# Patient Record
Sex: Female | Born: 1980 | Race: White | Hispanic: No | Marital: Single | Smoking: Former smoker
Health system: Southern US, Community
[De-identification: ages and names within clinical notes are randomized; demographics above are authoritative.]

## PROBLEM LIST (undated history)

## (undated) ENCOUNTER — Inpatient Hospital Stay (HOSPITAL_COMMUNITY): Payer: Self-pay

## (undated) DIAGNOSIS — F419 Anxiety disorder, unspecified: Secondary | ICD-10-CM

## (undated) DIAGNOSIS — J45909 Unspecified asthma, uncomplicated: Secondary | ICD-10-CM

## (undated) HISTORY — PX: BREAST REDUCTION SURGERY: SHX8

---
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
## 2001-02-03 ENCOUNTER — Encounter: Payer: Self-pay | Admitting: Emergency Medicine

## 2001-02-03 ENCOUNTER — Emergency Department (HOSPITAL_COMMUNITY): Admission: EM | Admit: 2001-02-03 | Discharge: 2001-02-03 | Payer: Self-pay | Admitting: Emergency Medicine

## 2001-02-20 ENCOUNTER — Other Ambulatory Visit: Admission: RE | Admit: 2001-02-20 | Discharge: 2001-02-20 | Payer: Self-pay | Admitting: Obstetrics and Gynecology

## 2001-06-17 ENCOUNTER — Inpatient Hospital Stay (HOSPITAL_COMMUNITY): Admission: AD | Admit: 2001-06-17 | Discharge: 2001-06-20 | Payer: Self-pay | Admitting: Obstetrics & Gynecology

## 2001-06-18 ENCOUNTER — Encounter: Payer: Self-pay | Admitting: Obstetrics & Gynecology

## 2001-09-10 ENCOUNTER — Inpatient Hospital Stay (HOSPITAL_COMMUNITY): Admission: AD | Admit: 2001-09-10 | Discharge: 2001-09-10 | Payer: Self-pay | Admitting: Obstetrics & Gynecology

## 2001-09-11 ENCOUNTER — Inpatient Hospital Stay (HOSPITAL_COMMUNITY): Admission: AD | Admit: 2001-09-11 | Discharge: 2001-09-13 | Payer: Self-pay | Admitting: Obstetrics & Gynecology

## 2001-12-31 ENCOUNTER — Encounter: Payer: Self-pay | Admitting: Obstetrics and Gynecology

## 2001-12-31 ENCOUNTER — Inpatient Hospital Stay (HOSPITAL_COMMUNITY): Admission: AD | Admit: 2001-12-31 | Discharge: 2001-12-31 | Payer: Self-pay | Admitting: Obstetrics and Gynecology

## 2002-01-02 ENCOUNTER — Inpatient Hospital Stay (HOSPITAL_COMMUNITY): Admission: AD | Admit: 2002-01-02 | Discharge: 2002-01-02 | Payer: Self-pay | Admitting: *Deleted

## 2002-05-19 ENCOUNTER — Encounter: Admission: RE | Admit: 2002-05-19 | Discharge: 2002-05-19 | Payer: Self-pay | Admitting: *Deleted

## 2004-02-07 ENCOUNTER — Inpatient Hospital Stay (HOSPITAL_COMMUNITY): Admission: AD | Admit: 2004-02-07 | Discharge: 2004-02-07 | Payer: Self-pay | Admitting: Obstetrics & Gynecology

## 2004-03-07 ENCOUNTER — Inpatient Hospital Stay (HOSPITAL_COMMUNITY): Admission: AD | Admit: 2004-03-07 | Discharge: 2004-03-07 | Payer: Self-pay | Admitting: *Deleted

## 2004-08-18 ENCOUNTER — Inpatient Hospital Stay (HOSPITAL_COMMUNITY): Admission: AD | Admit: 2004-08-18 | Discharge: 2004-08-18 | Payer: Self-pay | Admitting: Obstetrics & Gynecology

## 2005-01-08 ENCOUNTER — Inpatient Hospital Stay (HOSPITAL_COMMUNITY): Admission: AD | Admit: 2005-01-08 | Discharge: 2005-01-08 | Payer: Self-pay | Admitting: *Deleted

## 2005-01-12 ENCOUNTER — Inpatient Hospital Stay (HOSPITAL_COMMUNITY): Admission: AD | Admit: 2005-01-12 | Discharge: 2005-01-12 | Payer: Self-pay | Admitting: *Deleted

## 2005-03-13 ENCOUNTER — Emergency Department (HOSPITAL_COMMUNITY): Admission: EM | Admit: 2005-03-13 | Discharge: 2005-03-13 | Payer: Self-pay | Admitting: Emergency Medicine

## 2005-04-02 ENCOUNTER — Inpatient Hospital Stay (HOSPITAL_COMMUNITY): Admission: AD | Admit: 2005-04-02 | Discharge: 2005-04-03 | Payer: Self-pay | Admitting: Family Medicine

## 2005-09-23 ENCOUNTER — Inpatient Hospital Stay (HOSPITAL_COMMUNITY): Admission: AD | Admit: 2005-09-23 | Discharge: 2005-09-23 | Payer: Self-pay | Admitting: Obstetrics and Gynecology

## 2005-12-09 ENCOUNTER — Inpatient Hospital Stay (HOSPITAL_COMMUNITY): Admission: AD | Admit: 2005-12-09 | Discharge: 2005-12-09 | Payer: Self-pay | Admitting: Obstetrics & Gynecology

## 2006-04-08 ENCOUNTER — Inpatient Hospital Stay (HOSPITAL_COMMUNITY): Admission: AD | Admit: 2006-04-08 | Discharge: 2006-04-08 | Payer: Self-pay | Admitting: Obstetrics & Gynecology

## 2006-04-09 ENCOUNTER — Emergency Department (HOSPITAL_COMMUNITY): Admission: EM | Admit: 2006-04-09 | Discharge: 2006-04-09 | Payer: Self-pay | Admitting: Emergency Medicine

## 2006-05-08 ENCOUNTER — Emergency Department (HOSPITAL_COMMUNITY): Admission: EM | Admit: 2006-05-08 | Discharge: 2006-05-08 | Payer: Self-pay | Admitting: Family Medicine

## 2006-07-15 ENCOUNTER — Inpatient Hospital Stay (HOSPITAL_COMMUNITY): Admission: AD | Admit: 2006-07-15 | Discharge: 2006-07-15 | Payer: Self-pay | Admitting: Obstetrics & Gynecology

## 2006-07-23 ENCOUNTER — Inpatient Hospital Stay (HOSPITAL_COMMUNITY): Admission: AD | Admit: 2006-07-23 | Discharge: 2006-07-23 | Payer: Self-pay | Admitting: Obstetrics and Gynecology

## 2006-10-02 ENCOUNTER — Emergency Department (HOSPITAL_COMMUNITY): Admission: EM | Admit: 2006-10-02 | Discharge: 2006-10-02 | Payer: Self-pay | Admitting: Emergency Medicine

## 2006-10-08 ENCOUNTER — Emergency Department (HOSPITAL_COMMUNITY): Admission: EM | Admit: 2006-10-08 | Discharge: 2006-10-08 | Payer: Self-pay | Admitting: Family Medicine

## 2007-01-14 ENCOUNTER — Emergency Department: Payer: Self-pay | Admitting: Internal Medicine

## 2007-05-14 ENCOUNTER — Emergency Department (HOSPITAL_COMMUNITY): Admission: EM | Admit: 2007-05-14 | Discharge: 2007-05-14 | Payer: Self-pay | Admitting: Family Medicine

## 2008-08-30 ENCOUNTER — Emergency Department (HOSPITAL_COMMUNITY): Admission: EM | Admit: 2008-08-30 | Discharge: 2008-08-30 | Payer: Self-pay | Admitting: Family Medicine

## 2008-08-31 ENCOUNTER — Inpatient Hospital Stay (HOSPITAL_COMMUNITY): Admission: AD | Admit: 2008-08-31 | Discharge: 2008-08-31 | Payer: Self-pay | Admitting: Obstetrics & Gynecology

## 2008-09-03 ENCOUNTER — Inpatient Hospital Stay (HOSPITAL_COMMUNITY): Admission: AD | Admit: 2008-09-03 | Discharge: 2008-09-03 | Payer: Self-pay | Admitting: Obstetrics & Gynecology

## 2008-10-04 ENCOUNTER — Emergency Department (HOSPITAL_COMMUNITY): Admission: EM | Admit: 2008-10-04 | Discharge: 2008-10-04 | Payer: Self-pay | Admitting: Family Medicine

## 2008-10-06 ENCOUNTER — Emergency Department (HOSPITAL_COMMUNITY): Admission: EM | Admit: 2008-10-06 | Discharge: 2008-10-06 | Payer: Self-pay | Admitting: Emergency Medicine

## 2008-11-06 ENCOUNTER — Emergency Department (HOSPITAL_COMMUNITY): Admission: EM | Admit: 2008-11-06 | Discharge: 2008-11-07 | Payer: Self-pay | Admitting: Emergency Medicine

## 2008-12-20 ENCOUNTER — Emergency Department (HOSPITAL_COMMUNITY): Admission: EM | Admit: 2008-12-20 | Discharge: 2008-12-20 | Payer: Self-pay | Admitting: Family Medicine

## 2009-04-29 ENCOUNTER — Emergency Department (HOSPITAL_COMMUNITY): Admission: EM | Admit: 2009-04-29 | Discharge: 2009-04-29 | Payer: Self-pay | Admitting: Family Medicine

## 2009-05-10 ENCOUNTER — Emergency Department (HOSPITAL_COMMUNITY): Admission: EM | Admit: 2009-05-10 | Discharge: 2009-05-10 | Payer: Self-pay | Admitting: Family Medicine

## 2009-11-07 ENCOUNTER — Encounter: Admission: RE | Admit: 2009-11-07 | Discharge: 2009-11-07 | Payer: Self-pay | Admitting: Family Medicine

## 2010-04-25 ENCOUNTER — Encounter: Admission: RE | Admit: 2010-04-25 | Discharge: 2010-04-25 | Payer: Self-pay | Admitting: Family Medicine

## 2010-04-27 ENCOUNTER — Emergency Department (HOSPITAL_COMMUNITY): Admission: EM | Admit: 2010-04-27 | Discharge: 2010-04-27 | Payer: Self-pay | Admitting: Emergency Medicine

## 2010-04-30 ENCOUNTER — Emergency Department (HOSPITAL_COMMUNITY): Admission: EM | Admit: 2010-04-30 | Discharge: 2010-04-30 | Payer: Self-pay | Admitting: Family Medicine

## 2010-05-11 ENCOUNTER — Emergency Department (HOSPITAL_COMMUNITY): Admission: EM | Admit: 2010-05-11 | Discharge: 2010-05-11 | Payer: Self-pay | Admitting: Emergency Medicine

## 2010-08-13 HISTORY — PX: BREAST BIOPSY: SHX20

## 2010-08-28 ENCOUNTER — Encounter
Admission: RE | Admit: 2010-08-28 | Discharge: 2010-08-28 | Payer: Self-pay | Source: Home / Self Care | Attending: Family Medicine | Admitting: Family Medicine

## 2010-11-21 LAB — WET PREP, GENITAL
Trich, Wet Prep: NONE SEEN
Yeast Wet Prep HPF POC: NONE SEEN

## 2010-11-21 LAB — GC/CHLAMYDIA PROBE AMP, GENITAL
Chlamydia, DNA Probe: NEGATIVE
GC Probe Amp, Genital: NEGATIVE

## 2010-11-21 LAB — POCT URINALYSIS DIP (DEVICE)
Bilirubin Urine: NEGATIVE
Glucose, UA: NEGATIVE mg/dL
Ketones, ur: NEGATIVE mg/dL
Nitrite: NEGATIVE

## 2010-11-27 LAB — GC/CHLAMYDIA PROBE AMP, GENITAL: GC Probe Amp, Genital: NEGATIVE

## 2010-11-27 LAB — POCT URINALYSIS DIP (DEVICE)
Bilirubin Urine: NEGATIVE
Glucose, UA: NEGATIVE mg/dL
Protein, ur: 30 mg/dL — AB
Specific Gravity, Urine: 1.015 (ref 1.005–1.030)
Urobilinogen, UA: 1 mg/dL (ref 0.0–1.0)
pH: 7 (ref 5.0–8.0)

## 2010-11-27 LAB — RPR: RPR Ser Ql: NONREACTIVE

## 2010-11-27 LAB — HCG, QUANTITATIVE, PREGNANCY: hCG, Beta Chain, Quant, S: 35 m[IU]/mL — ABNORMAL HIGH (ref <5)

## 2010-11-28 LAB — STREP A DNA PROBE

## 2010-12-29 NOTE — Discharge Summary (Signed)
[facility]  Patient:    Michelle Rhodes, Michelle Rhodes Visit Number: [id number] MRN: [id number]          Service Type: OBS Location: [id number] Attending Physician:  Mickle Mallory Dictated by:   Gerrit Friends. Aldona Bar, M.D. Admit Date:  06/17/2001 Discharge Date: 06/20/2001                             Discharge Summary  DISCHARGE DIAGNOSES: 1. A 26 week intrauterine pregnancy, undelivered. 2. Right pyelonephritis, resolved.  HISTORY OF PRESENT ILLNESS:  This 30 year old, gravida 1, para 0, was admitted at [redacted] weeks gestation with right costoverebral angle tenderness, worse over the preceding three days, with an infected appearing urine, and a presumptive diagnosis of right pyelonephritis.  She was admitted and placed on intravenous Ancef.  Her initial white count was 17,800, and her urine was grossly infected, and a culture was requested.  Unfortunately, the culture results never made it back to the chart, and in spite of multiple inquiries from the laboratory, the culture has not been able to be located.  Nonetheless, the patient did improve clinically.  Her white count dropped to 10,000 on the morning of 11/7, and her differential likewise improved, initially there was a shift to the left, and on 06/19/01, the differential was normal.  An ultrasound was done which revealed the pregnancy to be doing well, sizes were equivalent to dates, and amniotic fluid volume was normal.  The kidneys were looked at during the ultrasound, and the ultrasonography had the impression that there was a moderate hydronephrosis on the right greater than expected for 26 weeks, and no right ureteral jet was seen, suggesting the possibility of a right ureteral stone or obstruction.  There was no hydronephrosis noted on the left.  Dr. Isabel Caprice was consulted by Dr. Edward Jolly. Being that the patient was improving and was stable, labs were checked and the BUN and creatinine were noted to be  normal again, and the patient continued to improve, and therefore no further workup was deemed necessary according to the phone conversation with Dr. Isabel Caprice that Dr. Edward Jolly had with him on 06/18/01.  On the morning of 11/8, the patient related that her pain was almost entirely gone, she was afebrile, her vital signs were stable, and she was definitely ready for discharge.  Accordingly, she was given a prescription for Keflex 500 mg t.i.d. x 7 days.  It was requested of her to drink plenty of fluids, and return to the office in approximately 4 to 5 days for further evaluation, or to the hospital if something should change adversely in the meantime.  Once she is evaluated again in the office, consideration will be given to keeping her on antibiotics, probably Macrobid, probably for the rest of her pregnancy.  CONDITION ON DISCHARGE:  Improved. Dictated by:   Gerrit Friends. Aldona Bar, M.D. Attending Physician:  Mickle Mallory DD:  06/20/01 TD:  06/22/01 Job: [id number]

## 2010-12-29 NOTE — Op Note (Signed)
[facility]  Patient:    GRIER, CZERWINSKI Visit Number: [id number] MRN: [id number]          Service Type: OBS Location: [facility] Attending Physician:  Mickle Mallory Dictated by:   Devoria Albe Edward Jolly, M.D. Proc. Date: 09/11/01 Admit Date:  09/10/2001 Discharge Date: 09/10/2001                             Operative Report  PREOPERATIVE DIAGNOSES:       1. Intrauterine gestation at 38+4 weeks.                               2. Moderate to severe variable decelerations.  POSTOPERATIVE DIAGNOSES:      1. Intrauterine gestation at 38+4 weeks.                               2. Moderate to severe fetal variable                                  decelerations.  SURGEON:                      Brook A. Edward Jolly, M.D.  ANESTHESIA:                   Epidural, local with 1% lidocaine.  ESTIMATED BLOOD LOSS:         Less than 500 cc.  COMPLICATIONS:                None.  INDICATIONS:                  The patient was a 30 year old gravida 4, para 0-0-3-0 Caucasian female at 38+ [redacted] weeks gestation who was admitted in early labor on the morning of September 11, 2001 with a cervix that was 1 cm dilated and 100% effaced.  Shortly after admission an IV was started and the patient went on to then receive Stadol 1 mg intravenously for discomfort related to contractions which were noted to be approximately every 10 minutes.  The patients cervix was later examined and found to be 1-2 cm dilated with again the effacement at 100% and the fetal heart rate tracing was reassuring at this time with reactivity.  There was scalp stimulation appreciated.  There was a suggestion of a course ______ pattern of the fetal heart rate pattern noted after the administration of the Stadol.  The patients contractions were irregular at this time and of increased strength.  Artificial rupture of membranes was performed for augmentation of labor and for placement of an internal fetal scalp  electrode.  The amniotic fluid was noted to be clear.  The patient went on to receive an epidural for anesthesia and she rapidly progressed to the active phase of labor and 7 cm of cervical dilatation.  The patient then went on to achieve complete dilation of the cervix at which time she began to push with good maternal effort.  The patient was able to bring the fetal vertex down to a +3 station at which time moderate to severe fetal variable decelerations were appreciated and a recommendation was made to proceed with a vacuum assisted vaginal delivery.  Risks and benefits were reviewed and  the patient and her family chose to proceed.  FINDINGS:                     Viable female infant was delivered at 1337 in the occiput anterior position with Apgars of 9 at one minute and 9 at five minutes.  The infant was noted to be vigorous at birth.  There was a false knot appreciated in the umbilical cord which had a normal insertion.  The umbilical cord had three vessels.  The placenta was noted to be intact.  The cervix and vagina had no lacerations.  There was a midline episiotomy.  PROCEDURE:                     The patient was examined and the vertex was found to be in the occiput anterior position at the 3+ station.  With the bladder just previously emptied, the MityVac was placed over the fetal vertex. The vertex was delivered over three contractions and the pressure was maintained within the proper range of 15 mmHg only during the maternal efforts with pushing.  The vacuum was released completely between contractions.  The fetal vertex was delivered without difficulty followed by the remainder of the infant.  The cord was doubly clamped and cut after the nares and mouth were suctioned.  The newborn was carried over to the warmer in vigorous condition at this time.  Cord blood was obtained.  The episiotomy repair was performed in standard fashion with a combination of 2-0 and 0 Vicryl after  local injection with 1% Marcaine.  The placenta was spontaneously delivered.  The patient did receive Pitocin 20 mg intravenously.  There were no complications to the procedure.  All needle, instrument, and sponge counts were correct. Dictated by:   Devoria Albe Edward Jolly, M.D. Attending Physician:  Mickle Mallory DD:  09/11/01 TD:  09/12/01 Job: [id number]

## 2011-12-27 IMAGING — US US PELVIS COMPLETE
1 series · 14 of 25 positions shown · non-contrast
Comparison: None

CLINICAL DATA: Left lower quadrant pain.

TRANSABDOMINAL AND TRANSVAGINAL ULTRASOUND OF PELVIS
TECHNIQUE: Both transabdominal and transvaginal ultrasound
examinations of the pelvis were performed including evaluation of
the uterus, ovaries, adnexal regions, and pelvic cul-de-sac.

[Series 1: us pelvis complete · 0.23mm/px · 14 of 55 slices shown]
[im 1/55]
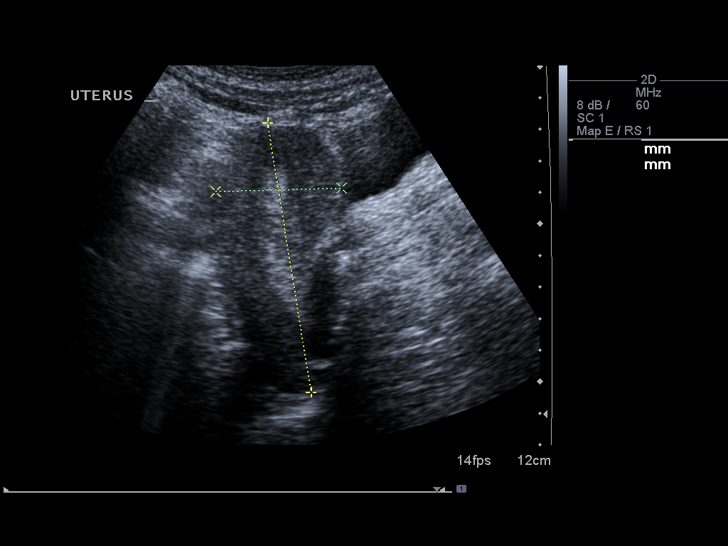
[im 5/55]
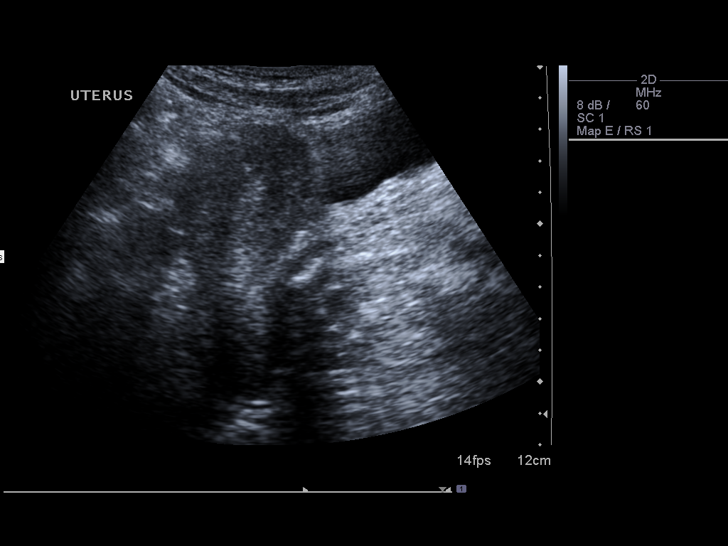
[im 10/55]
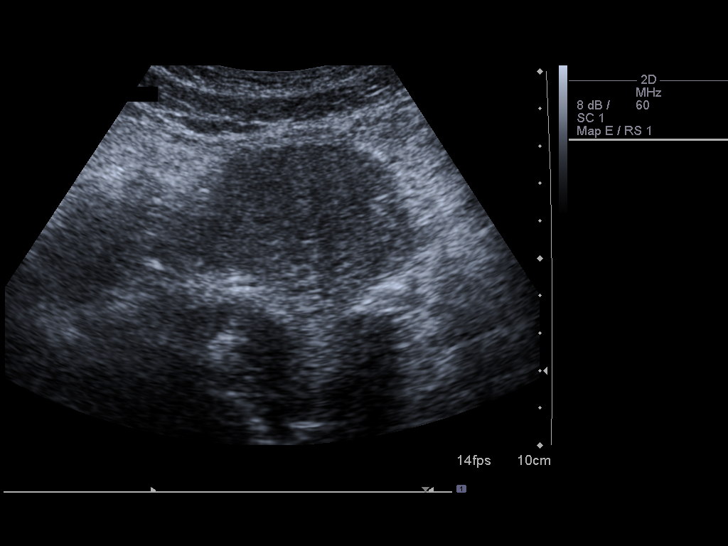
[im 14/55]
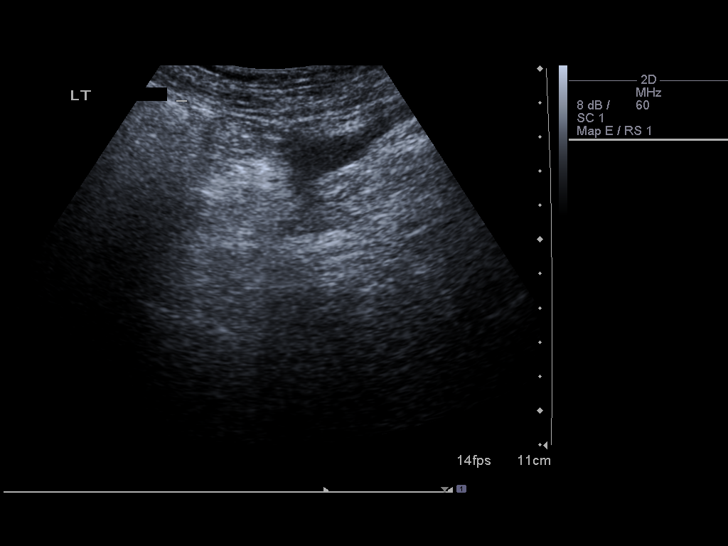
[im 19/55]
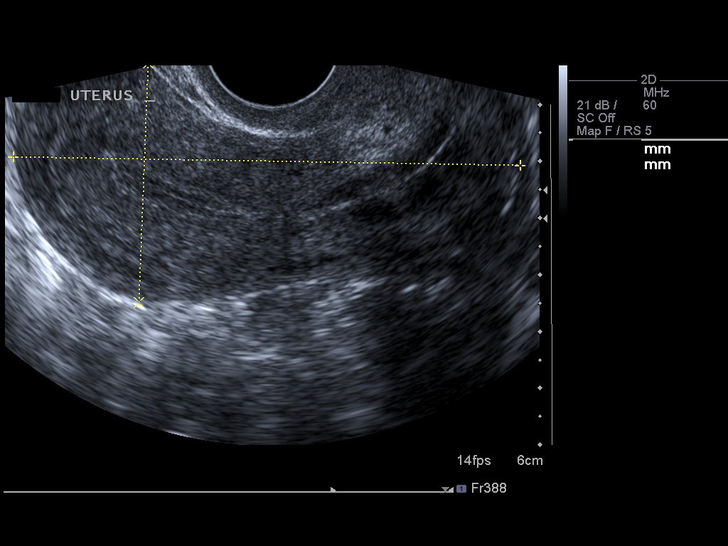
[im 21/55]
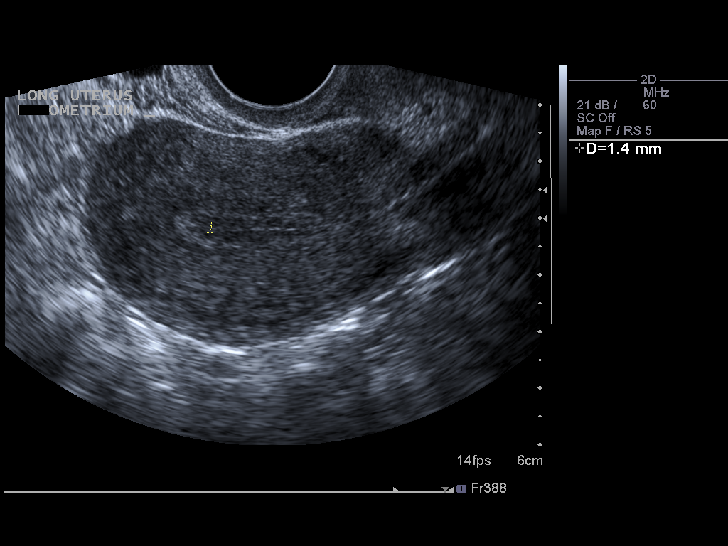
[im 25/55]
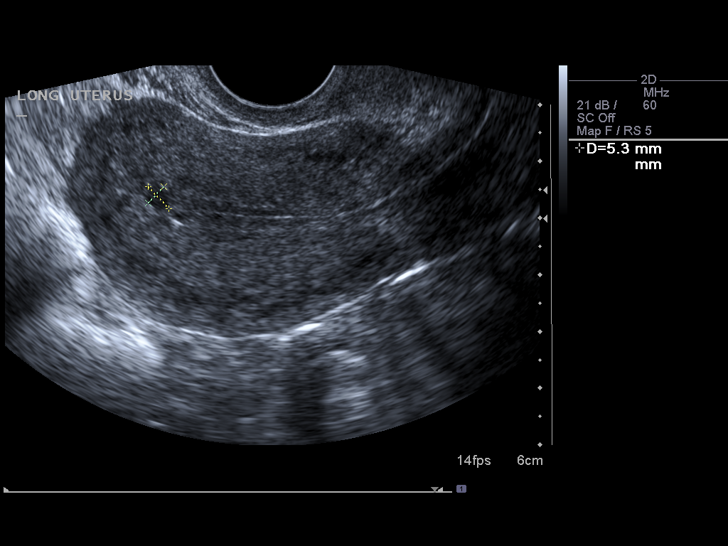
[im 30/55]
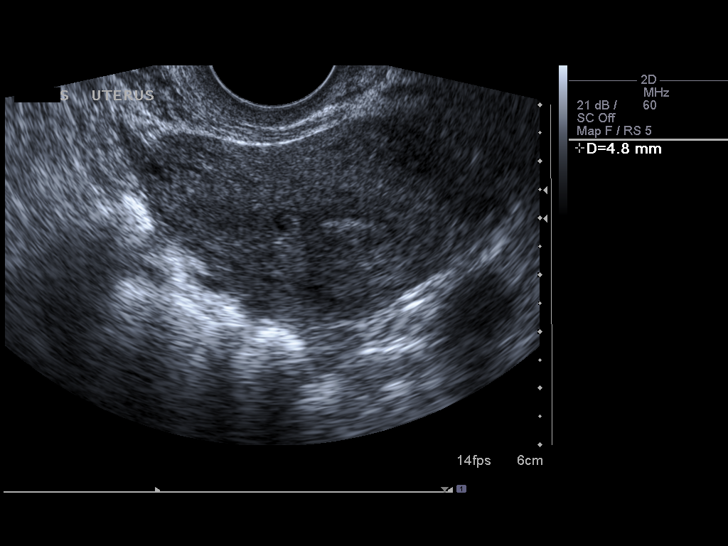
[im 34/55]
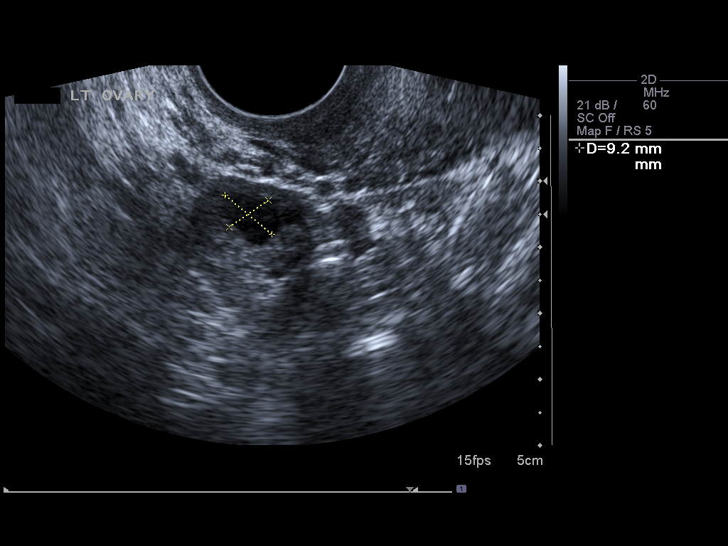
[im 37/55]
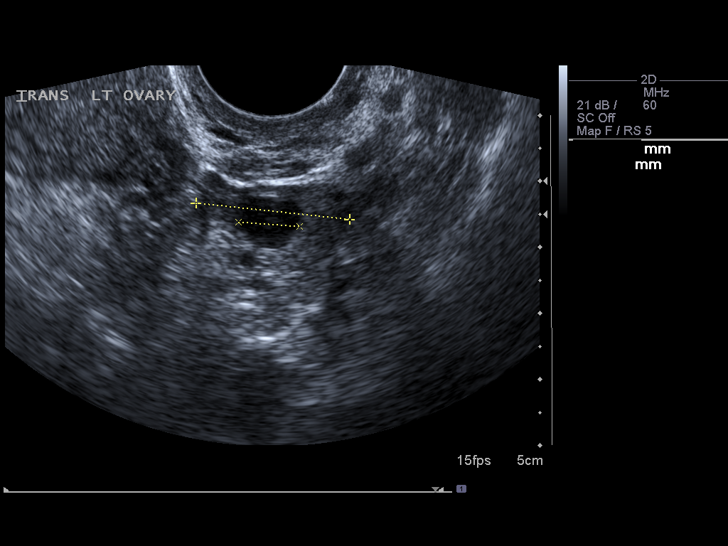
[im 41/55]
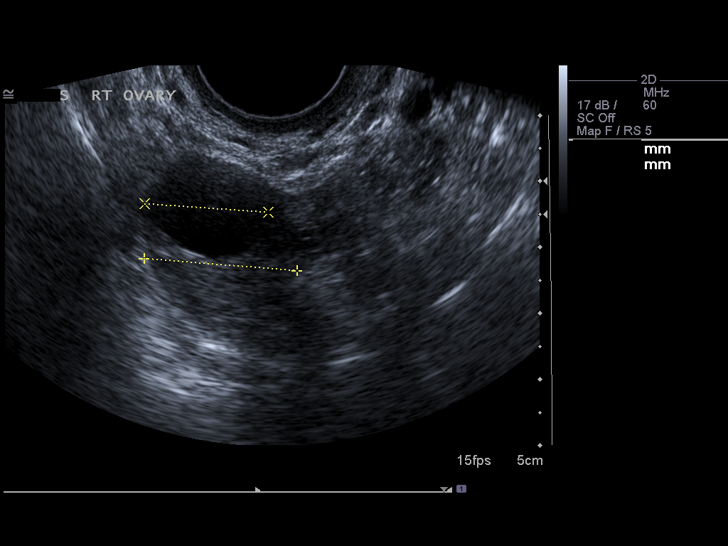
[im 46/55]
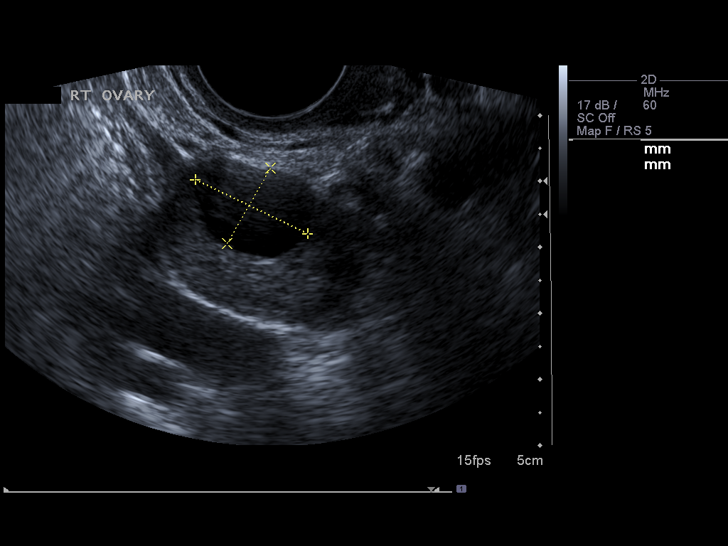
[im 50/55]
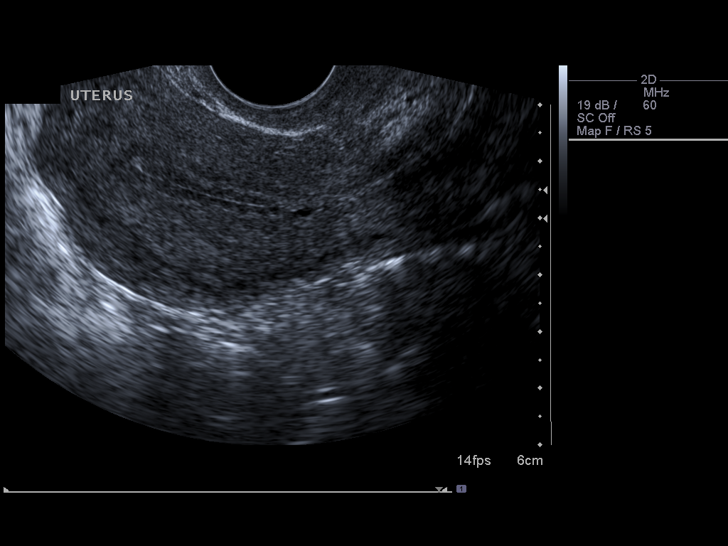
[im 55/55]
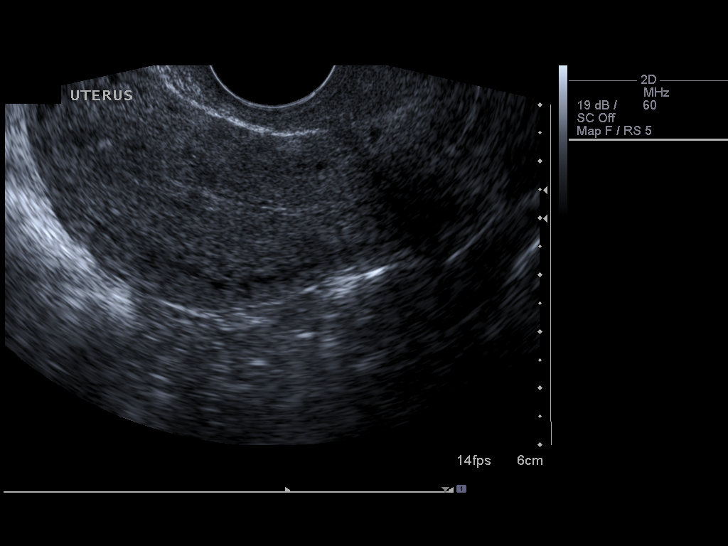

[14 of 25 positions shown; findings below may reference images not displayed]

FINDINGS: Uterus: 8.7 x 4.0 x 4.9.  Tiny hypoechoic fibroid measuring 5 mm.
Uterine echotexture is unremarkable.

Endometrium: The endometrium, 14 mm.

Right Ovary: 3.3 x 2.4 x 2.3 cm.  1.9 cm follicle present.

Left Ovary: 1.8 x 1.8 x 2.3 cm.  Small follicle, less than 1 cm.

Other Findings:  No free fluid.
IMPRESSION: Tiny submucosal fibroid, 5 mm.

Bilateral ovarian follicles.

No acute findings.

## 2012-01-02 ENCOUNTER — Encounter (HOSPITAL_COMMUNITY): Payer: Self-pay | Admitting: *Deleted

## 2012-01-02 ENCOUNTER — Emergency Department (HOSPITAL_COMMUNITY)
Admission: EM | Admit: 2012-01-02 | Discharge: 2012-01-02 | Disposition: A | Discharge status: Home / Self Care | Payer: Medicaid Other | Attending: Emergency Medicine | Admitting: Emergency Medicine

## 2012-01-02 DIAGNOSIS — F17200 Nicotine dependence, unspecified, uncomplicated: Secondary | ICD-10-CM | POA: Insufficient documentation

## 2012-01-02 DIAGNOSIS — G35 Multiple sclerosis: Secondary | ICD-10-CM | POA: Insufficient documentation

## 2012-01-02 DIAGNOSIS — G43909 Migraine, unspecified, not intractable, without status migrainosus: Secondary | ICD-10-CM | POA: Insufficient documentation

## 2012-01-02 MED ORDER — PROMETHAZINE HCL 25 MG/ML IJ SOLN
25.0000 mg | Freq: Once | INTRAMUSCULAR | Status: AC
  Administered 2012-01-02: 25 mg via INTRAMUSCULAR
  Filled 2012-01-02: qty 1

## 2012-01-02 MED ORDER — SUMATRIPTAN SUCCINATE 6 MG/0.5ML ~~LOC~~ SOLN
6.0000 mg | Freq: Once | SUBCUTANEOUS | Status: AC
  Administered 2012-01-02: 6 mg via SUBCUTANEOUS
  Filled 2012-01-02: qty 0.5

## 2012-01-02 MED ORDER — SUMATRIPTAN SUCCINATE 6 MG/0.5ML ~~LOC~~ SOLN: 6.0000 mg | SUBCUTANEOUS | Status: DC | PRN

## 2012-01-02 NOTE — ED Notes (Signed)
Patient reports she has hx of migraines,  She has had a headache today since 0800.  She has taken advil and bc w/o relief.

## 2012-01-02 NOTE — Discharge Instructions (Signed)
Get plenty of rest. Follow up with your doctor. Return if worsening.   Migraine Headache A migraine headache is an intense, throbbing pain on one or both sides of your head. The exact cause of a migraine headache is not always known. A migraine may be caused when nerves in the brain become irritated and release chemicals that cause swelling within blood vessels, causing pain. Many migraine sufferers have a family history of migraines. Before you get a migraine you may or may not get an aura. An aura is a group of symptoms that can predict the beginning of a migraine. An aura may include:  Visual changes such as:   Flashing lights.   Bright spots or zig-zag lines.   Tunnel vision.   Feelings of numbness.   Trouble talking.   Muscle weakness.  SYMPTOMS  Pain on one or both sides of your head.   Pain that is pulsating or throbbing in nature.   Pain that is severe enough to prevent daily activities.   Pain that is aggravated by any daily physical activity.   Nausea (feeling sick to your stomach), vomiting, or both.   Pain with exposure to bright lights, loud noises, or activity.   General sensitivity to bright lights or loud noises.  MIGRAINE TRIGGERS Examples of triggers of migraine headaches include:   Alcohol.   Smoking.   Stress.   It may be related to menses (female menstruation).   Aged cheeses.   Foods or drinks that contain nitrates, glutamate, aspartame, or tyramine.   Lack of sleep.   Chocolate.   Caffeine.   Hunger.   Medications such as nitroglycerine (used to treat chest pain), birth control pills, estrogen, and some blood pressure medications.  DIAGNOSIS  A migraine headache is often diagnosed based on:  Symptoms.   Physical examination.   A computerized X-ray scan (computed tomography, CT) of your head.  TREATMENT  Medications can help prevent migraines if they are recurrent or should they become recurrent. Your caregiver can help you with  a medication or treatment program that will be helpful to you.   Lying down in a dark, quiet room may be helpful.   Keeping a headache diary may help you find a trend as to what may be triggering your headaches.  SEEK IMMEDIATE MEDICAL CARE IF:   You have confusion, personality changes or seizures.   You have headaches that wake you from sleep.   You have an increased frequency in your headaches.   You have a stiff neck.   You have a loss of vision.   You have muscle weakness.   You start losing your balance or have trouble walking.   You feel faint or pass out.  MAKE SURE YOU:   Understand these instructions.   Will watch your condition.   Will get help right away if you are not doing well or get worse.  Document Released: 07/30/2005 Document Revised: 07/19/2011 Document Reviewed: 03/15/2009 [facility] Patient Information 2012 [place], [place].

## 2012-01-02 NOTE — ED Provider Notes (Signed)
Medical screening examination/treatment/procedure(s) were performed by non-physician practitioner and as supervising physician I was immediately available for consultation/collaboration.   Joya Gaskins, MD 01/02/12 [phone]

## 2012-01-02 NOTE — ED Notes (Signed)
Discharged home with prescription and written instructions with significant other, steady gait.

## 2012-01-02 NOTE — ED Provider Notes (Signed)
History     CSN: [id number]  Arrival date & time 01/02/12  1024   First MD Initiated Contact with Patient 01/02/12 1105      Chief Complaint  Patient presents with  . Migraine    (Consider location/radiation/quality/duration/timing/severity/associated sxs/prior treatment) Patient is a 31 y.o. female presenting with migraine. The history is provided by the patient.  Migraine This is a new problem. The current episode started today. The problem occurs constantly. The problem has been unchanged. Associated symptoms include headaches. Pertinent negatives include no chills, congestion, fatigue, fever, joint swelling, rash, sore throat or visual change. Exacerbated by: light.  Pt states she has hx of migraines, usually takes imitrex injections at home but states she ran out. This headache began this morning. Pt woke up with it. States feels like her usual migraine. Took goody headache and ibuprofen with no relief. Pt denies fever, chills, URI symptoms, weakness or numbness of extremities. Does admit to nausea.  Past Medical History  Diagnosis Date  . Migraine   . Multiple sclerosis     History reviewed. No pertinent past surgical history.  No family history on file.  History  Substance Use Topics  . Smoking status: Current Everyday Smoker  . Smokeless tobacco: Not on file  . Alcohol Use: No    OB History    Grav Para Term Preterm Abortions TAB SAB Ect Mult Living                  Review of Systems  Constitutional: Negative for fever, chills and fatigue.  HENT: Negative for ear pain, congestion, sore throat, facial swelling, neck stiffness and sinus pressure.   Eyes: Positive for photophobia. Negative for visual disturbance.  Respiratory: Negative.   Cardiovascular: Negative.   Gastrointestinal: Negative.   Musculoskeletal: Negative.  Negative for joint swelling.  Skin: Negative for rash.  Neurological: Positive for headaches. Negative for dizziness and light-headedness.      Allergies  Review of patient's allergies indicates no known allergies.  Home Medications   Current Outpatient Rx  Name Route Sig Dispense Refill  . GOODY HEADACHE PO Oral Take 1 packet by mouth daily as needed. For pain    . CETIRIZINE HCL 10 MG PO TABS Oral Take 10 mg by mouth daily.    . IBUPROFEN 200 MG PO TABS Oral Take 400 mg by mouth every 6 (six) hours as needed. For pain      BP 100/66  Pulse 66  Temp(Src) 98.3 F (36.8 C) (Oral)  Resp 16  SpO2 100%  Physical Exam  Nursing note and vitals reviewed. Constitutional: She is oriented to person, place, and time. She appears well-developed and well-nourished.       Uncomfortable appearing  HENT:  Head: Normocephalic and atraumatic.  Right Ear: External ear normal.  Left Ear: External ear normal.  Nose: Nose normal.  Mouth/Throat: Oropharynx is clear and moist.  Eyes: Conjunctivae and EOM are normal. Pupils are equal, round, and reactive to light.  Neck: Normal range of motion. Neck supple.  Cardiovascular: Normal rate, regular rhythm and normal heart sounds.   Pulmonary/Chest: Effort normal and breath sounds normal. No respiratory distress. She has no wheezes. She has no rales.  Musculoskeletal: Normal range of motion. She exhibits no tenderness.  Neurological: She is alert and oriented to person, place, and time. She has normal reflexes. No cranial nerve deficit. She exhibits normal muscle tone.  Skin: Skin is warm and dry.  Psychiatric: She has a normal mood and  affect.    ED Course  Procedures (including critical care time)  Pt with typical migraine, out of imitrex at home. Will order some here.   Pt received imitrex and phenergan. Pt feeling better, wants to be discharged home. She is neurovascularly intact. Onset of pain upon awakening. She denies any neuro deficits. No fever or neck stiffness. Will d/c home with follow up as needed    1. Migraine headache       MDM          Lottie Mussel, PA 01/02/12 1549

## 2012-01-10 ENCOUNTER — Emergency Department (HOSPITAL_COMMUNITY)
Admission: EM | Admit: 2012-01-10 | Discharge: 2012-01-10 | Disposition: A | Discharge status: Home / Self Care | Payer: Medicaid Other | Attending: Emergency Medicine | Admitting: Emergency Medicine

## 2012-01-10 ENCOUNTER — Encounter (HOSPITAL_COMMUNITY): Payer: Self-pay | Admitting: *Deleted

## 2012-01-10 DIAGNOSIS — R51 Headache: Secondary | ICD-10-CM | POA: Insufficient documentation

## 2012-01-10 DIAGNOSIS — F17200 Nicotine dependence, unspecified, uncomplicated: Secondary | ICD-10-CM | POA: Insufficient documentation

## 2012-01-10 MED ORDER — OXYCODONE-ACETAMINOPHEN 5-325 MG PO TABS
1.0000 | ORAL_TABLET | Freq: Once | ORAL | Status: AC
  Administered 2012-01-10: 1 via ORAL
  Filled 2012-01-10: qty 1

## 2012-01-10 MED ORDER — SUMATRIPTAN SUCCINATE 6 MG/0.5ML ~~LOC~~ SOLN
6.0000 mg | Freq: Once | SUBCUTANEOUS | Status: AC
  Administered 2012-01-10: 6 mg via SUBCUTANEOUS
  Filled 2012-01-10: qty 0.5

## 2012-01-10 NOTE — ED Provider Notes (Signed)
History     CSN: [id number]  Arrival date & time 01/10/12  0002   First MD Initiated Contact with Patient 01/10/12 [phone]      Chief Complaint  Patient presents with  . Migraine    (Consider location/radiation/quality/duration/timing/severity/associated sxs/prior treatment) Patient is a 31 y.o. female presenting with migraine. The history is provided by the patient (pt states she is having her typical migraine). No language interpreter was used.  Migraine This is a new problem. The current episode started 12 to 24 hours ago. The problem occurs constantly. Pertinent negatives include no chest pain, no abdominal pain and no headaches. The symptoms are aggravated by nothing. The symptoms are relieved by nothing. She has tried nothing for the symptoms. The treatment provided no relief.    Past Medical History  Diagnosis Date  . Migraine   . Multiple sclerosis     History reviewed. No pertinent past surgical history.  No family history on file.  History  Substance Use Topics  . Smoking status: Current Everyday Smoker  . Smokeless tobacco: Not on file  . Alcohol Use: No    OB History    Grav Para Term Preterm Abortions TAB SAB Ect Mult Living                  Review of Systems  Constitutional: Negative for fatigue.  HENT: Negative for congestion, sinus pressure and ear discharge.   Eyes: Negative for discharge.  Respiratory: Negative for cough.   Cardiovascular: Negative for chest pain.  Gastrointestinal: Negative for abdominal pain and diarrhea.  Genitourinary: Negative for frequency and hematuria.  Musculoskeletal: Negative for back pain.  Skin: Negative for rash.  Neurological: Negative for seizures and headaches.  Hematological: Negative.   Psychiatric/Behavioral: Negative for hallucinations.    Allergies  Review of patient's allergies indicates no known allergies.  Home Medications   Current Outpatient Rx  Name Route Sig Dispense Refill  . SUMATRIPTAN  SUCCINATE 6 MG/0.5ML Mabel SOLN Subcutaneous Inject 0.5 mLs (6 mg total) into the skin every 2 (two) hours as needed for migraine or headache. F 2 vial 0    BP 118/87  Pulse 62  Temp(Src) 98.3 F (36.8 C) (Oral)  Resp 18  SpO2 100%  Physical Exam  Constitutional: She is oriented to person, place, and time. She appears well-developed.  HENT:  Head: Normocephalic and atraumatic.  Eyes: Conjunctivae and EOM are normal. No scleral icterus.  Neck: Neck supple. No thyromegaly present.  Cardiovascular: Normal rate and regular rhythm.  Exam reveals no gallop and no friction rub.   No murmur heard. Pulmonary/Chest: No stridor. She has no wheezes. She has no rales. She exhibits no tenderness.  Abdominal: She exhibits no distension. There is no tenderness. There is no rebound.  Musculoskeletal: Normal range of motion. She exhibits no edema.  Lymphadenopathy:    She has no cervical adenopathy.  Neurological: She is oriented to person, place, and time. Coordination normal.  Skin: No rash noted. No erythema.  Psychiatric: She has a normal mood and affect. Her behavior is normal.    ED Course  Procedures (including critical care time)  Labs Reviewed - No data to display No results found.   1. Headache       MDM          Benny Lennert, MD 01/10/12 [phone]

## 2012-01-10 NOTE — ED Notes (Signed)
Pt with hx of migraines, was seen here last week and tx for a migraine.  Pt usually gives herself imitrex shots and they work, but she is out of them.  Pt photophobic and appears in great pain.

## 2012-01-10 NOTE — Discharge Instructions (Signed)
Follow up as needed

## 2012-01-16 ENCOUNTER — Emergency Department: Payer: Self-pay | Admitting: *Deleted

## 2012-01-17 LAB — PREGNANCY, URINE: Pregnancy Test, Urine: NEGATIVE m[IU]/mL

## 2012-01-20 ENCOUNTER — Encounter (HOSPITAL_COMMUNITY): Payer: Self-pay | Admitting: Emergency Medicine

## 2012-01-20 ENCOUNTER — Emergency Department (HOSPITAL_COMMUNITY)
Admission: EM | Admit: 2012-01-20 | Discharge: 2012-01-20 | Disposition: A | Discharge status: Home / Self Care | Payer: Medicaid Other | Attending: Emergency Medicine | Admitting: Emergency Medicine

## 2012-01-20 DIAGNOSIS — R51 Headache: Secondary | ICD-10-CM

## 2012-01-20 DIAGNOSIS — F17200 Nicotine dependence, unspecified, uncomplicated: Secondary | ICD-10-CM | POA: Insufficient documentation

## 2012-01-20 MED ORDER — SUMATRIPTAN SUCCINATE 100 MG PO TABS: 100.0000 mg | ORAL_TABLET | ORAL | Status: DC | PRN

## 2012-01-20 MED ORDER — DIAZEPAM 5 MG PO TABS
5.0000 mg | ORAL_TABLET | Freq: Once | ORAL | Status: AC
  Administered 2012-01-20: 5 mg via ORAL
  Filled 2012-01-20: qty 1

## 2012-01-20 MED ORDER — SUMATRIPTAN SUCCINATE 6 MG/0.5ML ~~LOC~~ SOLN
6.0000 mg | Freq: Once | SUBCUTANEOUS | Status: AC
  Administered 2012-01-20: 6 mg via SUBCUTANEOUS
  Filled 2012-01-20: qty 0.5

## 2012-01-20 NOTE — ED Notes (Signed)
Pt c/o headache intermittent x 2 weeks L sided, radiating to neck, denies n/v. Pt seen at [facility] x 2 and [facility] yesterday for same, had Head CT yesterday. Neg.

## 2012-01-20 NOTE — ED Notes (Signed)
Pt. Present to the ED with significant other with c/o HA . She states that the headaches had onset x 1 month. Today's HA onset today at 3pm. Denies N/V But has sensitivity to light and sound. She is sitting in the room with the light off.

## 2012-01-20 NOTE — ED Provider Notes (Signed)
History     CSN: [id number]  Arrival date & time 01/20/12  1859   First MD Initiated Contact with Patient 01/20/12 2016      Chief Complaint  Patient presents with  . Headache   HPI  History provided by the patient and spouse. Patient is 31 year old female with history of migraine headaches and possible multiple sclerosis who presents with complaints of typical migraine headache. Patient has been or had some games earlier today around 4 PM patient began to complain of the start of left-sided migraine headache. Patient doses of Fioricet without improvement. Headache gradually became worse with improvements. Headache is on the left side of the head with some radiation into the neck. Symptoms are worse with bright lights or loud noise. Those are typical of previous migraine headaches. Patient usually receives Imitrex for these headaches with improvement. Patient was previously on a prescription for Imitrex injections but could not afford this has not used this recently. Patient also reports having a possible diagnosis of MS recently. She has continued followup with her neurologist this coming Tuesday. She does not complain of any weakness in extremities. Symptoms are not associated with any other complaints. Patient denies any fever, chills, sweats, nausea or vomiting.    Past Medical History  Diagnosis Date  . Migraine   . Multiple sclerosis     History reviewed. No pertinent past surgical history.  No family history on file.  History  Substance Use Topics  . Smoking status: Current Everyday Smoker  . Smokeless tobacco: Not on file  . Alcohol Use: No    OB History    Grav Para Term Preterm Abortions TAB SAB Ect Mult Living                  Review of Systems  Constitutional: Negative for fever and chills.  Eyes: Positive for photophobia. Negative for visual disturbance.  Gastrointestinal: Negative for nausea and vomiting.  Neurological: Positive for headaches. Negative for  dizziness, speech difficulty, weakness, light-headedness and numbness.  Psychiatric/Behavioral: Negative for confusion.    Allergies  Review of patient's allergies indicates no known allergies.  Home Medications   Current Outpatient Rx  Name Route Sig Dispense Refill  . ASPIRIN-ACETAMINOPHEN-CAFFEINE 250-250-65 MG PO TABS Oral Take 1 tablet by mouth every 6 (six) hours as needed. Pain.    Marland Kitchen BUTALBITAL-APAP-CAFFEINE 50-325-40 MG PO TABS Oral Take 1-2 tablets by mouth every 4 (four) hours as needed. Migraine. Max 6 tabs/day.    . SUMATRIPTAN SUCCINATE 6 MG/0.5ML [place] SOLN Subcutaneous Inject 0.5 mLs (6 mg total) into the skin every 2 (two) hours as needed for migraine or headache. F 2 vial 0    BP 95/61  Pulse 51  Temp(Src) 98.8 F (37.1 C) (Oral)  Resp 16  Ht 5\' 4"  (1.626 m)  Wt 125 lb (56.7 kg)  BMI 21.46 kg/m2  LMP 01/06/2012  Physical Exam  Nursing note and vitals reviewed. Constitutional: She is oriented to person, place, and time. She appears well-developed and well-nourished. No distress.  HENT:  Head: Normocephalic and atraumatic.  Eyes: Conjunctivae and EOM are normal. Pupils are equal, round, and reactive to light.  Cardiovascular: Normal rate and regular rhythm.   Pulmonary/Chest: Effort normal and breath sounds normal.  Abdominal: Soft.  Neurological: She is alert and oriented to person, place, and time. She has normal strength. No cranial nerve deficit or sensory deficit. Gait normal.  Skin: Skin is warm and dry.  Psychiatric: She has a normal mood and  affect. Her behavior is normal.    ED Course  Procedures       1. Headache       MDM  Pt seen and evaluated. Patient no acute distress.  Patient presents with typical migraine headaches. Patient has been seen in emergency room multiple times recently for similar complaints. Patient reports receiving Imitrex injections and returns with good relief of symptoms. Patient reports being at [facility]  yesterday for similar headaches. She did have a CT scan of her head that she reports was normal.    9:45 PM patient reports headache is doing much better. She stills complains of some stiffness and soreness in the neck area. Valum ordered to relax muscles.  Angus Seller, [place] 01/21/12 [phone]

## 2012-01-20 NOTE — Discharge Instructions (Signed)
You were seen and treated for your headache symptoms. You were given medications and reported feeling better. At this time your providers feel you may return home and continue followup with your primary doctor's and specialists. If you develop any worsening symptoms or any new concerning symptoms such as fever, chills, sweats please return to emergency room.    Headache, General, Unknown Cause The specific cause of your headache may not have been found today. There are many causes and types of headache. A few common ones are:  Tension headache.   Migraine.   Infections (examples: dental and sinus infections).   Bone and/or joint problems in the neck or jaw.   Depression.   Eye problems.  These headaches are not life threatening.  Headaches can sometimes be diagnosed by a patient history and a physical exam. Sometimes, lab and imaging studies (such as x-ray and/or CT scan) are used to rule out more serious problems. In some cases, a spinal tap (lumbar puncture) may be requested. There are many times when your exam and tests may be normal on the first visit even when there is a serious problem causing your headaches. Because of that, it is very important to follow up with your doctor or local clinic for further evaluation. FINDING OUT THE RESULTS OF TESTS  If a radiology test was performed, a radiologist will review your results.   You will be contacted by the emergency department or your physician if any test results require a change in your treatment plan.   Not all test results may be available during your visit. If your test results are not back during the visit, make an appointment with your caregiver to find out the results. Do not assume everything is normal if you have not heard from your caregiver or the medical facility. It is important for you to follow up on all of your test results.  HOME CARE INSTRUCTIONS   Keep follow-up appointments with your caregiver, or any specialist  referral.   Only take over-the-counter or prescription medicines for pain, discomfort, or fever as directed by your caregiver.   Biofeedback, massage, or other relaxation techniques may be helpful.   Ice packs or heat applied to the head and neck can be used. Do this three to four times per day, or as needed.   Call your doctor if you have any questions or concerns.   If you smoke, you should quit.  SEEK MEDICAL CARE IF:   You develop problems with medications prescribed.   You do not respond to or obtain relief from medications.   You have a change from the usual headache.   You develop nausea or vomiting.  SEEK IMMEDIATE MEDICAL CARE IF:   If your headache becomes severe.   You have an unexplained oral temperature above 102 F (38.9 C), or as your caregiver suggests.   You have a stiff neck.   You have loss of vision.   You have muscular weakness.   You have loss of muscular control.   You develop severe symptoms different from your first symptoms.   You start losing your balance or have trouble walking.   You feel faint or pass out.  MAKE SURE YOU:   Understand these instructions.   Will watch your condition.   Will get help right away if you are not doing well or get worse.  Document Released: 07/30/2005 Document Revised: 07/19/2011 Document Reviewed: 03/18/2008 [facility] Patient Information 2012 [place], [place].   RESOURCE GUIDE  Chronic Pain Problems: Contact [facility]  [phone] Patients need to be referred by their primary care doctor.  Insufficient Money for Medicine: Contact United Way:  call "211" or [facility] [phone].  No Primary Care Doctor: - Call [facility] Connect  [phone] - can help you locate a primary care doctor that  accepts your insurance, provides certain services, etc. - Physician Referral Service- [phone]  Agencies that provide inexpensive medical care: - [facility]   [phone] - [facility]  [phone] - [facility]  [phone] - Women's Clinic  [phone] - Planned Parenthood  [phone] [facility]  [phone]  Medicaid-accepting [facility] Providers: - [facility]- [address]  [phone], Mon-Fri 9am-7pm, Sat 9am-1pm - [facility]- [address] [place], [address] [place]  [phone] - [facility]- [address], [address] [place]  [phone] [facility]- [address]  [phone] - Renaye Rakers- [address] [place], [address], [phone]  Only accepts [place] patients after they have their name  applied to their card  Self Pay (no insurance) in [place]: - Sickle Cell Patients: Dr Willey Blade, [facility] Internal [facility]  [address] [place], [phone] - [facility]- [address] [place]  [phone]       [facility] [place]- [address]       -     [facility]- see information above (Speak to [employer] if you do not have insurance)       -  [facility]- [address] [place], [phone]       -  [facility]- [address],  [phone]       -  [facility]- [address] [phone]       -  Dr Julio Sicks-  [address], [place], [phone]       -  [facility]- [address] [phone]       -  [facility]- [address], [phone], also [address] [phone]       -    [facility]- [address] [place], [phone], 1st & 3rd Saturday   every month, 10am-1pm  1) Find a Doctor and Pay Out of Pocket Although you won't have to find out who is covered by your insurance plan, it is a good idea to ask around and get recommendations. You will then need to call the office and see if the doctor you have chosen will accept you as a new patient and what types of options  they offer for patients who are self-pay. Some doctors offer discounts or will set up payment plans for their patients who do not have insurance, but you will need to ask so you aren't surprised when you get to your appointment.  2) Contact Your Local Health Department Not all health departments have doctors that can see patients for sick visits, but many do, so it is worth a call to see if yours does. If you don't know where your local health department is, you can check in your phone book. The CDC also has a tool to help you locate your state's health department, and many state websites also have listings of  all of their local health departments.  3) Find a Walk-in Clinic If your illness is not likely to be very severe or complicated, you may want to try a walk in clinic. These are popping up all over the country in pharmacies, drugstores, and shopping centers. They're usually staffed by nurse practitioners or physician assistants that have been trained to treat common illnesses and complaints. They're usually fairly quick and inexpensive. However, if you have serious medical issues or chronic medical problems, these are probably not your best option  STD Testing - [facility] Department of [facility], STD Clinic, [address] [place], phone [phone] or [phone].  Monday - Friday, call for an appointment. [facility] Department of [employer], STD Clinic, [address], [place], phone [phone] or [phone].  Monday - Friday, call for an appointment.  Abuse/Neglect: [facility] Child Abuse Hotline 909-721-5544 [facility] Child Abuse Hotline 239-530-4265 (After Hours)  Emergency Shelter:  [facility] 617-641-5064  Maternity Homes: - Room at the [place] [facility] (901)[phone] - [facility] [phone]  MRSA Hotline #:   (334) 091-3314  [facility] Resources  [facility]  [place]  United Way [facility] Dept. [address]                 [address]         [address] [place] [address]  Blondell Reveal Phone:  [phone]                                  Phone:  [phone]                   Phone:  [phone]  [facility], [phone] - [facility] - [phone]       -     [facility] in [place], [address],                                  [phone], [facility] Child Abuse Hotline 312 563 7151 or 254-051-4716 (After Hours)   Behavioral Health Services  Substance Abuse Resources: - [facility] Drug Services  215-024-6085 - [facility] 303-537-4629 - [facility] [place] (913)[phone] Floydene Flock 4101426904 - Residential & Outpatient Substance Abuse Program  ([phone]  Psychological Services: [facility]  2125233657 Services  [phone] - [facility], [phone] [place]. [address] [place], ACCESS LINE: 9075720225 or [phone], [employer]  Dental Assistance  If unable to pay or uninsured, contact:  [facility] or [facility]. to become qualified for the adult dental clinic.  Patients with Medicaid: [facility] [phone] [address], [phone] [address] [facility], [phone]  If unable to pay, or  uninsured, contact [facility] (630)026-1104) or [facility] Department [phone] in [place], [phone] in [place] [facility]) to become qualified for the adult dental clinic  Other Low-Cost Community Dental Services: - [facility]- [address] [place], [place], [place], [address], [phone], Ext. 123, 2nd and 4th Thursday of the month at 6:30am.  10 clients each day by appointment, can sometimes see  walk-in patients if someone does not show for an appointment. [facility]- [address] [place], [place], [address], [phone] - [facility]- [address] [place], [place], [address], [phone] - [place]- [phone] [facility]- [phone] [facility] Department- [phone]

## 2012-01-21 NOTE — ED Provider Notes (Signed)
Medical screening examination/treatment/procedure(s) were performed by non-physician practitioner and as supervising physician I was immediately available for consultation/collaboration.  Doug Sou, MD 01/21/12 0030

## 2012-01-22 ENCOUNTER — Emergency Department: Payer: Self-pay | Admitting: Emergency Medicine

## 2012-01-22 LAB — BASIC METABOLIC PANEL
Anion Gap: 6 — ABNORMAL LOW (ref 7–16)
BUN: 9 mg/dL (ref 7–18)
Calcium, Total: 9.3 mg/dL (ref 8.5–10.1)
Chloride: 105 mmol/L (ref 98–107)
Co2: 27 mmol/L (ref 21–32)
Osmolality: 275 (ref 275–301)
Potassium: 3.9 mmol/L (ref 3.5–5.1)
Sodium: 138 mmol/L (ref 136–145)

## 2012-01-22 LAB — CBC
HCT: 39.9 % (ref 35.0–47.0)
MCH: 32.9 pg (ref 26.0–34.0)
MCHC: 33.6 g/dL (ref 32.0–36.0)
MCV: 98 fL (ref 80–100)
Platelet: 211 10*3/uL (ref 150–440)
RDW: 12.6 % (ref 11.5–14.5)

## 2012-08-10 ENCOUNTER — Inpatient Hospital Stay (HOSPITAL_COMMUNITY)
Admission: AD | Admit: 2012-08-10 | Discharge: 2012-08-10 | Disposition: A | Discharge status: Home / Self Care | Payer: Medicaid Other | Source: Ambulatory Visit | Attending: Obstetrics and Gynecology | Admitting: Obstetrics and Gynecology

## 2012-08-10 ENCOUNTER — Encounter (HOSPITAL_COMMUNITY): Payer: Self-pay | Admitting: Advanced Practice Midwife

## 2012-08-10 ENCOUNTER — Inpatient Hospital Stay (HOSPITAL_COMMUNITY): Payer: Medicaid Other

## 2012-08-10 DIAGNOSIS — R1031 Right lower quadrant pain: Secondary | ICD-10-CM | POA: Insufficient documentation

## 2012-08-10 DIAGNOSIS — O34599 Maternal care for other abnormalities of gravid uterus, unspecified trimester: Secondary | ICD-10-CM | POA: Insufficient documentation

## 2012-08-10 DIAGNOSIS — N831 Corpus luteum cyst of ovary, unspecified side: Secondary | ICD-10-CM | POA: Insufficient documentation

## 2012-08-10 LAB — CBC
Hemoglobin: 11.6 g/dL — ABNORMAL LOW (ref 12.0–15.0)
MCH: 32.7 pg (ref 26.0–34.0)
MCV: 94.4 fL (ref 78.0–100.0)
RBC: 3.55 MIL/uL — ABNORMAL LOW (ref 3.87–5.11)

## 2012-08-10 LAB — URINALYSIS, ROUTINE W REFLEX MICROSCOPIC
Bilirubin Urine: NEGATIVE
Glucose, UA: NEGATIVE mg/dL
Hgb urine dipstick: NEGATIVE
Ketones, ur: NEGATIVE mg/dL
pH: 5.5 (ref 5.0–8.0)

## 2012-08-10 MED ORDER — ACETAMINOPHEN 500 MG PO TABS
1000.0000 mg | ORAL_TABLET | Freq: Once | ORAL | Status: AC
  Administered 2012-08-10: 1000 mg via ORAL
  Filled 2012-08-10: qty 2

## 2012-08-10 MED ORDER — CONCEPT OB 130-92.4-1 MG PO CAPS: 1.0000 | ORAL_CAPSULE | Freq: Every day | ORAL | Status: DC

## 2012-08-10 NOTE — MAU Provider Note (Signed)
Chief Complaint: Abdominal Pain  First Provider Initiated Contact with Patient 08/10/12 2211     SUBJECTIVE HPI: Michelle Rhodes is a 31 y.o. [id number] at [redacted]w[redacted]d by LMP who presents with intermittent RLQ pain x 2 hours that she rates 6/10. She has not tried anything to treat the pain. Pain is worse with legs extended. Reports nausea x several weeks, no recent change. Denies fever, chills, vomiting, diarrhea, constipation, urinary complaints, vaginal bleeding. Reports occasional vaginal discharge. Has not had any testing this pregnancy. NOB visit scheduled [facility] at [facility].   Past Medical History  Diagnosis Date  . Migraine   . Multiple sclerosis    OB History    Grav Para Term Preterm Abortions TAB SAB Ect Mult Living   5 1 1  2 1 1   1      # Outc Date GA Lbr Len/2nd Wgt Sex Del Anes PTL Lv   1 TRM 1/02    M SVD EPI  Yes   2 SAB 1/10           3 CUR            4 TAB            5 GRA              History reviewed. No pertinent past surgical history. History   Social History  . Marital Status: Single    Spouse Name: N/A    Number of Children: N/A  . Years of Education: N/A   Occupational History  . Not on file.   Social History Main Topics  . Smoking status: Current Every Day Smoker  . Smokeless tobacco: Not on file  . Alcohol Use: No  . Drug Use: No  . Sexually Active: Yes   Other Topics Concern  . Not on file   Social History Narrative  . No narrative on file   No current facility-administered medications on file prior to encounter.   No current outpatient prescriptions on file prior to encounter.   No Known Allergies  ROS: Pertinent items in HPI  OBJECTIVE Blood pressure 108/63, pulse 70, temperature 98.6 F (37 C), temperature source Oral, resp. rate 18, height 5\' 4"  (1.626 m), weight 62.596 kg (138 lb), last menstrual period 06/06/2012, SpO2 100.00%, unknown if currently breastfeeding. GENERAL: Well-developed, well-nourished female in mild  distress.  HEENT: Normocephalic HEART: normal rate RESP: normal effort ABDOMEN: Soft, non-tender, no masses. Pos BS.  EXTREMITIES: Nontender, no edema NEURO: Alert and oriented SPECULUM EXAM: NEFG, physiologic discharge, no blood noted, cervix clean BIMANUAL: cervix closed; uterus slightly enlarged, no adnexal tenderness or masses on left. Positive adnexal tenderness on right. No mass. No CMT.   LAB RESULTS Results for orders placed during the hospital encounter of 08/10/12 (from the past 24 hour(s))  URINALYSIS, ROUTINE W REFLEX MICROSCOPIC     Status: Normal   Collection Time   08/10/12  9:13 PM      Component Value Range   Color, Urine YELLOW  YELLOW   APPearance CLEAR  CLEAR   Specific Gravity, Urine 1.010  1.005 - 1.030   pH 5.5  5.0 - 8.0   Glucose, UA NEGATIVE  NEGATIVE mg/dL   Hgb urine dipstick NEGATIVE  NEGATIVE   Bilirubin Urine NEGATIVE  NEGATIVE   Ketones, ur NEGATIVE  NEGATIVE mg/dL   Protein, ur NEGATIVE  NEGATIVE mg/dL   Urobilinogen, UA 0.2  0.0 - 1.0 mg/dL   Nitrite NEGATIVE  NEGATIVE  Leukocytes, UA NEGATIVE  NEGATIVE  POCT PREGNANCY, URINE     Status: Abnormal   Collection Time   08/10/12  9:24 PM      Component Value Range   Preg Test, Ur POSITIVE (*) NEGATIVE  CBC     Status: Abnormal   Collection Time   08/10/12 10:06 PM      Component Value Range   WBC 7.1  4.0 - 10.5 K/uL   RBC 3.55 (*) 3.87 - 5.11 MIL/uL   Hemoglobin 11.6 (*) 12.0 - 15.0 g/dL   HCT 16.1 (*) 09.6 - 04.5 %   MCV 94.4  78.0 - 100.0 fL   MCH 32.7  26.0 - 34.0 pg   MCHC 34.6  30.0 - 36.0 g/dL   RDW 40.9  81.1 - 91.4 %   Platelets 232  150 - 400 K/uL    IMAGING SIUP measuring 9.1 weeks. ? Small CLC in left. Normal right ovary. FHR 174.   MAU COURSE ES Tylenol given. Pain improved significantly.   ASSESSMENT 1. Corpus luteum cyst   2. RLQ abdominal pain     PLAN Discharge home. Very low suspicion for appendicitis, but precautions reviewed.      Follow-up Information     Follow up with CALLAHAN, SIDNEY, DO. On 08/18/2012.   Contact information:   [address] [place] [address] [phone]       Follow up with THE [facility] [place] MATERNITY ADMISSIONS. (As needed if symptoms worsen)    Contact information:   [address] [id number] mc [place] [address] [phone]          Medication List     As of 08/10/2012 11:48 PM    TAKE these medications         acetaminophen 500 MG tablet   Commonly known as: TYLENOL   Take 1,000 mg by mouth every 6 (six) hours as needed. pain      CONCEPT OB 130-92.4-1 MG Caps   Take 1 tablet by mouth daily.        [place], CNM 08/10/2012  11:48 PM

## 2012-08-10 NOTE — MAU Note (Signed)
Pt reports pain in lower stomach mostly on rt side x 2 hours, denies bleeding. LMP 06/06/2012, positive preg test

## 2012-08-11 ENCOUNTER — Encounter (HOSPITAL_COMMUNITY): Payer: Self-pay | Admitting: Advanced Practice Midwife

## 2012-08-18 ENCOUNTER — Other Ambulatory Visit: Payer: Self-pay | Admitting: Obstetrics and Gynecology

## 2013-01-15 ENCOUNTER — Emergency Department (HOSPITAL_COMMUNITY)
Admission: EM | Admit: 2013-01-15 | Discharge: 2013-01-15 | Disposition: A | Discharge status: Home / Self Care | Payer: Medicaid Other | Source: Home / Self Care | Attending: Family Medicine | Admitting: Family Medicine

## 2013-01-15 ENCOUNTER — Emergency Department (INDEPENDENT_AMBULATORY_CARE_PROVIDER_SITE_OTHER): Payer: Medicaid Other

## 2013-01-15 ENCOUNTER — Encounter (HOSPITAL_COMMUNITY): Payer: Self-pay | Admitting: Emergency Medicine

## 2013-01-15 DIAGNOSIS — M65849 Other synovitis and tenosynovitis, unspecified hand: Secondary | ICD-10-CM

## 2013-01-15 DIAGNOSIS — M65839 Other synovitis and tenosynovitis, unspecified forearm: Secondary | ICD-10-CM

## 2013-01-15 DIAGNOSIS — M778 Other enthesopathies, not elsewhere classified: Secondary | ICD-10-CM

## 2013-01-15 MED ORDER — IBUPROFEN 600 MG PO TABS: 600.0000 mg | ORAL_TABLET | Freq: Three times a day (TID) | ORAL | Status: DC | PRN

## 2013-01-15 MED ORDER — LIDOCAINE 5 % EX OINT: TOPICAL_OINTMENT | CUTANEOUS | Status: DC | PRN

## 2013-01-15 MED ORDER — TRAMADOL HCL 50 MG PO TABS: 50.0000 mg | ORAL_TABLET | Freq: Four times a day (QID) | ORAL | Status: DC | PRN

## 2013-01-15 NOTE — ED Notes (Signed)
Pt c/o right thumb inj onset last night while play wrestling w/her 31 year old son Reports she fell onto carpet flooring. Pain increases w/certain movements and radiates up towards her forearm Reports she went to work today and it bothered her while typing  She is alert and oriented w/no signs of acute distress.

## 2013-01-17 NOTE — ED Provider Notes (Signed)
History     CSN: [id number]  Arrival date & time 01/15/13  1721   First MD Initiated Contact with Patient 01/15/13 1832      Chief Complaint  Patient presents with  . Finger Injury    (Consider location/radiation/quality/duration/timing/severity/associated sxs/prior treatment) HPI Comments: 32 y/o right handed female here c/o pain at base of right thumb since last night. Patient states she was playing wrestling with her 74 y/o son and fell backwards landing over her overstretched right thumb. Pain worse with thumb movements and radiates towards forearm. No bruising. No numbness or paresthesias.    Past Medical History  Diagnosis Date  . Migraine   . Multiple sclerosis     History reviewed. No pertinent past surgical history.  Family History  Problem Relation Age of Onset  . Hypertension Mother   . Stroke Mother   . Cancer Mother   . Hypertension Father   . Stroke Father   . Cancer Father     History  Substance Use Topics  . Smoking status: Current Every Day Smoker  . Smokeless tobacco: Not on file  . Alcohol Use: No    OB History   Grav Para Term Preterm Abortions TAB SAB Ect Mult Living   7 5 1  1  0 1   1      Review of Systems  HENT:       Denies head or neck  injury  Musculoskeletal:       Right upper extremity injury As per HPI  Skin: Negative for wound.  All other systems reviewed and are negative.    Allergies  Review of patient's allergies indicates no known allergies.  Home Medications   Current Outpatient Rx  Name  Route  Sig  Dispense  Refill  . acetaminophen (TYLENOL) 500 MG tablet   Oral   Take 1,000 mg by mouth every 6 (six) hours as needed. pain         . ibuprofen (ADVIL,MOTRIN) 600 MG tablet   Oral   Take 1 tablet (600 mg total) by mouth every 8 (eight) hours as needed for pain.   21 tablet   0   . lidocaine (XYLOCAINE) 5 % ointment   Topical   Apply topically as needed.   35.44 g   0   . Prenat w/o A Vit-FeFum-FePo-FA  (CONCEPT OB) 130-92.4-1 MG CAPS   Oral   Take 1 tablet by mouth daily.   30 capsule   12   . traMADol (ULTRAM) 50 MG tablet   Oral   Take 1 tablet (50 mg total) by mouth every 6 (six) hours as needed for pain.   15 tablet   0     BP 104/68  Temp(Src) 97.4 F (36.3 C) (Oral)  Resp 16  SpO2 100%  LMP 01/07/2013  Breastfeeding? No  Physical Exam  Nursing note and vitals reviewed. Constitutional: She is oriented to person, place, and time. She appears well-developed and well-nourished. No distress.  HENT:  Head: Normocephalic and atraumatic.  Neck: Neck supple.  Cardiovascular: Normal heart sounds.   Pulmonary/Chest: Breath sounds normal.  Musculoskeletal:  Right hand: nor swelling erythema or obvious deformity. There are normal radio ulnar pulses. There is normal superficial sensation including 2 point discrimination and proprioception of the entire right upper extremity. There is tenderness with palpation over thumb extensor area in dorsum of hand.pain worse with thumb movement. Patient able to perform thumb opposition to all other digits as well as flexion and  extension of IPJ despite reported pain. There is reported focal tenderness with palpation over proximal radial bone area about 5 cm below elbow. No bruising swelling induration or crepitus.   Neurological: She is alert and oriented to person, place, and time.  Skin: She is not diaphoretic. No erythema.  No bruising.    ED Course  Procedures (including critical care time)  Labs Reviewed - No data to display Dg Forearm Right  01/15/2013   *RADIOLOGY REPORT*  Clinical Data: Superior forearm pain.  RIGHT FOREARM - 2 VIEW  Comparison: None.  Findings: Left radius and ulna appear within normal limits.  No fracture.  No radiopaque foreign body.  Soft tissues appear normal.  IMPRESSION: Negative.   Original Report Authenticated By: Andreas Newport, M.D.   Dg Hand Complete Right  01/15/2013   *RADIOLOGY REPORT*  Clinical Data:  Right hand pain following a fall.  RIGHT HAND - COMPLETE 3+ VIEW  Comparison: None.  Findings: Normal appearing bones and soft tissues without fracture or dislocation.  IMPRESSION: Normal examination.   Original Report Authenticated By: Beckie Salts, M.D.     1. Tendinitis of thumb       MDM  Treated with ibuprofen, tramadol, and lidocaine ointment.  Placed on wrist splint with thumb spica. Supportive care including rehabilitation exercises and red flags that should prompt her return to medical attention discussed with patient and provided in writing.         Sharin Grave, MD 01/19/13 [phone]

## 2014-03-27 ENCOUNTER — Emergency Department (HOSPITAL_COMMUNITY)
Admission: EM | Admit: 2014-03-27 | Discharge: 2014-03-27 | Disposition: A | Discharge status: Home / Self Care | Payer: Medicaid Other | Attending: Emergency Medicine | Admitting: Emergency Medicine

## 2014-03-27 ENCOUNTER — Encounter (HOSPITAL_COMMUNITY): Payer: Self-pay | Admitting: Emergency Medicine

## 2014-03-27 DIAGNOSIS — F17200 Nicotine dependence, unspecified, uncomplicated: Secondary | ICD-10-CM | POA: Insufficient documentation

## 2014-03-27 DIAGNOSIS — G43001 Migraine without aura, not intractable, with status migrainosus: Secondary | ICD-10-CM | POA: Insufficient documentation

## 2014-03-27 DIAGNOSIS — G43909 Migraine, unspecified, not intractable, without status migrainosus: Secondary | ICD-10-CM | POA: Insufficient documentation

## 2014-03-27 MED ORDER — METOCLOPRAMIDE HCL 5 MG/ML IJ SOLN
10.0000 mg | Freq: Once | INTRAMUSCULAR | Status: AC
  Administered 2014-03-27: 10 mg via INTRAVENOUS
  Filled 2014-03-27: qty 2

## 2014-03-27 MED ORDER — METHOCARBAMOL 500 MG PO TABS
1000.0000 mg | ORAL_TABLET | Freq: Once | ORAL | Status: AC
  Administered 2014-03-27: 1000 mg via ORAL
  Filled 2014-03-27: qty 2

## 2014-03-27 MED ORDER — KETOROLAC TROMETHAMINE 30 MG/ML IJ SOLN
30.0000 mg | Freq: Once | INTRAMUSCULAR | Status: AC
  Administered 2014-03-27: 30 mg via INTRAVENOUS
  Filled 2014-03-27: qty 1

## 2014-03-27 MED ORDER — DIPHENHYDRAMINE HCL 50 MG/ML IJ SOLN
25.0000 mg | Freq: Once | INTRAMUSCULAR | Status: AC
  Administered 2014-03-27: 25 mg via INTRAVENOUS
  Filled 2014-03-27: qty 1

## 2014-03-27 MED ORDER — METHOCARBAMOL 750 MG PO TABS: 750.0000 mg | ORAL_TABLET | Freq: Four times a day (QID) | ORAL | Status: DC | PRN

## 2014-03-27 NOTE — Discharge Instructions (Signed)
Please follow up with your neurologist for re-evaluation of your ongoing migraines and possible new medications.  Return to the ER for worsening condition or new concerning symptoms.   Migraine Headache A migraine headache is an intense, throbbing pain on one or both sides of your head. A migraine can last for 30 minutes to several hours. CAUSES  The exact cause of a migraine headache is not always known. However, a migraine may be caused when nerves in the brain become irritated and release chemicals that cause inflammation. This causes pain. Certain things may also trigger migraines, such as:  Alcohol.  Smoking.  Stress.  Menstruation.  Aged cheeses.  Foods or drinks that contain nitrates, glutamate, aspartame, or tyramine.  Lack of sleep.  Chocolate.  Caffeine.  Hunger.  Physical exertion.  Fatigue.  Medicines used to treat chest pain (nitroglycerine), birth control pills, estrogen, and some blood pressure medicines. SIGNS AND SYMPTOMS  Pain on one or both sides of your head.  Pulsating or throbbing pain.  Severe pain that prevents daily activities.  Pain that is aggravated by any physical activity.  Nausea, vomiting, or both.  Dizziness.  Pain with exposure to bright lights, loud noises, or activity.  General sensitivity to bright lights, loud noises, or smells. Before you get a migraine, you may get warning signs that a migraine is coming (aura). An aura may include:  Seeing flashing lights.  Seeing bright spots, halos, or zigzag lines.  Having tunnel vision or blurred vision.  Having feelings of numbness or tingling.  Having trouble talking.  Having muscle weakness. DIAGNOSIS  A migraine headache is often diagnosed based on:  Symptoms.  Physical exam.  A CT scan or MRI of your head. These imaging tests cannot diagnose migraines, but they can help rule out other causes of headaches. TREATMENT Medicines may be given for pain and nausea.  Medicines can also be given to help prevent recurrent migraines.  HOME CARE INSTRUCTIONS  Only take over-the-counter or prescription medicines for pain or discomfort as directed by your health care provider. The use of long-term narcotics is not recommended.  Lie down in a dark, quiet room when you have a migraine.  Keep a journal to find out what may trigger your migraine headaches. For example, write down:  What you eat and drink.  How much sleep you get.  Any change to your diet or medicines.  Limit alcohol consumption.  Quit smoking if you smoke.  Get 7-9 hours of sleep, or as recommended by your health care provider.  Limit stress.  Keep lights dim if bright lights bother you and make your migraines worse. SEEK IMMEDIATE MEDICAL CARE IF:   Your migraine becomes severe.  You have a fever.  You have a stiff neck.  You have vision loss.  You have muscular weakness or loss of muscle control.  You start losing your balance or have trouble walking.  You feel faint or pass out.  You have severe symptoms that are different from your first symptoms. MAKE SURE YOU:   Understand these instructions.  Will watch your condition.  Will get help right away if you are not doing well or get worse. Document Released: 07/30/2005 Document Revised: 12/14/2013 Document Reviewed: 04/06/2013 [facility] Patient Information 2015 [place]. This information is not intended to replace advice given to you by your health care provider. Make sure you discuss any questions you have with your health care provider.  Headaches, Frequently Asked Questions MIGRAINE HEADACHES Q: What is  migraine? What causes it? How can I treat it? A: Generally, migraine headaches begin as a dull ache. Then they develop into a constant, throbbing, and pulsating pain. You may experience pain at the temples. You may experience pain at the front or back of one or both sides of the head. The pain is usually  accompanied by a combination of:  Nausea.  Vomiting.  Sensitivity to light and noise. Some people (about 15%) experience an aura (see below) before an attack. The cause of migraine is believed to be chemical reactions in the brain. Treatment for migraine may include over-the-counter or prescription medications. It may also include self-help techniques. These include relaxation training and biofeedback.  Q: What is an aura? A: About 15% of people with migraine get an "aura". This is a sign of neurological symptoms that occur before a migraine headache. You may see wavy or jagged lines, dots, or flashing lights. You might experience tunnel vision or blind spots in one or both eyes. The aura can include visual or auditory hallucinations (something imagined). It may include disruptions in smell (such as strange odors), taste or touch. Other symptoms include:  Numbness.  A "pins and needles" sensation.  Difficulty in recalling or speaking the correct word. These neurological events may last as long as 60 minutes. These symptoms will fade as the headache begins. Q: What is a trigger? A: Certain physical or environmental factors can lead to or "trigger" a migraine. These include:  Foods.  Hormonal changes.  Weather.  Stress. It is important to remember that triggers are different for everyone. To help prevent migraine attacks, you need to figure out which triggers affect you. Keep a headache diary. This is a good way to track triggers. The diary will help you talk to your healthcare professional about your condition. Q: Does weather affect migraines? A: Bright sunshine, hot, humid conditions, and drastic changes in barometric pressure may lead to, or "trigger," a migraine attack in some people. But studies have shown that weather does not act as a trigger for everyone with migraines. Q: What is the link between migraine and hormones? A: Hormones start and regulate many of your body's  functions. Hormones keep your body in balance within a constantly changing environment. The levels of hormones in your body are unbalanced at times. Examples are during menstruation, pregnancy, or menopause. That can lead to a migraine attack. In fact, about three quarters of all women with migraine report that their attacks are related to the menstrual cycle.  Q: Is there an increased risk of stroke for migraine sufferers? A: The likelihood of a migraine attack causing a stroke is very remote. That is not to say that migraine sufferers cannot have a stroke associated with their migraines. In persons under age 35, the most common associated factor for stroke is migraine headache. But over the course of a person's normal life span, the occurrence of migraine headache may actually be associated with a reduced risk of dying from cerebrovascular disease due to stroke.  Q: What are acute medications for migraine? A: Acute medications are used to treat the pain of the headache after it has started. Examples over-the-counter medications, NSAIDs, ergots, and triptans.  Q: What are the triptans? A: Triptans are the newest class of abortive medications. They are specifically targeted to treat migraine. Triptans are vasoconstrictors. They moderate some chemical reactions in the brain. The triptans work on receptors in your brain. Triptans help to restore the balance of a neurotransmitter called  serotonin. Fluctuations in levels of serotonin are thought to be a main cause of migraine.  Q: Are over-the-counter medications for migraine effective? A: Over-the-counter, or "OTC," medications may be effective in relieving mild to moderate pain and associated symptoms of migraine. But you should see your caregiver before beginning any treatment regimen for migraine.  Q: What are preventive medications for migraine? A: Preventive medications for migraine are sometimes referred to as "prophylactic" treatments. They are used to  reduce the frequency, severity, and length of migraine attacks. Examples of preventive medications include antiepileptic medications, antidepressants, beta-blockers, calcium channel blockers, and NSAIDs (nonsteroidal anti-inflammatory drugs). Q: Why are anticonvulsants used to treat migraine? A: During the past few years, there has been an increased interest in antiepileptic drugs for the prevention of migraine. They are sometimes referred to as "anticonvulsants". Both epilepsy and migraine may be caused by similar reactions in the brain.  Q: Why are antidepressants used to treat migraine? A: Antidepressants are typically used to treat people with depression. They may reduce migraine frequency by regulating chemical levels, such as serotonin, in the brain.  Q: What alternative therapies are used to treat migraine? A: The term "alternative therapies" is often used to describe treatments considered outside the scope of conventional Western medicine. Examples of alternative therapy include acupuncture, acupressure, and yoga. Another common alternative treatment is herbal therapy. Some herbs are believed to relieve headache pain. Always discuss alternative therapies with your caregiver before proceeding. Some herbal products contain arsenic and other toxins. TENSION HEADACHES Q: What is a tension-type headache? What causes it? How can I treat it? A: Tension-type headaches occur randomly. They are often the result of temporary stress, anxiety, fatigue, or anger. Symptoms include soreness in your temples, a tightening band-like sensation around your head (a "vice-like" ache). Symptoms can also include a pulling feeling, pressure sensations, and contracting head and neck muscles. The headache begins in your forehead, temples, or the back of your head and neck. Treatment for tension-type headache may include over-the-counter or prescription medications. Treatment may also include self-help techniques such as  relaxation training and biofeedback. CLUSTER HEADACHES Q: What is a cluster headache? What causes it? How can I treat it? A: Cluster headache gets its name because the attacks come in groups. The pain arrives with little, if any, warning. It is usually on one side of the head. A tearing or bloodshot eye and a runny nose on the same side of the headache may also accompany the pain. Cluster headaches are believed to be caused by chemical reactions in the brain. They have been described as the most severe and intense of any headache type. Treatment for cluster headache includes prescription medication and oxygen. SINUS HEADACHES Q: What is a sinus headache? What causes it? How can I treat it? A: When a cavity in the bones of the face and skull (a sinus) becomes inflamed, the inflammation will cause localized pain. This condition is usually the result of an allergic reaction, a tumor, or an infection. If your headache is caused by a sinus blockage, such as an infection, you will probably have a fever. An x-ray will confirm a sinus blockage. Your caregiver's treatment might include antibiotics for the infection, as well as antihistamines or decongestants.  REBOUND HEADACHES Q: What is a rebound headache? What causes it? How can I treat it? A: A pattern of taking acute headache medications too often can lead to a condition known as "rebound headache." A pattern of taking too much headache medication  includes taking it more than 2 days per week or in excessive amounts. That means more than the label or a caregiver advises. With rebound headaches, your medications not only stop relieving pain, they actually begin to cause headaches. Doctors treat rebound headache by tapering the medication that is being overused. Sometimes your caregiver will gradually substitute a different type of treatment or medication. Stopping may be a challenge. Regularly overusing a medication increases the potential for serious side  effects. Consult a caregiver if you regularly use headache medications more than 2 days per week or more than the label advises. ADDITIONAL QUESTIONS AND ANSWERS Q: What is biofeedback? A: Biofeedback is a self-help treatment. Biofeedback uses special equipment to monitor your body's involuntary physical responses. Biofeedback monitors:  Breathing.  Pulse.  Heart rate.  Temperature.  Muscle tension.  Brain activity. Biofeedback helps you refine and perfect your relaxation exercises. You learn to control the physical responses that are related to stress. Once the technique has been mastered, you do not need the equipment any more. Q: Are headaches hereditary? A: Four out of five (80%) of people that suffer report a family history of migraine. Scientists are not sure if this is genetic or a family predisposition. Despite the uncertainty, a child has a 50% chance of having migraine if one parent suffers. The child has a 75% chance if both parents suffer.  Q: Can children get headaches? A: By the time they reach high school, most young people have experienced some type of headache. Many safe and effective approaches or medications can prevent a headache from occurring or stop it after it has begun.  Q: What type of doctor should I see to diagnose and treat my headache? A: Start with your primary caregiver. Discuss his or her experience and approach to headaches. Discuss methods of classification, diagnosis, and treatment. Your caregiver may decide to recommend you to a headache specialist, depending upon your symptoms or other physical conditions. Having diabetes, allergies, etc., may require a more comprehensive and inclusive approach to your headache. The National Headache Foundation will provide, upon request, a list of [facility] physician members in your state. Document Released: 10/20/2003 Document Revised: 10/22/2011 Document Reviewed: 03/29/2008 [facility] Patient Information 2015 [place], [place].  This information is not intended to replace advice given to you by your health care provider. Make sure you discuss any questions you have with your health care provider.

## 2014-03-27 NOTE — ED Provider Notes (Signed)
CSN: [id number]     Arrival date & time 03/27/14  0043 History   First MD Initiated Contact with Patient 03/27/14 0229     Chief Complaint  Patient presents with  . Migraine     (Consider location/radiation/quality/duration/timing/severity/associated sxs/prior Treatment) HPI -year-old female presents to emergency home with complaint of headache.  Patient reports history of migraines.  She reports over the last month she has been having 3-4 headaches a week.  She reports that Imitrex is not helping with her symptoms.  [facility]  Patient reports about once a year she has migraine headaches that are resistant to Imitrex and she has come to the emergency department.  She denies any fever chills nausea vomiting diarrhea.  She has photo and phonophobia.  She reports the headache is mainly in the back of her head and along her left neck.  She reports that the headache is typical to other headaches. Past Medical History  Diagnosis Date  . Migraine   . Multiple sclerosis    History reviewed. No pertinent past surgical history. Family History  Problem Relation Age of Onset  . Hypertension Mother   . Stroke Mother   . Cancer Mother   . Hypertension Father   . Stroke Father   . Cancer Father    History  Substance Use Topics  . Smoking status: Current Every Day Smoker  . Smokeless tobacco: Not on file  . Alcohol Use: No   OB History   Grav Para Term Preterm Abortions TAB SAB Ect Mult Living   7 5 1  1  0 1   1     Review of Systems   See History of Present Illness; otherwise all other systems are reviewed and negative  Allergies  Review of patient's allergies indicates no known allergies.  Home Medications   Prior to Admission medications   Medication Sig Start Date End Date Taking? Authorizing Provider  acetaminophen (TYLENOL) 500 MG tablet Take 1,000 mg by mouth every 6 (six) hours as needed for headache. pain   Yes Historical  Provider, MD  methocarbamol (ROBAXIN-750) 750 MG tablet Take 1 tablet (750 mg total) by mouth every 6 (six) hours as needed for muscle spasms (muscle pain). 03/27/14   Olivia Mackielga M Kendrik Mcshan, MD   BP 113/73  Pulse 58  Temp(Src) 98.2 F (36.8 C) (Oral)  Resp 16  SpO2 100%  LMP 03/07/2014 Physical Exam  Nursing note and vitals reviewed. Constitutional: She is oriented to person, place, and time. She appears well-developed and well-nourished. She appears distressed (uncomfortable appearing).  HENT:  Head: Normocephalic and atraumatic.  Right Ear: External ear normal.  Left Ear: External ear normal.  Nose: Nose normal.  Mouth/Throat: Oropharynx is clear and moist.  Eyes: Conjunctivae and EOM are normal. Pupils are equal, round, and reactive to light.  Neck: Normal range of motion. Neck supple. No JVD present. No tracheal deviation present. No thyromegaly present.  Patient is tenderness to palpation along left paraspinal muscles, occiput, and left spinal accessory/trapezius muscles  Cardiovascular: Normal rate, regular rhythm, normal heart sounds and intact distal pulses.  Exam reveals no gallop and no friction rub.   No murmur heard. Pulmonary/Chest: Effort normal and breath sounds normal. No stridor. No respiratory distress. She has no wheezes. She has no rales. She exhibits no tenderness.  Abdominal: Soft. Bowel sounds are normal. She exhibits no distension and no mass. There is no tenderness. There is no rebound and no  guarding.  Musculoskeletal: Normal range of motion. She exhibits no edema and no tenderness.  Lymphadenopathy:    She has no cervical adenopathy.  Neurological: She is alert and oriented to person, place, and time. She has normal reflexes. No cranial nerve deficit. She exhibits normal muscle tone. Coordination normal.  Skin: Skin is warm and dry. No rash noted. No erythema. No pallor.  Psychiatric: She has a normal mood and affect. Her behavior is normal. Judgment and thought  content normal.    ED Course  Procedures (including critical care time) Labs Review Labs Reviewed - No data to display  Imaging Review No results found.   EKG Interpretation None      MDM   Final diagnoses:  Migraine without aura and with status migrainosus, not intractable    33 year old female with migraine headache.  Patient reports she feels much better after the migraine cocktail.  She may have a element of muscle spasm and strain given the location of her pain along her left cervical paraspinal muscles.  Will refer to neurology for reevaluation.    Olivia Mackie, MD 03/27/14 [id number]

## 2014-03-27 NOTE — ED Notes (Signed)
Pt presents with a migraine headache, nausea, and sensitivity to light. Pt reports she has been getting 3-4 a week for several weeks. Pt is unsure when this said migraine started. Pt not answering triage questions very well

## 2014-03-27 NOTE — ED Notes (Signed)
Pt feels she is ready to go home.  Will inform MD.

## 2014-06-14 ENCOUNTER — Encounter (HOSPITAL_COMMUNITY): Payer: Self-pay | Admitting: Emergency Medicine

## 2014-12-28 ENCOUNTER — Encounter
Admission: RE | Admit: 2014-12-28 | Discharge: 2014-12-28 | Disposition: A | Discharge status: Home / Self Care | Payer: Medicaid Other | Source: Ambulatory Visit | Attending: Obstetrics and Gynecology | Admitting: Obstetrics and Gynecology

## 2014-12-28 DIAGNOSIS — N921 Excessive and frequent menstruation with irregular cycle: Secondary | ICD-10-CM | POA: Insufficient documentation

## 2014-12-28 DIAGNOSIS — N939 Abnormal uterine and vaginal bleeding, unspecified: Secondary | ICD-10-CM | POA: Insufficient documentation

## 2014-12-28 DIAGNOSIS — Z01812 Encounter for preprocedural laboratory examination: Secondary | ICD-10-CM | POA: Insufficient documentation

## 2014-12-28 HISTORY — DX: Anxiety disorder, unspecified: F41.9

## 2014-12-28 HISTORY — DX: Unspecified asthma, uncomplicated: J45.909

## 2014-12-28 LAB — CBC
HEMATOCRIT: 39.6 % (ref 35.0–47.0)
HEMOGLOBIN: 13.2 g/dL (ref 12.0–16.0)
MCH: 32.4 pg (ref 26.0–34.0)
MCHC: 33.4 g/dL (ref 32.0–36.0)
MCV: 96.9 fL (ref 80.0–100.0)
Platelets: 244 10*3/uL (ref 150–440)
RBC: 4.08 MIL/uL (ref 3.80–5.20)
RDW: 12.7 % (ref 11.5–14.5)
WBC: 4.7 10*3/uL (ref 3.6–11.0)

## 2014-12-28 NOTE — Patient Instructions (Signed)
  Your procedure is scheduled on: Jan 06, 2015 (Thursday) Report to Same  Day Surgery. [facility] entrance) To find out your arrival time please call [phone] between 1PM - 3PM on  Jan 05, 2015 (Wednesday)  Remember: Instructions that are not followed completely may result in serious medical risk, up to and including death, or upon the discretion of your surgeon and anesthesiologist your surgery may need to be rescheduled.    __x__ 1. Do not eat food or drink liquids after midnight. No gum chewing or hard candies.     ____ 2. No Alcohol for 24 hours before or after surgery.   ____ 3. Bring all medications with you on the day of surgery if instructed.    __x__ 4. Notify your doctor if there is any change in your medical condition     (cold, fever, infections).     Do not wear jewelry, make-up, hairpins, clips or nail polish.  Do not wear lotions, powders, or perfumes. You may wear deodorant.  Do not shave 48 hours prior to surgery. Men may shave face and neck.  Do not bring valuables to the hospital.    [facility] is not responsible for any belongings or valuables.               Contacts, dentures or bridgework may not be worn into surgery.  Leave your suitcase in the car. After surgery it may be brought to your room.  For patients admitted to the hospital, discharge time is determined by your                treatment team.   Patients discharged the day of surgery will not be allowed to drive home.   Please read over the following fact sheets that you were given:   Surgical Site Infection Prevention   ____ Take these medicines the morning of surgery with A SIP OF WATER:    1. Zoloft  2.   3.   4.  5.  6.  ____ Fleet Enema (as directed)   __x__ Use CHG Soap as directed  ____ Use inhalers on the day of surgery  ____ Stop metformin 2 days prior to surgery    ____ Take 1/2 of usual insulin dose the night before surgery and none on the morning of surgery.   ____  Stop Coumadin/Plavix/aspirin on  ____ Stop Anti-inflammatories on  ____ Stop supplements until after surgery.    ____ Bring C-Pap to the hospital.

## 2014-12-30 MED ORDER — BUPIVACAINE HCL (PF) 0.5 % IJ SOLN
INTRAMUSCULAR | Status: AC
  Filled 2014-12-30: qty 30

## 2015-01-06 ENCOUNTER — Inpatient Hospital Stay: Payer: Medicaid Other | Admitting: Anesthesiology

## 2015-01-06 ENCOUNTER — Ambulatory Visit
Admission: RE | Admit: 2015-01-06 | Discharge: 2015-01-06 | DRG: 743 | Disposition: A | Discharge status: Home / Self Care | Payer: Medicaid Other | Source: Ambulatory Visit | Attending: Obstetrics and Gynecology | Admitting: Obstetrics and Gynecology

## 2015-01-06 ENCOUNTER — Encounter: Payer: Self-pay | Admitting: *Deleted

## 2015-01-06 ENCOUNTER — Encounter
Admission: RE | Disposition: A | Discharge status: Home / Self Care | Payer: Self-pay | Source: Ambulatory Visit | Attending: Obstetrics and Gynecology

## 2015-01-06 DIAGNOSIS — N939 Abnormal uterine and vaginal bleeding, unspecified: Secondary | ICD-10-CM | POA: Diagnosis present

## 2015-01-06 DIAGNOSIS — Z302 Encounter for sterilization: Secondary | ICD-10-CM

## 2015-01-06 HISTORY — PX: LAPAROSCOPIC BILATERAL SALPINGO OOPHERECTOMY: SHX5890

## 2015-01-06 HISTORY — PX: ENDOMETRIAL ABLATION: SHX621

## 2015-01-06 LAB — POCT PREGNANCY, URINE: PREG TEST UR: NEGATIVE

## 2015-01-06 SURGERY — SALPINGO-OOPHORECTOMY, BILATERAL, LAPAROSCOPIC
Anesthesia: General | Wound class: Clean Contaminated

## 2015-01-06 MED ORDER — LIDOCAINE HCL (PF) 1 % IJ SOLN
INTRAMUSCULAR | Status: DC | PRN
  Administered 2015-01-06: 20 mL

## 2015-01-06 MED ORDER — ONDANSETRON HCL 4 MG/2ML IJ SOLN
INTRAMUSCULAR | Status: DC | PRN
  Administered 2015-01-06: 4 mg via INTRAVENOUS

## 2015-01-06 MED ORDER — BUPIVACAINE HCL (PF) 0.5 % IJ SOLN
INTRAMUSCULAR | Status: AC
  Filled 2015-01-06: qty 30

## 2015-01-06 MED ORDER — FENTANYL CITRATE (PF) 100 MCG/2ML IJ SOLN
25.0000 ug | INTRAMUSCULAR | Status: DC | PRN
  Administered 2015-01-06 (×4): 25 ug via INTRAVENOUS

## 2015-01-06 MED ORDER — DOCUSATE SODIUM 100 MG PO CAPS: 100.0000 mg | ORAL_CAPSULE | Freq: Two times a day (BID) | ORAL | Status: DC

## 2015-01-06 MED ORDER — KETOROLAC TROMETHAMINE 30 MG/ML IJ SOLN: 15.0000 mg | Freq: Once | INTRAMUSCULAR | Status: DC

## 2015-01-06 MED ORDER — LACTATED RINGERS IV SOLN
INTRAVENOUS | Status: DC
  Administered 2015-01-06 (×2): via INTRAVENOUS

## 2015-01-06 MED ORDER — OXYCODONE-ACETAMINOPHEN 5-325 MG PO TABS
1.0000 | ORAL_TABLET | ORAL | Status: DC | PRN
  Administered 2015-01-06: 1 via ORAL

## 2015-01-06 MED ORDER — FENTANYL CITRATE (PF) 100 MCG/2ML IJ SOLN
INTRAMUSCULAR | Status: AC
  Administered 2015-01-06: 25 ug via INTRAVENOUS
  Filled 2015-01-06: qty 2

## 2015-01-06 MED ORDER — OXYCODONE-ACETAMINOPHEN 5-325 MG PO TABS: 1.0000 | ORAL_TABLET | Freq: Four times a day (QID) | ORAL | Status: DC | PRN

## 2015-01-06 MED ORDER — KETOROLAC TROMETHAMINE 30 MG/ML IJ SOLN
INTRAMUSCULAR | Status: DC | PRN
  Administered 2015-01-06: 30 mg via INTRAVENOUS

## 2015-01-06 MED ORDER — IBUPROFEN 200 MG PO TABS: 200.0000 mg | ORAL_TABLET | ORAL | Status: DC

## 2015-01-06 MED ORDER — ONDANSETRON HCL 4 MG/2ML IJ SOLN: 4.0000 mg | Freq: Once | INTRAMUSCULAR | Status: DC | PRN

## 2015-01-06 MED ORDER — FAMOTIDINE 20 MG PO TABS
20.0000 mg | ORAL_TABLET | Freq: Once | ORAL | Status: AC
  Administered 2015-01-06: 20 mg via ORAL

## 2015-01-06 MED ORDER — ROCURONIUM BROMIDE 100 MG/10ML IV SOLN
INTRAVENOUS | Status: DC | PRN
  Administered 2015-01-06: 40 mg via INTRAVENOUS

## 2015-01-06 MED ORDER — FENTANYL CITRATE (PF) 100 MCG/2ML IJ SOLN
INTRAMUSCULAR | Status: DC | PRN
  Administered 2015-01-06: 100 ug via INTRAVENOUS
  Administered 2015-01-06: 50 ug via INTRAVENOUS

## 2015-01-06 MED ORDER — OXYCODONE-ACETAMINOPHEN 5-325 MG PO TABS
ORAL_TABLET | ORAL | Status: AC
  Filled 2015-01-06: qty 1

## 2015-01-06 MED ORDER — SODIUM CHLORIDE 0.9 % IV SOLN
8.0000 mg | Freq: Four times a day (QID) | INTRAVENOUS | Status: DC | PRN
  Filled 2015-01-06: qty 4

## 2015-01-06 MED ORDER — ONDANSETRON HCL 4 MG PO TABS
4.0000 mg | ORAL_TABLET | Freq: Four times a day (QID) | ORAL | Status: DC | PRN
  Filled 2015-01-06: qty 1

## 2015-01-06 MED ORDER — DEXAMETHASONE SODIUM PHOSPHATE 4 MG/ML IJ SOLN
INTRAMUSCULAR | Status: DC | PRN
  Administered 2015-01-06: 5 mg via INTRAVENOUS

## 2015-01-06 MED ORDER — LACTATED RINGERS IV SOLN: INTRAVENOUS | Status: DC

## 2015-01-06 MED ORDER — FAMOTIDINE 20 MG PO TABS
ORAL_TABLET | ORAL | Status: AC
  Administered 2015-01-06: 20 mg via ORAL
  Filled 2015-01-06: qty 1

## 2015-01-06 MED ORDER — BUPIVACAINE HCL 0.5 % IJ SOLN
INTRAMUSCULAR | Status: DC | PRN
  Administered 2015-01-06: 21 mL

## 2015-01-06 MED ORDER — SILVER NITRATE-POT NITRATE 75-25 % EX MISC
CUTANEOUS | Status: AC
  Filled 2015-01-06: qty 2

## 2015-01-06 MED ORDER — PROPOFOL 10 MG/ML IV BOLUS
INTRAVENOUS | Status: DC | PRN
  Administered 2015-01-06: 200 mg via INTRAVENOUS

## 2015-01-06 MED ORDER — MIDAZOLAM HCL 2 MG/2ML IJ SOLN
INTRAMUSCULAR | Status: DC | PRN
Start: 2015-01-06 — End: 2015-01-06
  Administered 2015-01-06: 2 mg via INTRAVENOUS

## 2015-01-06 MED ORDER — LIDOCAINE HCL (CARDIAC) 20 MG/ML IV SOLN
INTRAVENOUS | Status: DC | PRN
  Administered 2015-01-06: 50 mg via INTRAVENOUS

## 2015-01-06 MED ORDER — LIDOCAINE HCL (PF) 1 % IJ SOLN
INTRAMUSCULAR | Status: AC
  Filled 2015-01-06: qty 30

## 2015-01-06 SURGICAL SUPPLY — 60 items
ABLATOR ENDOMETRIAL MYOSURE (ABLATOR) ×3 IMPLANT
BAG URO DRAIN 2000ML W/SPOUT (MISCELLANEOUS) ×2 IMPLANT
BLADE SURG SZ11 CARB STEEL (BLADE) ×3 IMPLANT
CANISTER SUC SOCK COL 7IN (MISCELLANEOUS) ×2 IMPLANT
CANISTER SUCT 1200ML W/VALVE (MISCELLANEOUS) ×3 IMPLANT
CATH FOLEY 2WAY  5CC 16FR (CATHETERS)
CATH FOLEY 2WAY 5CC 16FR (CATHETERS)
CATH ROBINSON RED A/P 16FR (CATHETERS) ×3 IMPLANT
CATH URTH 16FR FL 2W BLN LF (CATHETERS) ×2 IMPLANT
CHLORAPREP W/TINT 26ML (MISCELLANEOUS) ×3 IMPLANT
CLIP FILSHIE TUBAL LIGA STRL (Clip) ×1 IMPLANT
DEFOGGER SCOPE WARMER CLEARIFY (MISCELLANEOUS) ×3 IMPLANT
DRESSING TELFA 4X3 1S ST N-ADH (GAUZE/BANDAGES/DRESSINGS) ×3 IMPLANT
DRSG TEGADERM 2-3/8X2-3/4 SM (GAUZE/BANDAGES/DRESSINGS) ×7 IMPLANT
ENDOPOUCH RETRIEVER 10 (MISCELLANEOUS) ×2 IMPLANT
GAUZE SPONGE NON-WVN 2X2 STRL (MISCELLANEOUS) ×4 IMPLANT
GLOVE BIO SURGEON STRL SZ7 (GLOVE) ×7 IMPLANT
GLOVE INDICATOR 7.5 STRL GRN (GLOVE) ×4 IMPLANT
GOWN STRL REUS W/ TWL LRG LVL3 (GOWN DISPOSABLE) ×2 IMPLANT
GOWN STRL REUS W/ TWL XL LVL3 (GOWN DISPOSABLE) ×2 IMPLANT
GOWN STRL REUS W/TWL LRG LVL3 (GOWN DISPOSABLE) ×3
GOWN STRL REUS W/TWL XL LVL3 (GOWN DISPOSABLE) ×3
GRASPER SUT TROCAR 14GX15 (MISCELLANEOUS) ×2 IMPLANT
IRRIGATION STRYKERFLOW (MISCELLANEOUS) ×2 IMPLANT
IRRIGATOR STRYKERFLOW (MISCELLANEOUS)
IV LACTATED RINGERS 1000ML (IV SOLUTION) ×3 IMPLANT
KIT RM TURNOVER CYSTO AR (KITS) ×3 IMPLANT
KIT RM TURNOVER STRD PROC AR (KITS) ×2 IMPLANT
LABEL OR SOLS (LABEL) ×3 IMPLANT
LIGASURE BLUNT 5MM 37CM (INSTRUMENTS) ×2 IMPLANT
LIQUID BAND (GAUZE/BANDAGES/DRESSINGS) ×3 IMPLANT
MYOSURE LITE POLYP REMOVAL (MISCELLANEOUS) ×2 IMPLANT
NDL SPNL 22GX3.5 QUINCKE BK (NEEDLE) ×2 IMPLANT
NEEDLE SPNL 22GX3.5 QUINCKE BK (NEEDLE) ×3 IMPLANT
NS IRRIG 500ML POUR BTL (IV SOLUTION) ×3 IMPLANT
PACK DNC HYST (MISCELLANEOUS) ×2 IMPLANT
PACK GYN LAPAROSCOPIC (MISCELLANEOUS) ×3 IMPLANT
PAD OB MATERNITY 4.3X12.25 (PERSONAL CARE ITEMS) ×3 IMPLANT
PAD PREP 24X41 OB/GYN DISP (PERSONAL CARE ITEMS) ×2 IMPLANT
PAD TELFA 2X3 NADH STRL (GAUZE/BANDAGES/DRESSINGS) ×1 IMPLANT
SCISSORS METZENBAUM CVD 33 (INSTRUMENTS) ×2 IMPLANT
SOL .9 NS 3000ML IRR  AL (IV SOLUTION)
SOL .9 NS 3000ML IRR AL (IV SOLUTION)
SOL .9 NS 3000ML IRR UROMATIC (IV SOLUTION) ×2 IMPLANT
SOL ANTI-FOG 6CC FOG-OUT (MISCELLANEOUS) IMPLANT
SOL FOG-OUT ANTI-FOG 6CC (MISCELLANEOUS) ×1
SPONGE VERSALON 2X2 STRL (MISCELLANEOUS) ×6
SUT MNCRL AB 4-0 PS2 18 (SUTURE) ×1 IMPLANT
SUT VIC AB 2-0 UR6 27 (SUTURE) ×3 IMPLANT
SUT VIC AB 4-0 PS2 18 (SUTURE) ×3 IMPLANT
SUT VICRYL 0 AB UR-6 (SUTURE) ×2 IMPLANT
SYR 50ML LL SCALE MARK (SYRINGE) ×1 IMPLANT
SYRINGE 10CC LL (SYRINGE) ×3 IMPLANT
TOWEL OR 17X26 4PK STRL BLUE (TOWEL DISPOSABLE) ×3 IMPLANT
TROCAR XCEL NON-BLD 11X100MML (ENDOMECHANICALS) ×1 IMPLANT
TROCAR XCEL NON-BLD 5MMX100MML (ENDOMECHANICALS) ×1 IMPLANT
TROCAR XCEL UNIV SLVE 11M 100M (ENDOMECHANICALS) ×3 IMPLANT
TUBING CONNECTING 10 (TUBING) ×3 IMPLANT
TUBING HYSTEROSCOPY DOLPHIN (MISCELLANEOUS) ×2 IMPLANT
TUBING INSUFFLATOR HI FLOW (MISCELLANEOUS) ×3 IMPLANT

## 2015-01-06 NOTE — Op Note (Signed)
Operative Note   01/06/2015  PRE-OP DIAGNOSIS: Desire for permanent sterilization. Abnormal Uterine Bleeding   POST-OP DIAGNOSIS: Same   SURGEON: Surgeon(s) and Role:    * [place] Bing, MD - Primary  ASSISTANT: none  ANESTHESIA: General, paracervical and local  PROCEDURE: LAPAROSCOPIC BILATERAL TUBAL LIGATION with FILSHIE CLIPS  HYSTEROSCOPY AND ENDOMETRIAL ABLATION VIA NOVASURE  ESTIMATED BLOOD LOSS: 25mL  DRAINS: I/O UOP of 10mL at the start of the case   FLUID DEFICIT: 50mL  TOTAL IV FLUIDS: crystalloid  SPECIMENS: none  VTE PROPHYLAXIS: SCDs to the bilateral lower extremities  ANTIBIOTICS: not indicated  COMPLICATIONS: none  DISPOSITION: PACU - hemodynamically stable.  CONDITION: stable  FINDINGS: Exam under anesthesia revealed an anteverted uterus approximately 8 week size, normal shape, and no adnexal masses; she sounded to 8cm with 4cm cervical length. On hysteroscopy, grossly normal uterine cavity with bilateral ostia seen and normal endocervical canal. After the ablation, uniform burning was noted from the ostia, and fundus down to the internal os.  Laparoscopic survey of the abdomen revealed a grossly normal uterus, tubes, ovaries, liver and stomach edge. no intra-abdominal adhesions were noted except for slight filmy adhesions near the right para-colic gutter in the area of the cecum, just above the pelvic brim. No changes from the above findings were noted after the ablation.    PROCEDURE IN DETAIL: The patient was taken to the OR where anesthesia was administed. The patient was positioned in dorsal lithotomy in the Benton stirrups. The patient was then examined under anesthesia with the above noted findings. The patient was prepped and draped in the normal sterile fashion.  The patient's abdomen was injected with local anesthesia and a 10 mm skin incision was made in the umbilical fold.  Using the open technique, the fascia was tagged and the 12mm trocar  and laparoscope was introduced with the above noted findings after insufflation.  Attention was then turned below, after the patient was placed into Trendelenburg, a red rubber was used to drain the bladder. A Graves speculum was placed in the vagina and the anterior lip of the cervix was grasped with a single toothed tenaculum. 20mL of 1% lidocaine was used for a paracervical block. The cervix was then measured (4cm) and the uterus was then totally sounded (8cm) for a cavity length of 4cm. The hysteroscope was then introduced with the above noted findings. This was then removed and the Novasure device deployed and use, per the manufacturer's instructions:  Cavity width 4cm, power 88, time 99 seconds. This was then removed and the hysteroscope reintroduced with the above noted findings.  This was removed and a hulka clamp placed and the speculum removed.  Attention was then turned back to the abdomen, after changing of gloves. Under direct visualization, a 10mm suprapubic port was placed, after injection of local anesthesia. The tubes were then teased out to their respective fimbriae and the filshie clips placed bilaterally and without issue.    All instruments and ports were then removed from the abdomen. The fascia at the umbilical incision was reapproximated with 0 vicryl, as well as at the suprapubic port. The skin was closed with 4-0 monocryl and dermabond at both. The Hulka was removed with no bleeding noted from the cervix and all other instrumentation was removed from the vagina.  The patient tolerated the procedure well. All counts were correct x 2. The patient was transferred to the recovery room awake, alert and breathing independently.   Cornelia Copa MD [facility]  OBGYN  Pager: [phone]

## 2015-01-06 NOTE — Anesthesia Preprocedure Evaluation (Addendum)
Anesthesia Evaluation  Patient identified by MRN, date of birth, ID band Patient awake    Reviewed: Allergy & Precautions, NPO status , Patient's Chart, lab work & pertinent test results  Airway Mallampati: II  TM Distance: >3 FB Neck ROM: Full    Dental   Pulmonary asthma (remote) , Current Smoker (1/2 ppd),          Cardiovascular     Neuro/Psych  Headaches, Anxiety    GI/Hepatic   Endo/Other    Renal/GU      Musculoskeletal   Abdominal   Peds  Hematology   Anesthesia Other Findings   Reproductive/Obstetrics                            Anesthesia Physical Anesthesia Plan  ASA: II  Anesthesia Plan: General   Post-op Pain Management:    Induction:   Airway Management Planned: Oral ETT  Additional Equipment:   Intra-op Plan:   Post-operative Plan:   Informed Consent: I have reviewed the patients History and Physical, chart, labs and discussed the procedure including the risks, benefits and alternatives for the proposed anesthesia with the patient or authorized representative who has indicated his/her understanding and acceptance.     Plan Discussed with:   Anesthesia Plan Comments:         Anesthesia Quick Evaluation

## 2015-01-06 NOTE — Anesthesia Postprocedure Evaluation (Signed)
  Anesthesia Post-op Note  Patient: Michelle Rhodes  Procedure(s) Performed: Procedure(s): LAPAROSCOPIC BILATERAL TUBAL LIGATION with FILSIE CLIPS  (Bilateral) ENDOMETRIAL ABLATION (N/A)  Anesthesia type:General  Patient location: PACU  Post pain: Pain level controlled  Post assessment: Post-op Vital signs reviewed, Patient's Cardiovascular Status Stable, Respiratory Function Stable, Patent Airway and No signs of Nausea or vomiting  Post vital signs: Reviewed and stable  Last Vitals:  Filed Vitals:   01/06/15 1712  BP: 110/71  Pulse: 88  Temp: 37.2 C  Resp: 14    Level of consciousness: awake, alert  and patient cooperative  Complications: No apparent anesthesia complications

## 2015-01-06 NOTE — Discharge Instructions (Addendum)
[facility] Laparoscopic Surgery Discharge Instructions   You have just undergone a major laparoscopic surgery.  The following list should answer your most common questions.  Although we will discuss your surgery and post-operative instructions with you prior to your discharge, this list will serve as a reminder if you fail to recall the details of what we discussed.  We will discuss your surgery once again in detail at your post-op visit in  four weeks. If you havent already done so, please call to make your appointment as soon as possible.  How you will feel: Although you have just undergone a major surgery, your recovery will be significantly shorter since the surgery was performed through much smaller incisions than the traditional approach.  You should feel slightly better each day.  If you suddenly feel much worse than the prior day, please call the clinic.  Its important during the early part of your recovery that you maintain some activity.  Walking is encouraged.  You will quicken your recovery by continued activity.  Incision:  Your incisions will be closed with dissolvable stitches and surgical adhesive (glue).  There may be Band-aids and/or Steri-strips covering your incisions.  If there is no drainage from the incisions you may remove the Band-aids in one to two days.  You may notice some minor bruising at the incision sites.  This is common and will resolve within several days.  Please inform us if the redness at the edges of your incision appears to be spreading.  If the skin around your incision becomes warm to the touch, or if you notice a pus-like drainage, please call the office.  Vaginal Discharge Following a Laparoscopic Surgery: Minor vaginal bleeding or spotting is normal but bleeding similar to the amount of your period is excessive, and you should inform us of this immediately.  Vaginal spotting may continue for several weeks following your surgery.  You may notice a  yellowish discharge which is normal and may last for several weeks.   Sexual Activity: Do not have sexual intercourse or place tampons or douches in the vagina or do any tub bathing or swimming pools prior to your first office visit.  We will discuss when you may resume these activities at that visit.    Stairs/Driving/Activities: You may climb stairs if necessary.  If youve had general anesthesia, do not drive a car the rest of the day today.  You may begin light housework when you feel up to it, but avoid heavy lifting (more than 20lbs) or pushing until cleared for these activities by your physician.  Hygiene:  Do not soak your incisions.  Showers are acceptable but you may not take a bath or swim in a pool.  Cleanse your incisions (don't scrub) daily with soap and water.  Medications:  Please resume taking any medications that you were taking prior to the surgery.  If we have prescribed any new medications for you, please take them as directed.  Constipation:  It is fairly common to experience some difficulty in moving your bowels following major surgery.  Being active will help to reduce this likelihood. A diet rich in fiber and plenty of liquids is desirable.  If you do become constipated, a mild laxative such as Miralax, Milk of Magnesia, or Metamucil, or a stool softener such as Colace, is recommended.  General Instructions:  If you develop a fever of 100.5 degrees or higher, please call [phone] daytime, or [phone] after hours and ask for the [facility]  physician on call.  [place] (Main) [address] [place], [place] [address] Phone: [phone] Fax: [phone]  [place] [address] [place], [place] [address] Phone: [phone] Fax: [phone]  AMBULATORY SURGERY  DISCHARGE INSTRUCTIONS   1) The drugs that you were given will stay in your system until tomorrow so for the next 24 hours you should not:  A) Drive an automobile B) Make any legal decisions C) Drink  any alcoholic beverage   2) You may resume regular meals tomorrow.  Today it is better to start with liquids and gradually work up to solid foods.  You may eat anything you prefer, but it is better to start with liquids, then soup and crackers, and gradually work up to solid foods.   3) Please notify your doctor immediately if you have any unusual bleeding, trouble breathing, redness and pain at the surgery site, drainage, fever, or pain not relieved by [facility] Main Number [phone]

## 2015-01-06 NOTE — H&P (Signed)
H&P Office H&P reviewed and all questions asked and answered with the patient. Will proceed with hysteroscopy, novasure ablation and BTL for AUB and desire for permanent sterilization when all parties are ready.   Cornelia Copa MD [facility]  Pager: [phone]

## 2015-01-06 NOTE — Anesthesia Procedure Notes (Signed)
Procedure Name: Intubation Date/Time: 01/06/2015 3:19 PM Performed by: Sherron Flemings Pre-anesthesia Checklist: Patient identified, Patient being monitored, Timeout performed, Emergency Drugs available and Suction available Patient Re-evaluated:Patient Re-evaluated prior to inductionOxygen Delivery Method: Circle system utilized Preoxygenation: Pre-oxygenation with 100% oxygen Intubation Type: IV induction Ventilation: Mask ventilation without difficulty Laryngoscope Size: Mac and 3 Grade View: Grade I Tube type: Oral Tube size: 7.5 mm Number of attempts: 1 Placement Confirmation: ETT inserted through vocal cords under direct vision,  positive ETCO2 and breath sounds checked- equal and bilateral Secured at: 20 cm Tube secured with: Tape Dental Injury: Teeth and Oropharynx as per pre-operative assessment

## 2015-01-06 NOTE — Transfer of Care (Signed)
Immediate Anesthesia Transfer of Care Note  Patient: Michelle Rhodes  Procedure(s) Performed: Procedure(s): LAPAROSCOPIC BILATERAL TUBAL LIGATION with FILSIE CLIPS  (Bilateral) ENDOMETRIAL ABLATION (N/A)  Patient Location: PACU  Anesthesia Type:General  Level of Consciousness: Alert, Awake, Oriented  Airway & Oxygen Therapy: Patient Spontanous Breathing  Post-op Assessment: Report given to RN  Post vital signs: Reviewed and stable  Last Vitals:  Filed Vitals:   01/06/15 1712  BP: 110/71  Pulse: 88  Temp: 37.2 C  Resp: 14    Complications: No apparent anesthesia complications

## 2015-01-07 ENCOUNTER — Encounter: Payer: Self-pay | Admitting: Obstetrics and Gynecology

## 2015-01-13 ENCOUNTER — Ambulatory Visit (INDEPENDENT_AMBULATORY_CARE_PROVIDER_SITE_OTHER): Payer: Medicaid Other | Admitting: Neurology

## 2015-01-13 ENCOUNTER — Encounter: Payer: Self-pay | Admitting: Neurology

## 2015-01-13 VITALS — BP 93/69 | HR 64 | Temp 98.0°F | Ht 64.0 in | Wt 132.6 lb

## 2015-01-13 DIAGNOSIS — G43001 Migraine without aura, not intractable, with status migrainosus: Secondary | ICD-10-CM

## 2015-01-13 DIAGNOSIS — M542 Cervicalgia: Secondary | ICD-10-CM | POA: Insufficient documentation

## 2015-01-13 DIAGNOSIS — R930 Abnormal findings on diagnostic imaging of skull and head, not elsewhere classified: Secondary | ICD-10-CM

## 2015-01-13 DIAGNOSIS — R9082 White matter disease, unspecified: Secondary | ICD-10-CM

## 2015-01-13 DIAGNOSIS — G5600 Carpal tunnel syndrome, unspecified upper limb: Secondary | ICD-10-CM | POA: Insufficient documentation

## 2015-01-13 DIAGNOSIS — G5602 Carpal tunnel syndrome, left upper limb: Secondary | ICD-10-CM

## 2015-01-13 DIAGNOSIS — G43711 Chronic migraine without aura, intractable, with status migrainosus: Secondary | ICD-10-CM | POA: Insufficient documentation

## 2015-01-13 DIAGNOSIS — G441 Vascular headache, not elsewhere classified: Secondary | ICD-10-CM | POA: Diagnosis not present

## 2015-01-13 MED ORDER — SUMATRIPTAN-NAPROXEN SODIUM 85-500 MG PO TABS: 1.0000 | ORAL_TABLET | ORAL | Status: DC | PRN

## 2015-01-13 NOTE — Patient Instructions (Signed)
Overall you are doing fairly well but I do want to suggest a few things today:   Remember to drink plenty of fluid, eat healthy meals and do not skip any meals. Try to eat protein with a every meal and eat a healthy snack such as fruit or nuts in between meals. Try to keep a regular sleep-wake schedule and try to exercise daily, particularly in the form of walking, 20-30 minutes a day, if you can.   As far as your medications are concerned, I would like to suggest: Can try Treximet at onset of next headache. Can repeat in 2 hours if needed. Do not take with imitrex. May 2 in one day or 4 in one week.  As far as diagnostic testing: emg/ncs, physical therapy  I would like to see you back for emg/ncs, sooner if we need to. Please call us with any interim questions, concerns, problems, updates or refill requests.   Please also call us for any test results so we can go over those with you on the phone.  My clinical assistant and will answer any of your questions and relay your messages to me and also relay most of my messages to you.   Our phone number is [phone]. We also have an after hours call service for urgent matters and there is a physician on-call for urgent questions. For any emergencies you know to call 911 or go to the nearest emergency room

## 2015-01-13 NOTE — Progress Notes (Signed)
[facility]    Provider:  Dr Lucia Gaskins Referring Provider: Harlene Ramus, [place] Primary Care Physician:  [place], [place]  CC:  Neck pain and numbness, migraines.  HPI:  Michelle Rhodes is a 34 y.o. female here as a referral from Dr. Harlene Ramus for neck pain and numbness, migraines.  Migraines started at the age of 49. Migraines are worsening, always on the left side side, pulsating and throbbing, light sensitivity, sound sensitivity, 10/10 pain, they last all day and have lasted 28 days. She has to go into a dark room. She gets neck pain on the left side. She has tingling in the left hand which radiates  up the arm. Migraines are once a year and can last a month. Last migraine 2 weeks ago for about 2 days, last extended migraine last July. Takes imitrex which doesn't work. She has been to the emergency room in the past but otherwise just sits in her room for 30 days. No aura.   Left arm: She has tingling of the left fingers. Happens sporadically. She wakes up with numbness in the hand all the time. No grip weakness. She has tightness in the neck but no radicular symptoms.   Reviewed notes, labs and imaging from outside physicians, which showed:   IMPRESSIO MRI of the brain (personally reviewed images) : 1. Stable nonspecific white matter signal changes without enhancement in the anterior frontal lobe. Top differential considerations remain sequelae of migraines, demyelination, and trauma. See comparison for additional diagnostic considerations. 2. 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 Recommend 1 year follow up to document stability.  Review of Systems: Patient complains of symptoms per HPI as well as the following symptoms: Headache, numbness, weakness, passing out, anxiety, decreased energy. Pertinent negatives per HPI. All others negative.   History   Social History  . Marital Status: Single    Spouse Name: N/A  . Number of Children: 1    . Years of Education: 12   Occupational History  . Not on file.   Social History Main Topics  . Smoking status: Current Every Day Smoker -- 0.25 packs/day for 10 years    Types: Cigarettes  . Smokeless tobacco: Never Used  . Alcohol Use: No  . Drug Use: No  . Sexual Activity: Yes   Other Topics Concern  . Not on file   Social History Narrative   Lives at home with son.   Caffeine use: Drinks diet Mt. Dew soda (4 cans per day)       Family History  Problem Relation Age of Onset  . Hypertension Mother   . Stroke Mother   . Hypertension Father   . Migraines Neg Hx     Past Medical History  Diagnosis Date  . Migraine   . Asthma     as a child  . Anxiety     Past Surgical History  Procedure Laterality Date  . Laparoscopic bilateral salpingo oopherectomy Bilateral 01/06/2015    Procedure: LAPAROSCOPIC BILATERAL TUBAL LIGATION with FILSIE CLIPS ;  Surgeon: [place] Bing, MD;  Location: [facility] ORS;  Service: Gynecology;  Laterality: Bilateral;  . Endometrial ablation N/A 01/06/2015    Procedure: ENDOMETRIAL ABLATION;  Surgeon: [place] Bing, MD;  Location: [facility] ORS;  Service: Gynecology;  Laterality: N/A;    Current Outpatient Prescriptions  Medication Sig Dispense Refill  . acetaminophen (TYLENOL) 500 MG tablet Take 1,000 mg by mouth every 6 (six) hours as needed for headache. pain    .  docusate sodium (COLACE) 100 MG capsule Take 1 capsule (100 mg total) by mouth 2 (two) times daily. 30 capsule 1  . ibuprofen (ADVIL) 200 MG tablet Take 1 tablet (200 mg total) by mouth as directed. (Patient taking differently: Take 600 mg by mouth as directed. ) 30 tablet 0  . oxyCODONE-acetaminophen (ROXICET) 5-325 MG per tablet Take 1 tablet by mouth every 6 (six) hours as needed for severe pain. 10 tablet 0  . SUMAtriptan (IMITREX) 100 MG tablet Take 100 mg by mouth once. May repeat in 2 hours if headache persists or recurs.    . sertraline (ZOLOFT) 50 MG tablet Take 50 mg by mouth  daily.    . SUMAtriptan-naproxen (TREXIMET) 85-500 MG per tablet Take 1 tablet by mouth every 2 (two) hours as needed for migraine. 3 tablet 0   No current facility-administered medications for this visit.    Allergies as of 01/13/2015  . (No Known Allergies)    Vitals: BP 93/69 mmHg  Pulse 64  Temp(Src) 98 F (36.7 C)  Ht  (1.626 m)  Wt 132 lb 9.6 oz (60.147 kg)  BMI 22.75 kg/m2  LMP 12/20/2014 (Exact Date) Last Weight:  Wt Readings from Last 1 Encounters:  01/13/15 132 lb 9.6 oz (60.147 kg)   Last Height:   Ht Readings from Last 1 Encounters:  01/13/15  (1.626 m)    Physical exam: Exam: Gen: NAD, conversant, well nourised, well groomed                     CV: RRR, no MRG. No Carotid Bruits. No peripheral edema, warm, nontender Eyes: Conjunctivae clear without exudates or hemorrhage  Neuro: Detailed Neurologic Exam  Speech:    Speech is normal; fluent and spontaneous with normal comprehension.  Cognition:    The patient is oriented to person, place, and time;     recent and remote memory intact;     language fluent;     normal attention, concentration,     fund of knowledge Cranial Nerves:    The pupils are equal, round, and reactive to light. The fundi are normal and spontaneous venous pulsations are present. Visual fields are full to finger confrontation. Extraocular movements are intact. Trigeminal sensation is intact and the muscles of mastication are normal. The face is symmetric. The palate elevates in the midline. Hearing intact. Voice is normal. Shoulder shrug is normal. The tongue has normal motion without fasciculations.   Coordination:    Normal finger to nose and heel to shin. Normal rapid alternating movements.   Gait:    Heel-toe and tandem gait are normal.   Motor Observation:    No asymmetry, no atrophy, and no involuntary movements noted. Tone:    Normal muscle tone.    Posture:    Posture is normal. normal erect    Strength:     Strength is V/V in the upper and lower limbs.      Sensation: intact to LT     Reflex Exam:  DTR's:    Deep tendon reflexes in the upper and lower extremities are normal bilaterally.   Toes:    The toes are downgoing bilaterally.   Clonus:    Clonus is absent.   Assessment/Plan:  34 year old female with a past medical history of migraines, abnormal white matter changes on last MRI in 2011, left arm and hand pain and paresthesias.  Will repeat MRI of the brain to follow abnormal white matter changes seen  in 2011. Will order EMG nerve conduction study to evaluate paresthesias in the hand. Will order BMP Referral to physical therapy for musculoskeletal neck pain Try Treximet or Imitrex at onset of migraine for acute management   Naomie Dean, MD  [facility] [address] [place], [place] [address]  Phone [phone] Fax [phone]

## 2015-01-14 ENCOUNTER — Encounter: Payer: Self-pay | Admitting: Neurology

## 2015-01-14 ENCOUNTER — Telehealth: Payer: Self-pay | Admitting: Neurology

## 2015-01-14 DIAGNOSIS — R9082 White matter disease, unspecified: Secondary | ICD-10-CM | POA: Insufficient documentation

## 2015-01-14 DIAGNOSIS — G441 Vascular headache, not elsewhere classified: Secondary | ICD-10-CM

## 2015-01-14 DIAGNOSIS — R51 Headache: Secondary | ICD-10-CM

## 2015-01-14 DIAGNOSIS — R519 Headache, unspecified: Secondary | ICD-10-CM | POA: Insufficient documentation

## 2015-01-14 NOTE — Telephone Encounter (Signed)
Michelle Rhodes - Would you please call patient and let her know that I reviewed the MRi of her brain from 2011 and think we should repeat it. I also want one lab, she can have it done in the office before her EMG on the 16th. thanks

## 2015-01-17 NOTE — Telephone Encounter (Signed)
Left VM for pt to call us back. Gave [facility] phone number and office hours.

## 2015-01-17 NOTE — Telephone Encounter (Signed)
Michelle Rhodes is returning your call.  Please call her back [phone].  Thanks!

## 2015-01-18 NOTE — Telephone Encounter (Signed)
Spoke with pt to let her know Dr. Lucia Gaskins would like her to repeat MRI brain after reviewing one from 2011. Pt stated she set that up today for 01/25/15 at 9:00am. Told her she also needed more lab work and she can get this done on 01/27/15 before her EMG study when she comes to our office. Pt verbalized understanding.

## 2015-01-25 ENCOUNTER — Other Ambulatory Visit: Payer: Medicaid Other

## 2015-01-27 ENCOUNTER — Ambulatory Visit (INDEPENDENT_AMBULATORY_CARE_PROVIDER_SITE_OTHER): Payer: Medicaid Other | Admitting: Neurology

## 2015-01-27 ENCOUNTER — Other Ambulatory Visit (INDEPENDENT_AMBULATORY_CARE_PROVIDER_SITE_OTHER): Payer: Self-pay

## 2015-01-27 ENCOUNTER — Other Ambulatory Visit: Payer: Self-pay | Admitting: Neurology

## 2015-01-27 ENCOUNTER — Ambulatory Visit (INDEPENDENT_AMBULATORY_CARE_PROVIDER_SITE_OTHER): Payer: Self-pay | Admitting: Neurology

## 2015-01-27 DIAGNOSIS — G441 Vascular headache, not elsewhere classified: Secondary | ICD-10-CM

## 2015-01-27 DIAGNOSIS — G939 Disorder of brain, unspecified: Secondary | ICD-10-CM

## 2015-01-27 DIAGNOSIS — G9389 Other specified disorders of brain: Secondary | ICD-10-CM

## 2015-01-27 DIAGNOSIS — Z0289 Encounter for other administrative examinations: Secondary | ICD-10-CM

## 2015-01-27 DIAGNOSIS — G5602 Carpal tunnel syndrome, left upper limb: Secondary | ICD-10-CM

## 2015-01-27 DIAGNOSIS — G35 Multiple sclerosis: Secondary | ICD-10-CM

## 2015-01-28 LAB — COMPREHENSIVE METABOLIC PANEL
ALBUMIN: 4.7 g/dL (ref 3.5–5.5)
ALK PHOS: 43 IU/L (ref 39–117)
ALT: 8 IU/L (ref 0–32)
AST: 15 IU/L (ref 0–40)
Albumin/Globulin Ratio: 2.4 (ref 1.1–2.5)
BILIRUBIN TOTAL: 0.2 mg/dL (ref 0.0–1.2)
BUN/Creatinine Ratio: 18 (ref 8–20)
BUN: 13 mg/dL (ref 6–20)
CO2: 19 mmol/L (ref 18–29)
Calcium: 9.8 mg/dL (ref 8.7–10.2)
Chloride: 102 mmol/L (ref 97–108)
Creatinine, Ser: 0.72 mg/dL (ref 0.57–1.00)
GFR calc Af Amer: 128 mL/min/{1.73_m2} (ref >59)
GFR, EST NON AFRICAN AMERICAN: 111 mL/min/{1.73_m2} (ref >59)
GLUCOSE: 83 mg/dL (ref 65–99)
Globulin, Total: 2 g/dL (ref 1.5–4.5)
POTASSIUM: 4.1 mmol/L (ref 3.5–5.2)
SODIUM: 140 mmol/L (ref 134–144)
Total Protein: 6.7 g/dL (ref 6.0–8.5)

## 2015-01-29 NOTE — Progress Notes (Signed)
See procedure note.

## 2015-01-30 NOTE — Progress Notes (Signed)
  [facility]    Provider:  Dr Lucia Gaskins Referring Provider: Harlene Ramus [place] Primary Care Physician:  [place]apps, [place]  History:  Michelle Rhodes is a 34 y.o. female here for evaluation of tingling in the left hand which radiates up the arm. She wakes up with numbness in the hand all the time. Denies grip weakness. She has tightness in the neck but no radicular symptoms.  Summary: Nerve Conduction studies were performed on the bilateral upper extremities.  ADM Ulnar and APB Median motor conductions were within normal limits with normal F wave latencies.  Second digit Median, fifth digit Ulnar and Radial sensory conductions were within normal limits.  The bilateral median/ulnar palmar comparison studies showed normal onset latency difference   EMG needle evaluation was prematurely terminated at patient's request. The left deltoid was within normal limits, patient could not tolerate due to pain.  Conclusion: Limited study due to premature termination of EMG needle exam due to pain. No electrophysiologic evidence for median or ulnar neuropathy or cervical radiculopathy.     Naomie Dean, MD  [facility] [address] [place], [place] [address]  Phone [phone] Fax [phone]

## 2015-01-30 NOTE — Procedures (Signed)
[facility]    Provider:  Dr Lucia Gaskins Referring Provider: Harlene Ramus [place] Primary Care Physician:  [place], [place]  History:  Michelle Rhodes is a 34 y.o. female here for evaluation of tingling in the left hand which radiates up the arm. She wakes up with numbness in the hand all the time. Denies grip weakness. She has tightness in the neck but no radicular symptoms.  Summary: Nerve Conduction studies were performed on the bilateral upper extremities.  ADM Ulnar and APB Median motor conductions were within normal limits with normal F wave latencies.  Second digit Median, fifth digit Ulnar and Radial sensory conductions were within normal limits.  The bilateral median/ulnar palmar comparison studies showed normal onset latency difference   EMG needle evaluation was prematurely terminated at patient's request. The left deltoid was within normal limits, patient could not tolerate due to pain.  Conclusion: Limited study due to premature termination of EMG needle exam due to pain. No electrophysiologic evidence for median or ulnar neuropathy or cervical radiculopathy.     Naomie Dean, MD  [facility] [address] [place], [place] [address]  Phone [phone] Fax [phone]

## 2015-01-31 ENCOUNTER — Telehealth: Payer: Self-pay | Admitting: *Deleted

## 2015-01-31 ENCOUNTER — Other Ambulatory Visit: Payer: Medicaid Other

## 2015-01-31 NOTE — Telephone Encounter (Signed)
Left VM for pt to let her know I was calling about results. Labs were normal. Gave [facility] phone number and told her we are open until 5pm.

## 2015-01-31 NOTE — Telephone Encounter (Signed)
Patient call inquiring about lab results. I relayed to her they were normal.

## 2015-02-01 ENCOUNTER — Telehealth: Payer: Self-pay | Admitting: *Deleted

## 2015-02-01 NOTE — Telephone Encounter (Signed)
Error

## 2015-02-08 ENCOUNTER — Other Ambulatory Visit: Payer: Medicaid Other

## 2015-02-08 ENCOUNTER — Inpatient Hospital Stay: Admission: RE | Admit: 2015-02-08 | Payer: Medicaid Other | Source: Ambulatory Visit

## 2015-02-16 ENCOUNTER — Other Ambulatory Visit: Payer: Medicaid Other

## 2015-02-16 ENCOUNTER — Ambulatory Visit
Admission: RE | Admit: 2015-02-16 | Discharge: 2015-02-16 | Disposition: A | Discharge status: Home / Self Care | Payer: Medicaid Other | Source: Ambulatory Visit | Attending: Neurology | Admitting: Neurology

## 2015-02-16 DIAGNOSIS — G441 Vascular headache, not elsewhere classified: Secondary | ICD-10-CM

## 2015-02-16 DIAGNOSIS — R9082 White matter disease, unspecified: Secondary | ICD-10-CM

## 2015-07-25 ENCOUNTER — Ambulatory Visit: Payer: Medicaid Other | Admitting: Physical Therapy

## 2015-12-23 ENCOUNTER — Emergency Department (HOSPITAL_COMMUNITY)
Admission: EM | Admit: 2015-12-23 | Discharge: 2015-12-23 | Disposition: A | Discharge status: Home / Self Care | Payer: Medicaid Other | Attending: Emergency Medicine | Admitting: Emergency Medicine

## 2015-12-23 ENCOUNTER — Encounter (HOSPITAL_COMMUNITY): Payer: Self-pay

## 2015-12-23 DIAGNOSIS — F17210 Nicotine dependence, cigarettes, uncomplicated: Secondary | ICD-10-CM | POA: Insufficient documentation

## 2015-12-23 DIAGNOSIS — J45909 Unspecified asthma, uncomplicated: Secondary | ICD-10-CM | POA: Insufficient documentation

## 2015-12-23 DIAGNOSIS — Z79899 Other long term (current) drug therapy: Secondary | ICD-10-CM | POA: Diagnosis not present

## 2015-12-23 DIAGNOSIS — R51 Headache: Secondary | ICD-10-CM | POA: Diagnosis present

## 2015-12-23 DIAGNOSIS — G43809 Other migraine, not intractable, without status migrainosus: Secondary | ICD-10-CM

## 2015-12-23 DIAGNOSIS — F419 Anxiety disorder, unspecified: Secondary | ICD-10-CM | POA: Diagnosis not present

## 2015-12-23 MED ORDER — LORAZEPAM 2 MG/ML IJ SOLN
1.0000 mg | Freq: Once | INTRAMUSCULAR | Status: AC
  Administered 2015-12-23: 1 mg via INTRAVENOUS
  Filled 2015-12-23: qty 1

## 2015-12-23 MED ORDER — SODIUM CHLORIDE 0.9 % IV SOLN
INTRAVENOUS | Status: DC
  Administered 2015-12-23: 125 mL/h via INTRAVENOUS

## 2015-12-23 MED ORDER — SODIUM CHLORIDE 0.9 % IV BOLUS (SEPSIS)
1000.0000 mL | Freq: Once | INTRAVENOUS | Status: AC
Start: 2015-12-23 — End: 2015-12-23
  Administered 2015-12-23: 1000 mL via INTRAVENOUS

## 2015-12-23 MED ORDER — DEXAMETHASONE SODIUM PHOSPHATE 10 MG/ML IJ SOLN
10.0000 mg | Freq: Once | INTRAMUSCULAR | Status: AC
  Administered 2015-12-23: 10 mg via INTRAVENOUS
  Filled 2015-12-23: qty 1

## 2015-12-23 MED ORDER — METOCLOPRAMIDE HCL 5 MG/ML IJ SOLN
10.0000 mg | Freq: Once | INTRAMUSCULAR | Status: AC
  Administered 2015-12-23: 10 mg via INTRAVENOUS
  Filled 2015-12-23: qty 2

## 2015-12-23 MED ORDER — DIPHENHYDRAMINE HCL 50 MG/ML IJ SOLN
12.5000 mg | Freq: Once | INTRAMUSCULAR | Status: AC
  Administered 2015-12-23: 12.5 mg via INTRAVENOUS
  Filled 2015-12-23: qty 1

## 2015-12-23 NOTE — ED Notes (Signed)
Pt has had migraine x 2 weeks.  Takes imitrex.  No relief. Went to MD today and given morphine with imitrex refill.  Told if pain continues to come to ED

## 2015-12-23 NOTE — ED Notes (Signed)
Patient d/c'd self care.  Family providing ride.  F/U reviewed with patient.  Patient verbalized understanding.

## 2015-12-23 NOTE — Discharge Instructions (Signed)

## 2015-12-23 NOTE — ED Provider Notes (Signed)
CSN: [id number]     Arrival date & time 12/23/15  1715 History   First MD Initiated Contact with Patient 12/23/15 1929     Chief Complaint  Patient presents with  . Migraine     (Consider location/radiation/quality/duration/timing/severity/associated sxs/prior Treatment) HPI Comments: Patient here complaining of left-sided headache 2 weeks. Has seen her doctor for this and was given a dose of morphine as well as Imitrex. His headache is similar to her prior ones. She has not had any fever but has had some phonophobia as well as photophobia. Denies any neck pain. No focal neurological deficits. Is unsure of what precipitated this. States she has had multiple evaluations in the past including brain imaging without a etiology. Nothing makes her symptoms better  Patient is a 35 y.o. female presenting with migraines. The history is provided by the patient.  Migraine    Past Medical History  Diagnosis Date  . Migraine   . Asthma     as a child  . Anxiety    Past Surgical History  Procedure Laterality Date  . Laparoscopic bilateral salpingo oopherectomy Bilateral 01/06/2015    Procedure: LAPAROSCOPIC BILATERAL TUBAL LIGATION with FILSIE CLIPS ;  Surgeon: [place] Bing, MD;  Location: [facility]S;  Service: Gynecology;  Laterality: Bilateral;  . Endometrial ablation N/A 01/06/2015    Procedure: ENDOMETRIAL ABLATION;  Surgeon: [place] Bing, MD;  Location: [facility] OR[facility];  Service: Gynecology;  Laterality: N/A;   Family History  Problem Relation Age of Onset  . Hypertension Mother   . Stroke Mother   . Hypertension Father   . Migraines Neg Hx    Social History  Substance Use Topics  . Smoking status: Current Every Day Smoker -- 0.25 packs/day for 10 years    Types: Cigarettes  . Smokeless tobacco: Never Used  . Alcohol Use: No   OB History    Gravida Para Term Preterm AB TAB SAB Ectopic Multiple Living   0 1   1     Review of Systems  All other systems reviewed and are  negative.     Allergies  Review of patient's allergies indicates no known allergies.  Home Medications   Prior to Admission medications   Medication Sig Start Date End Date Taking? Authorizing Provider  acetaminophen (TYLENOL) 500 MG tablet Take 1,000 mg by mouth every 6 (six) hours as needed for headache. pain    Historical Provider, MD  docusate sodium (COLACE) 100 MG capsule Take 1 capsule (100 mg total) by mouth 2 (two) times daily. 01/06/15   [place] Bing, MD  ibuprofen (ADVIL) 200 MG tablet Take 1 tablet (200 mg total) by mouth as directed. Patient taking differently: Take 600 mg by mouth as directed.  01/06/15   [place] Bing, MD  oxyCODONE-acetaminophen (ROXICET) 5-325 MG per tablet Take 1 tablet by mouth every 6 (six) hours as needed for severe pain. 01/06/15   [place] Bing, MD  sertraline (ZOLOFT) 50 MG tablet Take 50 mg by mouth daily.    Historical Provider, MD  SUMAtriptan (IMITREX) 100 MG tablet Take 100 mg by mouth once. May repeat in 2 hours if headache persists or recurs.    Historical Provider, MD  SUMAtriptan-naproxen (TREXIMET) 85-500 MG per tablet Take 1 tablet by mouth every 2 (two) hours as needed for migraine. 01/13/15   Anson Fret, MD   BP 117/80 mmHg  Pulse 61  Temp(Src) 98.5 F (36.9 C) (Oral)  Resp 18  SpO2 98% Physical  Exam  Constitutional: She is oriented to person, place, and time. She appears well-developed and well-nourished.  Non-toxic appearance. No distress.  HENT:  Head: Normocephalic and atraumatic.  Eyes: Conjunctivae, EOM and lids are normal. Pupils are equal, round, and reactive to light.  Neck: Normal range of motion. Neck supple. No tracheal deviation present. No thyroid mass present.  Cardiovascular: Normal rate, regular rhythm and normal heart sounds.  Exam reveals no gallop.   No murmur heard. Pulmonary/Chest: Effort normal and breath sounds normal. No stridor. No respiratory distress. She has no decreased breath sounds. She has  no wheezes. She has no rhonchi. She has no rales.  Abdominal: Soft. Normal appearance and bowel sounds are normal. She exhibits no distension. There is no tenderness. There is no rebound and no CVA tenderness.  Musculoskeletal: Normal range of motion. She exhibits no edema or tenderness.  Neurological: She is alert and oriented to person, place, and time. She has normal strength. No cranial nerve deficit or sensory deficit. GCS eye subscore is 4. GCS verbal subscore is 5. GCS motor subscore is 6.  Skin: Skin is warm and dry. No abrasion and no rash noted.  Psychiatric: She has a normal mood and affect. Her speech is normal and behavior is normal.  Nursing note and vitals reviewed.   ED Course  Procedures (including critical care time) Labs Review Labs Reviewed - No data to display  Imaging Review No results found. I have personally reviewed and evaluated these images and lab results as part of my medical decision-making.   EKG Interpretation None      MDM   Final diagnoses:  None    Patient feels better after receiving medications for migraine. We'll follow-up with her Dr.    Lorre Nick, MD 12/23/15 2153

## 2015-12-27 ENCOUNTER — Telehealth (HOSPITAL_BASED_OUTPATIENT_CLINIC_OR_DEPARTMENT_OTHER): Payer: Self-pay | Admitting: Emergency Medicine

## 2015-12-29 DIAGNOSIS — G43809 Other migraine, not intractable, without status migrainosus: Secondary | ICD-10-CM | POA: Insufficient documentation

## 2015-12-29 DIAGNOSIS — J45909 Unspecified asthma, uncomplicated: Secondary | ICD-10-CM | POA: Insufficient documentation

## 2015-12-29 DIAGNOSIS — G43909 Migraine, unspecified, not intractable, without status migrainosus: Secondary | ICD-10-CM | POA: Diagnosis present

## 2015-12-29 DIAGNOSIS — F17210 Nicotine dependence, cigarettes, uncomplicated: Secondary | ICD-10-CM | POA: Diagnosis not present

## 2015-12-29 DIAGNOSIS — Z79899 Other long term (current) drug therapy: Secondary | ICD-10-CM | POA: Insufficient documentation

## 2015-12-30 ENCOUNTER — Encounter (HOSPITAL_COMMUNITY): Payer: Self-pay

## 2015-12-30 ENCOUNTER — Emergency Department (HOSPITAL_COMMUNITY)
Admission: EM | Admit: 2015-12-30 | Discharge: 2015-12-30 | Disposition: A | Discharge status: Home / Self Care | Payer: Medicaid Other | Attending: Emergency Medicine | Admitting: Emergency Medicine

## 2015-12-30 DIAGNOSIS — G43809 Other migraine, not intractable, without status migrainosus: Secondary | ICD-10-CM

## 2015-12-30 MED ORDER — DIPHENHYDRAMINE HCL 50 MG/ML IJ SOLN
25.0000 mg | Freq: Once | INTRAMUSCULAR | Status: AC
  Administered 2015-12-30: 25 mg via INTRAVENOUS
  Filled 2015-12-30: qty 1

## 2015-12-30 MED ORDER — PROCHLORPERAZINE EDISYLATE 5 MG/ML IJ SOLN
10.0000 mg | Freq: Once | INTRAMUSCULAR | Status: AC
  Administered 2015-12-30: 10 mg via INTRAVENOUS
  Filled 2015-12-30: qty 2

## 2015-12-30 MED ORDER — SODIUM CHLORIDE 0.9 % IV BOLUS (SEPSIS)
1000.0000 mL | Freq: Once | INTRAVENOUS | Status: AC
  Administered 2015-12-30: 1000 mL via INTRAVENOUS

## 2015-12-30 MED ORDER — KETOROLAC TROMETHAMINE 30 MG/ML IJ SOLN
30.0000 mg | Freq: Once | INTRAMUSCULAR | Status: AC
  Administered 2015-12-30: 30 mg via INTRAVENOUS
  Filled 2015-12-30: qty 1

## 2015-12-30 NOTE — ED Provider Notes (Signed)
CSN: [id number]     Arrival date & time 12/29/15  2335 History  By signing my name below, I, [facility], attest that this documentation has been prepared under the direction and in the presence of Gilda Crease, MD. Electronically Signed: Randell Patient, ED Scribe. 12/30/2015. 1:36 AM.  Chief Complaint  Patient presents with  . Migraine   The history is provided by the patient. No language interpreter was used.   HPI Comments: Michelle Rhodes is a 35 y.o. female who presents to the Emergency Department complaining of constant, moderate left-sided HA onset earlier today. She reports photophobia, nausea, vomiting. She notes similar HAs in the past. She has seen a neurologist for similar symptoms in the past. Denies any other symptoms currently.  Past Medical History  Diagnosis Date  . Migraine   . Asthma     as a child  . Anxiety    Past Surgical History  Procedure Laterality Date  . Laparoscopic bilateral salpingo oopherectomy Bilateral 01/06/2015    Procedure: LAPAROSCOPIC BILATERAL TUBAL LIGATION with FILSIE CLIPS ;  Surgeon: [place] Bing, MD;  Location: [facility] OR[facility];  Service: Gynecology;  Laterality: Bilateral;  . Endometrial ablation N/A 01/06/2015    Procedure: ENDOMETRIAL ABLATION;  Surgeon: [place] Bing, MD;  Location: [facility] OR[facility];  Service: Gynecology;  Laterality: N/A;   Family History  Problem Relation Age of Onset  . Hypertension Mother   . Stroke Mother   . Hypertension Father   . Migraines Neg Hx    Social History  Substance Use Topics  . Smoking status: Current Every Day Smoker -- 0.25 packs/day for 10 years    Types: Cigarettes  . Smokeless tobacco: Never Used  . Alcohol Use: No   OB History    Gravida Para Term Preterm AB TAB SAB Ectopic Multiple Living   7 5 1  1  0 1   1     Review of Systems  Eyes: Positive for photophobia.  Gastrointestinal: Positive for nausea and vomiting.  Neurological: Positive for headaches.  All other  systems reviewed and are negative.   Allergies  Review of patient's allergies indicates no known allergies.  Home Medications   Prior to Admission medications   Medication Sig Start Date End Date Taking? Authorizing Provider  acetaminophen (TYLENOL) 500 MG tablet Take 1,000 mg by mouth every 6 (six) hours as needed for headache. pain   Yes Historical Provider, MD  docusate sodium (COLACE) 100 MG capsule Take 1 capsule (100 mg total) by mouth 2 (two) times daily. 01/06/15  Yes [place] Bing, MD  HYDROcodone-acetaminophen (NORCO/VICODIN) 5-325 MG tablet Take 1 tablet by mouth every 6 (six) hours as needed for moderate pain.   Yes Historical Provider, MD  SUMAtriptan (IMITREX) 100 MG tablet Take 100 mg by mouth once. May repeat in 2 hours if headache persists or recurs.   Yes Historical Provider, MD   BP 93/65 mmHg  Pulse 62  Temp(Src) 98.6 F (37 C) (Oral)  Resp 18  SpO2 97% Physical Exam  Constitutional: She is oriented to person, place, and time. She appears well-developed and well-nourished. No distress.  HENT:  Head: Normocephalic and atraumatic.  Right Ear: Hearing normal.  Left Ear: Hearing normal.  Nose: Nose normal.  Mouth/Throat: Oropharynx is clear and moist and mucous membranes are normal.  Eyes: Conjunctivae and EOM are normal. Pupils are equal, round, and reactive to light.  Photophobia.  Neck: Normal range of motion. Neck supple.  Cardiovascular: Regular rhythm, S1 normal and S2  normal.  Exam reveals no gallop and no friction rub.   No murmur heard. Pulmonary/Chest: Effort normal and breath sounds normal. No respiratory distress. She exhibits no tenderness.  Abdominal: Soft. Normal appearance and bowel sounds are normal. There is no hepatosplenomegaly. There is no tenderness. There is no rebound, no guarding, no tenderness at McBurney's point and negative Murphy's sign. No hernia.  Musculoskeletal: Normal range of motion.  Neurological: She is alert and oriented to  person, place, and time. She has normal strength. No cranial nerve deficit or sensory deficit. Coordination normal. GCS eye subscore is 4. GCS verbal subscore is 5. GCS motor subscore is 6.  Skin: Skin is warm, dry and intact. No rash noted. No cyanosis.  Psychiatric: She has a normal mood and affect. Her speech is normal and behavior is normal. Thought content normal.  Nursing note and vitals reviewed.   ED Course  Procedures   DIAGNOSTIC STUDIES: Oxygen Saturation is 100% on RA, normal by my interpretation.    COORDINATION OF CARE: 12:47 AM Will order IV fluids, Compazine, Toradol, and Benadryl. Discussed treatment plan with pt at bedside and pt agreed to plan.   Labs Review Labs Reviewed - No data to display  Imaging Review No results found. I have personally reviewed and evaluated these images and lab results as part of my medical decision-making.   EKG Interpretation None      MDM   Final diagnoses:  Other type of migraine    Patient presents to the [facility] with complaints of migraine headache. Patient complaining of throbbing left-sided headache that is consistent with previous migraines. No unusual features compared to her previous migraines. Neurologic evaluation is unremarkable. Patient improved with migraine treatment.  I personally performed the services described in this documentation, which was scribed in my presence. The recorded information has been reviewed and is accurate.     Gilda Crease, MD 12/30/15 [phone]

## 2015-12-30 NOTE — Discharge Instructions (Signed)

## 2015-12-30 NOTE — ED Notes (Signed)
Pt complains of a migraine, has been seen recently for the same and at her doctor also, imitrex isn't working, was given an injection and was ok for a few days

## 2016-01-03 ENCOUNTER — Emergency Department (HOSPITAL_COMMUNITY)
Admission: EM | Admit: 2016-01-03 | Discharge: 2016-01-03 | Disposition: A | Discharge status: Home / Self Care | Payer: Medicaid Other | Attending: Emergency Medicine | Admitting: Emergency Medicine

## 2016-01-03 ENCOUNTER — Encounter (HOSPITAL_COMMUNITY): Payer: Self-pay | Admitting: *Deleted

## 2016-01-03 DIAGNOSIS — G43111 Migraine with aura, intractable, with status migrainosus: Secondary | ICD-10-CM | POA: Diagnosis not present

## 2016-01-03 DIAGNOSIS — R51 Headache: Secondary | ICD-10-CM | POA: Diagnosis present

## 2016-01-03 DIAGNOSIS — J45909 Unspecified asthma, uncomplicated: Secondary | ICD-10-CM | POA: Diagnosis not present

## 2016-01-03 DIAGNOSIS — Z8659 Personal history of other mental and behavioral disorders: Secondary | ICD-10-CM | POA: Insufficient documentation

## 2016-01-03 DIAGNOSIS — G43011 Migraine without aura, intractable, with status migrainosus: Secondary | ICD-10-CM | POA: Insufficient documentation

## 2016-01-03 DIAGNOSIS — F17210 Nicotine dependence, cigarettes, uncomplicated: Secondary | ICD-10-CM | POA: Insufficient documentation

## 2016-01-03 MED ORDER — GABAPENTIN 100 MG PO CAPS: 300.0000 mg | ORAL_CAPSULE | Freq: Three times a day (TID) | ORAL | Status: DC

## 2016-01-03 MED ORDER — METOCLOPRAMIDE HCL 5 MG/ML IJ SOLN
10.0000 mg | Freq: Once | INTRAMUSCULAR | Status: AC
  Administered 2016-01-03: 10 mg via INTRAVENOUS
  Filled 2016-01-03: qty 2

## 2016-01-03 MED ORDER — VALPROATE SODIUM 500 MG/5ML IV SOLN
500.0000 mg | Freq: Once | INTRAVENOUS | Status: AC
  Administered 2016-01-03: 500 mg via INTRAVENOUS
  Filled 2016-01-03: qty 5

## 2016-01-03 MED ORDER — DEXAMETHASONE SODIUM PHOSPHATE 10 MG/ML IJ SOLN
10.0000 mg | Freq: Once | INTRAMUSCULAR | Status: AC
  Administered 2016-01-03: 10 mg via INTRAVENOUS
  Filled 2016-01-03: qty 1

## 2016-01-03 MED ORDER — KETOROLAC TROMETHAMINE 15 MG/ML IJ SOLN
15.0000 mg | Freq: Once | INTRAMUSCULAR | Status: AC
  Administered 2016-01-03: 15 mg via INTRAVENOUS
  Filled 2016-01-03: qty 1

## 2016-01-03 MED ORDER — MAGNESIUM SULFATE 2 GM/50ML IV SOLN
2.0000 g | Freq: Once | INTRAVENOUS | Status: AC
  Administered 2016-01-03: 2 g via INTRAVENOUS
  Filled 2016-01-03: qty 50

## 2016-01-03 MED ORDER — DIPHENHYDRAMINE HCL 50 MG/ML IJ SOLN
25.0000 mg | Freq: Once | INTRAMUSCULAR | Status: AC
  Administered 2016-01-03: 25 mg via INTRAVENOUS
  Filled 2016-01-03: qty 1

## 2016-01-03 MED ORDER — SODIUM CHLORIDE 0.9 % IV BOLUS (SEPSIS)
1000.0000 mL | Freq: Once | INTRAVENOUS | Status: AC
Start: 2016-01-03 — End: 2016-01-03
  Administered 2016-01-03: 1000 mL via INTRAVENOUS

## 2016-01-03 MED ORDER — SODIUM CHLORIDE 0.9 % IV BOLUS (SEPSIS)
1000.0000 mL | Freq: Once | INTRAVENOUS | Status: AC
  Administered 2016-01-03: 1000 mL via INTRAVENOUS

## 2016-01-03 NOTE — Consult Note (Signed)
NEURO HOSPITALIST CONSULT NOTE   Requestig physician: Dr. Rhunette Croft   Reason for Consult: HA   History obtained from:  Patient    HPI:                                                                                                                                          Michelle Rhodes is an 35 y.o. female with known migraine HA and is cared for by Dr Lucia Gaskins [facility] fr her HA. She awake this AM at 0200 hours with left periorbital HA with N/V.  She came to ED and received Decadron, Benadryl, Toradol, Reglan.  Her HA has improved and now she is at a 3/10. She desires to be discharged and does  Not want to be admitted.  Past Medical History  Diagnosis Date  . Migraine   . Asthma     as a child  . Anxiety     Past Surgical History  Procedure Laterality Date  . Laparoscopic bilateral salpingo oopherectomy Bilateral 01/06/2015    Procedure: LAPAROSCOPIC BILATERAL TUBAL LIGATION with FILSIE CLIPS ;  Surgeon: [place] Bing, MD;  Location: [facility] OR[facility];  Service: Gynecology;  Laterality: Bilateral;  . Endometrial ablation N/A 01/06/2015    Procedure: ENDOMETRIAL ABLATION;  Surgeon: [place] Bing, MD;  Location: [facility] OR[facility];  Service: Gynecology;  Laterality: N/A;    Family History  Problem Relation Age of Onset  . Hypertension Mother   . Stroke Mother   . Hypertension Father   . Migraines Neg Hx      Social History:  reports that she has been smoking Cigarettes.  She has a 2.5 pack-year smoking history. She has never used smokeless tobacco. She reports that she does not drink alcohol or use illicit drugs.  No Known Allergies  MEDICATIONS:                                                                                                                     No current facility-administered medications for this encounter.   Current Outpatient Prescriptions  Medication Sig Dispense Refill  . HYDROcodone-acetaminophen (NORCO/VICODIN) 5-325 MG tablet Take 1 tablet by mouth  every 6 (six) hours as needed for moderate pain.    . SUMAtriptan (IMITREX) 100 MG tablet Take 100 mg by mouth  once. May repeat in 2 hours if headache persists or recurs.    . docusate sodium (COLACE) 100 MG capsule Take 1 capsule (100 mg total) by mouth 2 (two) times daily. (Patient not taking: Reported on 01/03/2016) 30 capsule 1     ROS:                                                                                                                                       History obtained from the patient  General ROS: negative for - chills, fatigue, fever, night sweats, weight gain or weight loss Psychological ROS: negative for - behavioral disorder, hallucinations, memory difficulties, mood swings or suicidal ideation Ophthalmic ROS: negative for - blurry vision, double vision, eye pain or loss of vision ENT ROS: negative for - epistaxis, nasal discharge, oral lesions, sore throat, tinnitus or vertigo Allergy and Immunology ROS: negative for - hives or itchy/watery eyes Hematological and Lymphatic ROS: negative for - bleeding problems, bruising or swollen lymph nodes Endocrine ROS: negative for - galactorrhea, hair pattern changes, polydipsia/polyuria or temperature intolerance Respiratory ROS: negative for - cough, hemoptysis, shortness of breath or wheezing Cardiovascular ROS: negative for - chest pain, dyspnea on exertion, edema or irregular heartbeat Gastrointestinal ROS: negative for - abdominal pain, diarrhea, hematemesis, nausea/vomiting or stool incontinence Genito-Urinary ROS: negative for - dysuria, hematuria, incontinence or urinary frequency/urgency Musculoskeletal ROS: negative for - joint swelling or muscular weakness Neurological ROS: as noted in HPI Dermatological ROS: negative for rash and skin lesion changes   Blood pressure 103/61, pulse 68, temperature 98.1 F (36.7 C), temperature source Oral, resp. rate 22, weight 58.968 kg (130 lb), SpO2 97 %.   Neurologic  Examination:                                                                                                      HEENT-  Normocephalic, no lesions, without obvious abnormality.  Normal external eye and conjunctiva.  Normal TM's bilaterally.  Normal auditory canals and external ears. Normal external nose, mucus membranes and septum.  Normal pharynx. Cardiovascular- S1, S2 normal, pulses palpable throughout   Lungs- chest clear, no wheezing, rales, normal symmetric air entry Abdomen- normal findings: bowel sounds normal Extremities- no edema Lymph-no adenopathy palpable Musculoskeletal-no joint tenderness, deformity or swelling Skin-warm and dry, no hyperpigmentation, vitiligo, or suspicious lesions  Neurological Examination Mental Status: Alert, oriented, thought content appropriate.  Speech fluent without evidence of aphasia.  Able to follow 3 step commands without difficulty. Cranial  Nerves: II: \ Visual fields grossly normal, pupils equal, round, reactive to light and accommodation III,IV, VI: ptosis not present, extra-ocular motions intact bilaterally V,VII: smile symmetric, facial light touch sensation normal bilaterally VIII: hearing normal bilaterally IX,X: uvula rises symmetrically XI: bilateral shoulder shrug XII: midline tongue extension Motor: Right : Upper extremity   5/5    Left:     Upper extremity   5/5  Lower extremity   5/5     Lower extremity   5/5 Tone and bulk:normal tone throughout; no atrophy noted Sensory: Pinprick and light touch intact throughout, bilaterally Deep Tendon Reflexes: 2+ and symmetric throughout Plantars: Right: downgoing   Left: downgoing Cerebellar: normal finger-to-nose, normal rapid alternating movements and normal heel-to-shin test Gait: normal gait and station      Lab Results: Basic Metabolic Panel: No results for input(s): NA, K, CL, CO2, GLUCOSE, BUN, CREATININE, CALCIUM, MG, PHOS in the last 168 hours.  Liver Function Tests: No  results for input(s): AST, ALT, ALKPHOS, BILITOT, PROT, ALBUMIN in the last 168 hours. No results for input(s): LIPASE, AMYLASE in the last 168 hours. No results for input(s): AMMONIA in the last 168 hours.  CBC: No results for input(s): WBC, NEUTROABS, HGB, HCT, MCV, PLT in the last 168 hours.  Cardiac Enzymes: No results for input(s): CKTOTAL, CKMB, CKMBINDEX, TROPONINI in the last 168 hours.  Lipid Panel: No results for input(s): CHOL, TRIG, HDL, CHOLHDL, VLDL, LDLCALC in the last 168 hours.  CBG: No results for input(s): GLUCAP in the last 168 hours.  Microbiology: Results for orders placed or performed during the hospital encounter of 12/20/08  Wet prep, genital     Status: Abnormal   Collection Time: 12/20/08 12:47 PM  Result Value Ref Range Status   Yeast Wet Prep HPF POC NONE SEEN NONE SEEN Final   Trich, Wet Prep NONE SEEN NONE SEEN Final   Clue Cells Wet Prep HPF POC FEW (A) NONE SEEN Final   WBC, Wet Prep HPF POC FEW (A) NONE SEEN Final  GC/chlamydia probe amp, genital     Status: None   Collection Time: 12/20/08 12:47 PM  Result Value Ref Range Status   GC Probe Amp, Genital  NEGATIVE Final    NEGATIVE (NOTE)  Testing performed using the BD Probetec ET Chlamydia trachomatis and Neisseria gonorrhea amplified DNA assay.   Chlamydia, DNA Probe  NEGATIVE Final    NEGATIVE (NOTE)  Testing performed using the BD Probetec ET Chlamydia trachomatis and Neisseria gonorrhea amplified DNA assay.    Coagulation Studies: No results for input(s): LABPROT, INR in the last 72 hours.  Imaging: No results found.     Assessment and plan per attending neurologist  Felicie Morn PA-C [facility] [phone]  01/03/2016, 8:58 AM   Assessment/Plan:  35 year old female with intractable migraine headache.   1) we'll give additional migraine cocktail including depakote, magnesium 2) patient was offered admission, but declines 3) gabapentin  tID x 3 - 5 days  I have found to occasionaly be helpful 4) patient can follow up as outpatient with Dr. Lucia Gaskins.   Ritta Slot, MD [facility] [phone]  If 7pm- 7am, please page neurology on call as listed in AMION.

## 2016-01-03 NOTE — ED Notes (Addendum)
The pt is c/o a headache  For one week with n and vomiting.  She has taken imitrex and vicodin.Marland Kitchen lmp none

## 2016-01-03 NOTE — ED Notes (Signed)
Dr. Kirkpatrick MD at bedside. 

## 2016-01-03 NOTE — Discharge Instructions (Signed)

## 2016-01-03 NOTE — ED Provider Notes (Addendum)
Per consultation with Dr. Amada Jupiter, the patient will get 2 g of IV magnesium and an IV dose of Depakote in the emergency department. The patient will be discharged with a prescription for gabapentin 300 mg 3 times a day for 5 days with follow-up with her neurologist.  Arby Barrette, MD 01/03/16 1055  Arby Barrette, MD 01/03/16 1056

## 2016-01-03 NOTE — ED Notes (Signed)
Neuro PA at bedside.  

## 2016-01-03 NOTE — ED Notes (Signed)
Pt states she doesn't want the blood pressure cuff on because it squeezes her arm

## 2016-01-03 NOTE — ED Provider Notes (Addendum)
CSN: [id number]     Arrival date & time 01/03/16  0243 History   First MD Initiated Contact with Patient 01/03/16 0547     Chief Complaint  Patient presents with  . Headache     (Consider location/radiation/quality/duration/timing/severity/associated sxs/prior Treatment) HPI Comments: SUBJECTIVE:  Michelle Rhodes is a 35 y.o. female who complains of migraine headache for 3 week(s). She has a well established history of recurrent migraines.  Description of pain: throbbing pain, sharp pain. Associated symptoms: light sensitivity, nausea and visual disturbance. Patient has already taken imitrex for this headache without relief.  No seizures, altered mental status, loss of consciousness, new gait instability, neck pain or stiffness.   No current facility-administered medications for this encounter. Current Outpatient Prescriptions: HYDROcodone-acetaminophen (NORCO/VICODIN) 5-325 MG tablet, Take 1 tablet by mouth every 6 (six) hours as needed for moderate pain., Disp: , Rfl:  SUMAtriptan (IMITREX) 100 MG tablet, Take 100 mg by mouth once. May repeat in 2 hours if headache persists or recurs., Disp: , Rfl:  docusate sodium (COLACE) 100 MG capsule, Take 1 capsule (100 mg total) by mouth 2 (two) times daily. (Patient not taking: Reported on 01/03/2016), Disp: 30 capsule, Rfl: 1    Past neurological history: negative for stroke, MS, epilepsy, or brain tumor.   OBJECTIVE:  Patient appears in pain, preferring to lie in a darkened room. Her vitals are normal. Alert and oriented x 3.  Ears and throat normal. Neck fully supple without nodes. Sinuses non tender. Cranial nerves are normal.  Fundi are normal with sharp disc margins, no papilledema, hemorrhages or exudates noted. DTR's normal and symmetric. Babinski sign absent.  Mental status normal. Cerebellar function normal.     The history is provided by the patient.    Past Medical History  Diagnosis Date  . Migraine   . Asthma     as a  child  . Anxiety    Past Surgical History  Procedure Laterality Date  . Laparoscopic bilateral salpingo oopherectomy Bilateral 01/06/2015    Procedure: LAPAROSCOPIC BILATERAL TUBAL LIGATION with FILSIE CLIPS ;  Surgeon: Wellford Bing, MD;  Location: [facility];  Service: Gynecology;  Laterality: Bilateral;  . Endometrial ablation N/A 01/06/2015    Procedure: ENDOMETRIAL ABLATION;  Surgeon: [place] Bing, MD;  Location: [facility];  Service: Gynecology;  Laterality: N/A;   Family History  Problem Relation Age of Onset  . Hypertension Mother   . Stroke Mother   . Hypertension Father   . Migraines Neg Hx    Social History  Substance Use Topics  . Smoking status: Current Every Day Smoker -- 0.25 packs/day for 10 years    Types: Cigarettes  . Smokeless tobacco: Never Used  . Alcohol Use: No   OB History    Gravida Para Term Preterm AB TAB SAB Ectopic Multiple Living   7 5 1  1  0 1   1     Review of Systems  All other systems reviewed and are negative.     Allergies  Review of patient's allergies indicates no known allergies.  Home Medications   Prior to Admission medications   Medication Sig Start Date End Date Taking? Authorizing Provider  HYDROcodone-acetaminophen (NORCO/VICODIN) 5-325 MG tablet Take 1 tablet by mouth every 6 (six) hours as needed for moderate pain.   Yes Historical Provider, MD  SUMAtriptan (IMITREX) 100 MG tablet Take 100 mg by mouth once. May repeat in 2 hours if headache persists or recurs.   Yes Historical Provider, MD  docusate sodium (COLACE) 100 MG capsule Take 1 capsule (100 mg total) by mouth 2 (two) times daily. Patient not taking: Reported on 01/03/2016 01/06/15   [place] Bing, MD   BP 103/61 mmHg  Pulse 68  Temp(Src) 98.1 F (36.7 C) (Oral)  Resp 22  Wt 130 lb (58.968 kg)  SpO2 97% Physical Exam  Constitutional: She is oriented to person, place, and time. She appears well-developed.  HENT:  Head: Normocephalic and atraumatic.  EOMI,  PERRL  Eyes: EOM are normal.  Neck: Normal range of motion. Neck supple.  Cardiovascular: Normal rate.   Pulmonary/Chest: Effort normal.  Abdominal: Bowel sounds are normal.  Neurological: She is alert and oriented to person, place, and time. No cranial nerve deficit. Coordination normal.  Skin: Skin is warm and dry.  Nursing note and vitals reviewed.   ED Course  Procedures (including critical care time) Labs Review Labs Reviewed - No data to display  Imaging Review No results found. I have personally reviewed and evaluated these images and lab results as part of my medical decision-making.   EKG Interpretation None      MDM   Final diagnoses:  Intractable migraine without aura and with status migrainosus    ASSESSMENT:  Migraine headache  PLAN:  Control headaches. Consult Neuro if pain is intractable for optimal care.  Clinically - no concerns for infection, trauma, bleed.  7:48 AM Upon reassessment, patient reports that the headache has improved from 10/10 to 4/10. She is a bit concerned that her symptoms might get worse once she is discharged. Pt has Neuro f/u in June. We will consult neuro for optimization, anticipate d/c.    Derwood Kaplan, MD 01/03/16 1610  Derwood Kaplan, MD 01/03/16 [phone]

## 2016-01-03 NOTE — ED Notes (Signed)
Dr. Nanavati MD at bedside. 

## 2016-01-05 ENCOUNTER — Encounter (HOSPITAL_COMMUNITY): Payer: Self-pay | Admitting: Emergency Medicine

## 2016-01-05 ENCOUNTER — Emergency Department (HOSPITAL_COMMUNITY)
Admission: EM | Admit: 2016-01-05 | Discharge: 2016-01-05 | Disposition: A | Discharge status: Home / Self Care | Payer: Medicaid Other | Attending: Emergency Medicine | Admitting: Emergency Medicine

## 2016-01-05 DIAGNOSIS — G43409 Hemiplegic migraine, not intractable, without status migrainosus: Secondary | ICD-10-CM

## 2016-01-05 DIAGNOSIS — F17210 Nicotine dependence, cigarettes, uncomplicated: Secondary | ICD-10-CM | POA: Insufficient documentation

## 2016-01-05 DIAGNOSIS — R202 Paresthesia of skin: Secondary | ICD-10-CM | POA: Insufficient documentation

## 2016-01-05 DIAGNOSIS — Z79899 Other long term (current) drug therapy: Secondary | ICD-10-CM | POA: Insufficient documentation

## 2016-01-05 DIAGNOSIS — J45909 Unspecified asthma, uncomplicated: Secondary | ICD-10-CM | POA: Diagnosis not present

## 2016-01-05 DIAGNOSIS — G43909 Migraine, unspecified, not intractable, without status migrainosus: Secondary | ICD-10-CM | POA: Diagnosis present

## 2016-01-05 DIAGNOSIS — Z79891 Long term (current) use of opiate analgesic: Secondary | ICD-10-CM | POA: Insufficient documentation

## 2016-01-05 MED ORDER — METOCLOPRAMIDE HCL 5 MG/ML IJ SOLN
10.0000 mg | Freq: Once | INTRAMUSCULAR | Status: AC
  Administered 2016-01-05: 10 mg via INTRAVENOUS
  Filled 2016-01-05: qty 2

## 2016-01-05 MED ORDER — DEXAMETHASONE SODIUM PHOSPHATE 10 MG/ML IJ SOLN
10.0000 mg | Freq: Once | INTRAMUSCULAR | Status: AC
  Administered 2016-01-05: 10 mg via INTRAVENOUS
  Filled 2016-01-05: qty 1

## 2016-01-05 MED ORDER — KETOROLAC TROMETHAMINE 30 MG/ML IJ SOLN
30.0000 mg | Freq: Once | INTRAMUSCULAR | Status: AC
  Administered 2016-01-05: 30 mg via INTRAVENOUS
  Filled 2016-01-05: qty 1

## 2016-01-05 MED ORDER — DIPHENHYDRAMINE HCL 50 MG/ML IJ SOLN
12.5000 mg | Freq: Once | INTRAMUSCULAR | Status: AC
  Administered 2016-01-05: 12.5 mg via INTRAVENOUS
  Filled 2016-01-05: qty 1

## 2016-01-05 MED ORDER — SODIUM CHLORIDE 0.9 % IV BOLUS (SEPSIS)
1000.0000 mL | Freq: Once | INTRAVENOUS | Status: AC
  Administered 2016-01-05: 1000 mL via INTRAVENOUS

## 2016-01-05 NOTE — ED Notes (Signed)
EDP at bedside  

## 2016-01-05 NOTE — ED Provider Notes (Signed)
CSN: [id number]     Arrival date & time 01/05/16  1191 History   First MD Initiated Contact with Patient 01/05/16 [phone]     Chief Complaint  Patient presents with  . Migraine     (Consider location/radiation/quality/duration/timing/severity/associated sxs/prior Treatment) HPI Comments: Patient with a history of complicated, chronic migraine presents with recurrent headache that started last night and is typical her headaches. She reports left sided numbness and weakness, including face and extremities, which is not typical of headache but has occurred in the past. She has tried taking her migraine medication at home without relief. No fever. She has had nausea with vomiting.   Patient is a 35 y.o. female presenting with migraines. The history is provided by the patient. No language interpreter was used.  Migraine This is a recurrent problem. The current episode started yesterday. The problem occurs constantly. The problem has been unchanged. Associated symptoms include headaches, nausea, numbness and weakness. Pertinent negatives include no chills, fever or vomiting.    Past Medical History  Diagnosis Date  . Migraine   . Asthma     as a child  . Anxiety    Past Surgical History  Procedure Laterality Date  . Laparoscopic bilateral salpingo oopherectomy Bilateral 01/06/2015    Procedure: LAPAROSCOPIC BILATERAL TUBAL LIGATION with FILSIE CLIPS ;  Surgeon: [place] Bing, MD;  Location: [facility];  Service: Gynecology;  Laterality: Bilateral;  . Endometrial ablation N/A 01/06/2015    Procedure: ENDOMETRIAL ABLATION;  Surgeon: [place] Bing, MD;  Location: [facility];  Service: Gynecology;  Laterality: N/A;   Family History  Problem Relation Age of Onset  . Hypertension Mother   . Stroke Mother   . Hypertension Father   . Migraines Neg Hx    Social History  Substance Use Topics  . Smoking status: Current Every Day Smoker -- 0.25 packs/day for 10 years    Types: Cigarettes  .  Smokeless tobacco: Never Used  . Alcohol Use: No   OB History    Gravida Para Term Preterm AB TAB SAB Ectopic Multiple Living   7 5 1  1  0 1   1     Review of Systems  Constitutional: Negative for fever and chills.  Eyes: Positive for photophobia.  Respiratory: Negative.  Negative for shortness of breath.   Cardiovascular: Negative.   Gastrointestinal: Positive for nausea. Negative for vomiting.  Musculoskeletal: Negative.   Skin: Negative.   Neurological: Positive for weakness, numbness and headaches. Negative for syncope and speech difficulty.      Allergies  Review of patient's allergies indicates no known allergies.  Home Medications   Prior to Admission medications   Medication Sig Start Date End Date Taking? Authorizing Provider  docusate sodium (COLACE) 100 MG capsule Take 1 capsule (100 mg total) by mouth 2 (two) times daily. Patient not taking: Reported on 01/03/2016 01/06/15   [place] Bing, MD  gabapentin (NEURONTIN) 100 MG capsule Take 3 capsules (300 mg total) by mouth 3 (three) times daily. 01/03/16 01/07/16  Arby Barrette, MD  HYDROcodone-acetaminophen (NORCO/VICODIN) 5-325 MG tablet Take 1 tablet by mouth every 6 (six) hours as needed for moderate pain.    Historical Provider, MD  SUMAtriptan (IMITREX) 100 MG tablet Take 100 mg by mouth once. May repeat in 2 hours if headache persists or recurs.    Historical Provider, MD   BP 110/83 mmHg  Pulse 62  Temp(Src) 98.6 F (37 C) (Oral)  Resp 17  SpO2 97% Physical Exam  Constitutional: She  is oriented to person, place, and time. She appears well-developed and well-nourished.  HENT:  Head: Normocephalic.  Eyes: Pupils are equal, round, and reactive to light.  Neck: Normal range of motion. Neck supple.  Cardiovascular: Normal rate and regular rhythm.   Pulmonary/Chest: Effort normal and breath sounds normal.  Abdominal: Soft. Bowel sounds are normal. There is no tenderness. There is no rebound and no guarding.   Musculoskeletal: Normal range of motion.  Neurological: She is alert and oriented to person, place, and time. She has normal strength and normal reflexes. No sensory deficit. She displays a negative Romberg sign. Coordination normal.  Cranial nerves 3-12 grossly intact. Slight weakness of left grip and left plantar/dorsiflexion of left LE. Follows commands. Speech clear and focused. Cognition and memory unaffected.  Skin: Skin is warm and dry. No rash noted.  Psychiatric: She has a normal mood and affect.    ED Course  Procedures (including critical care time) Labs Review Labs Reviewed - No data to display  Imaging Review No results found. I have personally reviewed and evaluated these images and lab results as part of my medical decision-making.   EKG Interpretation None      MDM   Final diagnoses:  None    1. Migraine 2. Paresthesia  The patient presents with migraine headache typical of previous headaches, with left sided paresthesias and weakness which she has had previously but that does not accompany every headache. Chart reviewed. MRI in 2011 in evaluation of similar presentation to today.  Reassessment: The patient is given a headache cocktail of Decadron, Toradol, Reglan and Benadryl. She reports the headache is much better. No further lateralizing weakness on grip strength. Normal sensation to light touch that is equal bilaterally. All symptoms resolving well. VSS. She is felt stable for discharge home with diagnosis of complicated migraine.  Elpidio Anis, PA-C 01/05/16 1610  Tomasita Crumble, MD 01/05/16 1536

## 2016-01-05 NOTE — Discharge Instructions (Signed)
Recurrent Migraine Headache °A migraine headache is very bad, throbbing pain on one or both sides of your head. Recurrent migraines keep coming back. Talk to your doctor about what things may bring on (trigger) your migraine headaches. °HOME CARE °· Only take medicines as told by your doctor. °· Lie down in a dark, quiet room when you have a migraine. °· Keep a journal to find out if certain things bring on migraine headaches. For example, write down: °¨ What you eat and drink. °¨ How much sleep you get. °¨ Any change to your diet or medicines. °· Lessen how much alcohol you drink. °· Quit smoking if you smoke. °· Get enough sleep. °· Lessen any stress in your life. °· Keep lights dim if bright lights bother you or make your migraines worse. °GET HELP IF: °· Medicine does not help your migraines. °· Your pain keeps coming back. °· You have a fever. °GET HELP RIGHT AWAY IF:  °· Your migraine becomes really bad. °· You have a stiff neck. °· You have trouble seeing. °· Your muscles are weak, or you lose muscle control. °· You lose your balance or have trouble walking. °· You feel like you will pass out (faint), or you pass out. °· You have really bad symptoms that are different than your first symptoms. °MAKE SURE YOU:  °· Understand these instructions. °· Will watch your condition. °· Will get help right away if you are not doing well or get worse. °  °This information is not intended to replace advice given to you by your health care provider. Make sure you discuss any questions you have with your health care provider. °  °Document Released: 05/08/2008 Document Revised: 08/04/2013 Document Reviewed: 04/06/2013 °Elsevier Interactive Patient Education ©2016 [employer] ° °

## 2016-01-05 NOTE — ED Notes (Signed)
EDP aware of neuro S/S. Pt in reporting migraine since last night with L sided numbness/weakness. Pt nauseous, light sensitivity.

## 2016-01-12 ENCOUNTER — Encounter: Payer: Self-pay | Admitting: Neurology

## 2016-01-12 ENCOUNTER — Ambulatory Visit (INDEPENDENT_AMBULATORY_CARE_PROVIDER_SITE_OTHER): Payer: Medicaid Other | Admitting: Neurology

## 2016-01-12 VITALS — BP 105/72 | HR 71 | Ht 64.0 in | Wt 132.4 lb

## 2016-01-12 DIAGNOSIS — R930 Abnormal findings on diagnostic imaging of skull and head, not elsewhere classified: Secondary | ICD-10-CM

## 2016-01-12 DIAGNOSIS — H539 Unspecified visual disturbance: Secondary | ICD-10-CM

## 2016-01-12 DIAGNOSIS — R519 Headache, unspecified: Secondary | ICD-10-CM

## 2016-01-12 DIAGNOSIS — R51 Headache: Secondary | ICD-10-CM | POA: Diagnosis not present

## 2016-01-12 DIAGNOSIS — R9082 White matter disease, unspecified: Secondary | ICD-10-CM

## 2016-01-12 MED ORDER — METHYLPREDNISOLONE 4 MG PO TBPK: ORAL_TABLET | ORAL | Status: DC

## 2016-01-12 MED ORDER — TOPIRAMATE 25 MG PO TABS: ORAL_TABLET | ORAL | Status: DC

## 2016-01-12 MED ORDER — ONDANSETRON 4 MG PO TBDP: 4.0000 mg | ORAL_TABLET | Freq: Three times a day (TID) | ORAL | Status: DC | PRN

## 2016-01-12 MED ORDER — KETOROLAC TROMETHAMINE 60 MG/2ML IM SOLN
60.0000 mg | Freq: Once | INTRAMUSCULAR | Status: AC
  Administered 2016-01-12: 60 mg via INTRAMUSCULAR

## 2016-01-12 MED ORDER — TOPIRAMATE 100 MG PO TABS: 100.0000 mg | ORAL_TABLET | Freq: Every day | ORAL | Status: DC

## 2016-01-12 MED ORDER — ZOLMITRIPTAN 5 MG NA SOLN: 1.0000 | NASAL | Status: DC | PRN

## 2016-01-12 NOTE — Progress Notes (Signed)
[facility]   Provider: Dr Lucia Gaskins Referring Provider: Harlene Ramus, [place] Primary Care Physician: [place], [place]  CC: Neck pain and numbness, migraines.  Interval history 01/12/2016: Haven't seen her in a year. She has been to the ED multiple times. She has left-sided weakness and numbness with the headaches. Given cocktail of Decadron, Toradol, Reglan and Benadryl with improvement. Imitrex not working, will change to Zomig nasal spray.  I ordered an MRi of the brain and cervical spine last year to follow abnormal white matter changes on MRI, she did not have it completed and I reminded her today she will have the brain done. She has had the migraine for 4 weeks now. It is every year around time. For 30 days she has had a migraine, she has had 2 good days. She wakes up with headaches. The hedaches are worsening with vision changes. She has multiple a week, can be severe, has had the last headache for 4 weeks.   She has weekly headaches 2-3 a week. She takes tylenol a lot. She has tension around the eye. Migraines ar eon the left. Lights bother her, make her feel dizzy. She also has bad migraines 1-2x a year but they can be severe and last 30 days. She has neck pain, tightness.  HPI: MARVEL SAPP is a 35 y.o. female here as a referral from Dr. Harlene Ramus for neck pain and numbness, migraines.  Migraines started at the age of 13. Migraines are worsening, always on the left side side, pulsating and throbbing, light sensitivity, sound sensitivity, 10/10 pain, they last all day and have lasted 28 days. She has to go into a dark room. She gets neck pain on the left side. She has tingling in the left hand which radiates up the arm. Migraines are once a year and can last a month. Last migraine 2 weeks ago for about 2 days, last extended migraine last July. Takes imitrex which doesn't work. She has been to the emergency room in the past but otherwise just sits in her room  for 30 days. No aura.   Left arm: She has tingling of the left fingers. Happens sporadically. She wakes up with numbness in the hand all the time. No grip weakness. She has tightness in the neck but no radicular symptoms.   Reviewed notes, labs and imaging from outside physicians, which showed:   IMPRESSIO MRI of the brain (personally reviewed images) : 1. Stable nonspecific white matter signal changes without enhancement in the anterior frontal lobe. Top differential considerations remain sequelae of migraines, demyelination, and trauma. See comparison for additional diagnostic considerations. 2. 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 Recommend 1 year follow up to document stability.  Review of Systems: Patient complains of symptoms per HPI as well as the following symptoms: Headache, numbness, weakness, passing out, anxiety, decreased energy. Pertinent negatives per HPI. All others negative.  Social History   Social History  . Marital Status: Single    Spouse Name: N/A  . Number of Children: 1  . Years of Education: 12   Occupational History  . Not on file.   Social History Main Topics  . Smoking status: Current Every Day Smoker -- 0.25 packs/day for 10 years    Types: Cigarettes  . Smokeless tobacco: Never Used  . Alcohol Use: No  . Drug Use: No  . Sexual Activity: Yes   Other Topics Concern  . Not on file   Social History Narrative  Lives at home with son.   Caffeine use: Drinks diet Mt. Dew soda (4 cans per day)       Family History  Problem Relation Age of Onset  . Hypertension Mother   . Stroke Mother   . Hypertension Father   . Migraines Neg Hx     Past Medical History  Diagnosis Date  . Migraine   . Asthma     as a child  . Anxiety     Past Surgical History  Procedure Laterality Date  . Laparoscopic bilateral salpingo oopherectomy Bilateral 01/06/2015    Procedure: LAPAROSCOPIC BILATERAL TUBAL LIGATION with FILSIE CLIPS ;   Surgeon: [place] Bing, MD;  Location: [facility];  Service: Gynecology;  Laterality: Bilateral;  . Endometrial ablation N/A 01/06/2015    Procedure: ENDOMETRIAL ABLATION;  Surgeon: [place] Bing, MD;  Location: [facility];  Service: Gynecology;  Laterality: N/A;    Current Outpatient Prescriptions  Medication Sig Dispense Refill  . clonazePAM (KLONOPIN) 0.5 MG tablet Take 0.5 mg by mouth 2 (two) times daily as needed for anxiety.    . cyclobenzaprine (FLEXERIL) 10 MG tablet Take 10 mg by mouth 3 (three) times daily as needed for muscle spasms.    Marland Kitchen HYDROcodone-acetaminophen (NORCO/VICODIN) 5-325 MG tablet Take 1 tablet by mouth every 6 (six) hours as needed for moderate pain.    Marland Kitchen gabapentin (NEURONTIN) 100 MG capsule Take 3 capsules (300 mg total) by mouth 3 (three) times daily. 45 capsule 0  . methylPREDNISolone (MEDROL DOSEPAK) 4 MG TBPK tablet follow package directions 21 tablet 1  . ondansetron (ZOFRAN-ODT) 4 MG disintegrating tablet Take 1 tablet (4 mg total) by mouth every 8 (eight) hours as needed for nausea. Or migraine 30 tablet 12  . SUMAtriptan (IMITREX) 100 MG tablet Take 100 mg by mouth once. Reported on 01/12/2016    . topiramate (TOPAMAX) 100 MG tablet Take 1 tablet (100 mg total) by mouth at bedtime. 30 tablet 12  . zolmitriptan (ZOMIG) 5 MG nasal solution Place 1 spray into the nose as needed for migraine. 12 Units 11   Current Facility-Administered Medications  Medication Dose Route Frequency Provider Last Rate Last Dose  . ketorolac (TORADOL) injection 60 mg  60 mg Intramuscular Once Anson Fret, MD        Allergies as of 01/12/2016  . (No Known Allergies)    Vitals: BP 105/72 mmHg  Pulse 71  Ht 5\' 4"  (1.626 m)  Wt 132 lb 6.4 oz (60.056 kg)  BMI 22.72 kg/m2 Last Weight:  Wt Readings from Last 1 Encounters:  01/12/16 132 lb 6.4 oz (60.056 kg)   Last Height:   Ht Readings from Last 1 Encounters:  01/12/16 5\' 4"  (1.626 m)    Physical exam: Exam: Gen: NAD,  conversant, well nourised, well groomed  CV: RRR, no MRG. No Carotid Bruits. No peripheral edema, warm, nontender Eyes: Conjunctivae clear without exudates or hemorrhage  Neuro: Detailed Neurologic Exam  Speech:  Speech is normal; fluent and spontaneous with normal comprehension.  Cognition:  The patient is oriented to person, place, and time;   recent and remote memory intact;   language fluent;   normal attention, concentration,   fund of knowledge Cranial Nerves:  The pupils are equal, round, and reactive to light. The fundi are normal and spontaneous venous pulsations are present. Visual fields are full to finger confrontation. Extraocular movements are intact. Trigeminal sensation is intact and the muscles of mastication are normal. The face is symmetric. The  palate elevates in the midline. Hearing intact. Voice is normal. Shoulder shrug is normal. The tongue has normal motion without fasciculations.   Coordination:  Normal finger to nose and heel to shin. Normal rapid alternating movements.   Gait:  Heel-toe and tandem gait are normal.   Motor Observation:  No asymmetry, no atrophy, and no involuntary movements noted. Tone:  Normal muscle tone.   Posture:  Posture is normal. normal erect   Strength:  Strength is V/V in the upper and lower limbs.    Sensation: intact to LT   Reflex Exam:  DTR's:  Deep tendon reflexes in the upper and lower extremities are normal bilaterally.  Toes:  The toes are downgoing bilaterally.  Clonus:  Clonus is absent.   Assessment/Plan: 35 year old female with a past medical history of migraines, abnormal white matter changes on last MRI in 2011, left arm and hand pain and paresthesias.  Will repeat MRI of the brain to follow abnormal white matter changes seen in 2011. As far as your medications are concerned, I would like to suggest: Topiramate 25mg  at bedtime  for one week, then 50mg  for one week, then 100mg  at night before bed Zomig nasal spray at onset of migraine. . If it does not improve the symptoms please take one additional dose. Do not take more then 2 doses in 24hrs. Do not take use more then 2 to 3 times in a week. Medrol dosepak as prescribed  To prevent or relieve headaches, try the following: Cool Compress. Lie down and place a cool compress on your head.  Avoid headache triggers. If certain foods or odors seem to have triggered your migraines in the past, avoid them. A headache diary might help you identify triggers.  Include physical activity in your daily routine. Try a daily walk or other moderate aerobic exercise.  Manage stress. Find healthy ways to cope with the stressors, such as delegating tasks on your to-do list.  Practice relaxation techniques. Try deep breathing, yoga, massage and visualization.  Eat regularly. Eating regularly scheduled meals and maintaining a healthy diet might help prevent headaches. Also, drink plenty of fluids.  Follow a regular sleep schedule. Sleep deprivation might contribute to headaches Consider biofeedback. With this mind-body technique, you learn to control certain bodily functions - such as muscle tension, heart rate and blood pressure - to prevent headaches or reduce headache pain.    Proceed to emergency room if you experience new or worsening symptoms or symptoms do not resolve, if you have new neurologic symptoms or if headache is severe, or for any concerning symptom.   Discussed side effects. Serious side effects can inclue: Abdominal or stomach pain ,fever, chills, or sore throat , lessening of sensations or perception ,loss of appetite , mood or mental changes, including aggression, agitation, apathy, irritability, and mental depression , red, irritated, or bleeding gums , weight loss, TERATOGENICITY use protection and do not get pregnant. Rare Blood in the urine , decrease in sexual  performance or desire , difficult or painful urination , frequent urination , hearing loss , loss of bladder control , lower back or side pain , nosebleeds , pale skin , red or irritated eyes , ringing or buzzing in the ears , skin rash or itching , swelling , trouble breathing, Common side effects of Topamax include:  tiredness, drowsiness, dizziness, nervousness, numbness or tingly feeling, coordination problems, diarrhea, weight loss, speech/language problems, changes in vision, sensory distortion, loss of appetite, bad taste in your mouth, confusion,  slowed thinking, trouble concentrating or paying attention, memory problems,      Naomie Dean, MD  [facility] [address] [place], [place] [address]  Phone [phone] Fax [phone]  A total of 40 minutes was spent face-to-face with this patient. Over half this time was spent on counseling patient on the migraine, headache, abnormal white matter changes of brain diagnosis and different diagnostic and therapeutic options available.

## 2016-01-12 NOTE — Progress Notes (Signed)
Gave Toradol 60mg/2ml IM in right deltoid. Cleaned with alcohol wipe prior to injection. Band-aid applied. Pt tolerated well.   

## 2016-01-12 NOTE — Patient Instructions (Addendum)
Overall you are doing fairly well but I do want to suggest a few things today:   Remember to drink plenty of fluid, eat healthy meals and do not skip any meals. Try to eat protein with a every meal and eat a healthy snack such as fruit or nuts in between meals. Try to keep a regular sleep-wake schedule and try to exercise daily, particularly in the form of walking, 20-30 minutes a day, if you can.   As far as your medications are concerned, I would like to suggest: Topiramate  at bedtime for one week, then  for one week, then  at night before bed Zomig nasal spray at onset of migraine. . If it does not improve the symptoms please take one additional dose. Do not take more then 2 doses in 24hrs. Do not take use more then 2 to 3 times in a week. Medrol dosepak as prescribed  As far as diagnostic testing: MRI brain  My clinical assistant and will answer any of your questions and relay your messages to me and also relay most of my messages to you.   Our phone number is [phone]. We also have an after hours call service for urgent matters and there is a physician on-call for urgent questions. For any emergencies you know to call 911 or go to the nearest emergency room

## 2016-01-18 ENCOUNTER — Telehealth: Payer: Self-pay | Admitting: *Deleted

## 2016-01-18 NOTE — Telephone Encounter (Signed)
LVM for pt to call back. Advised I had a couple questions for her. Gave [facility] phone number for her to call.   Zomig is not covered by her insurance. Insurance requires her to try both sumatriptan (imitrex) and rizatriptan (maxalt) before they will approve zomig.  Common SE of rizatriptan: Dizziness; drowsiness; dry mouth; fatigue; nausea; weakness.

## 2016-01-20 ENCOUNTER — Other Ambulatory Visit: Payer: Medicaid Other

## 2016-01-25 ENCOUNTER — Other Ambulatory Visit: Payer: Self-pay | Admitting: Neurology

## 2016-01-25 MED ORDER — RIZATRIPTAN BENZOATE 10 MG PO TBDP: 10.0000 mg | ORAL_TABLET | Freq: Once | ORAL | Status: DC | PRN

## 2016-01-25 NOTE — Telephone Encounter (Signed)
Dr Lucia Gaskins- Can you send in rx rizatriptan instead? Zomig not covered since she has not tried this.  Called and spoke to pt. Relayed information below. Pt willing to try rizatriptan instead of Zomig first. Went over [employer]. She would like med sent to [id number] - [place], [place] - [address]. Advised I will let Dr Lucia Gaskins know and to stop medication and call if she experiences any SE. She verbalized understanding.

## 2016-01-26 NOTE — Telephone Encounter (Signed)
Med sent to pharmacy by Dr Lucia Gaskins on 01/25/16.

## 2016-02-06 ENCOUNTER — Other Ambulatory Visit: Payer: Medicaid Other

## 2016-02-06 ENCOUNTER — Inpatient Hospital Stay: Admission: RE | Admit: 2016-02-06 | Payer: Medicaid Other | Source: Ambulatory Visit

## 2016-04-04 ENCOUNTER — Telehealth: Payer: Self-pay | Admitting: Neurology

## 2016-04-04 ENCOUNTER — Other Ambulatory Visit: Payer: Self-pay | Admitting: Neurology

## 2016-04-04 DIAGNOSIS — G441 Vascular headache, not elsewhere classified: Secondary | ICD-10-CM

## 2016-04-04 DIAGNOSIS — R9082 White matter disease, unspecified: Secondary | ICD-10-CM

## 2016-04-04 NOTE — Telephone Encounter (Signed)
Patient is calling stating [facility] says another order for an MRI needs to be put in the computer since patient was unable to make appointment before.

## 2016-04-04 NOTE — Telephone Encounter (Signed)
Ok per Dr Lucia Gaskins to place order again. I placed order as requested.

## 2016-04-04 NOTE — Addendum Note (Signed)
Addended by: Eilene Ghazi L on: 04/04/2016 10:26 AM   Modules accepted: Orders

## 2016-05-01 ENCOUNTER — Other Ambulatory Visit: Payer: Self-pay | Admitting: Neurology

## 2016-05-03 ENCOUNTER — Other Ambulatory Visit: Payer: Medicaid Other

## 2016-06-05 ENCOUNTER — Telehealth: Payer: Self-pay | Admitting: Neurology

## 2016-06-05 ENCOUNTER — Telehealth: Payer: Self-pay | Admitting: *Deleted

## 2016-06-05 NOTE — Telephone Encounter (Signed)
Called patient to talk about denial of her MRI. She did not answer so I left a VM asking her to call me back.

## 2016-06-05 NOTE — Telephone Encounter (Signed)
Called pt. R/S f/u on 11/20 to 11/21 at 1:00p check in 1245pm. Dr Lucia Gaskins working in hospital that day. Pt verbalized understanding.

## 2016-07-02 ENCOUNTER — Ambulatory Visit: Payer: Medicaid Other | Admitting: Neurology

## 2016-07-03 ENCOUNTER — Ambulatory Visit (INDEPENDENT_AMBULATORY_CARE_PROVIDER_SITE_OTHER): Payer: Medicaid Other | Admitting: Neurology

## 2016-07-03 ENCOUNTER — Encounter: Payer: Self-pay | Admitting: Neurology

## 2016-07-03 VITALS — BP 96/61 | HR 68 | Ht 64.0 in | Wt 131.6 lb

## 2016-07-03 DIAGNOSIS — G43009 Migraine without aura, not intractable, without status migrainosus: Secondary | ICD-10-CM | POA: Diagnosis not present

## 2016-07-03 MED ORDER — CYCLOBENZAPRINE HCL 10 MG PO TABS: 10.0000 mg | ORAL_TABLET | Freq: Three times a day (TID) | ORAL | 11 refills | Status: DC | PRN

## 2016-07-03 MED ORDER — RIZATRIPTAN BENZOATE 10 MG PO TBDP: 10.0000 mg | ORAL_TABLET | Freq: Once | ORAL | 11 refills | Status: DC | PRN

## 2016-07-03 MED ORDER — TOPIRAMATE 25 MG PO TABS: 100.0000 mg | ORAL_TABLET | Freq: Every day | ORAL | 12 refills | Status: DC

## 2016-07-03 NOTE — Progress Notes (Signed)
[facility]    Provider:  Dr Lucia GaskinsAhern Primary Care Physician:  Kaleen MaskELKINS,WILSON OLIVER, MD   CC: Neck pain and numbness, migraines.  Interval history 07/03/2016:  She has not been compliant on the medication. She did not take the topamax or the Triptan. Had a long discussion about compliance, restarting the medication. She says she is very motivated. Discussed side effects again, other choices, migraine management. Decided to restart Topiramate as well as continue the maxalt and flexeril  Interval history 01/12/2016: Haven't seen her in a year. She has been to the ED multiple times. She has left-sided weakness and numbness with the headaches. Given cocktail of Decadron, Toradol, Reglan and Benadryl with improvement. Imitrex not working, will change to Zomig nasal spray.  I ordered an MRi of the brain and cervical spine last year to follow abnormal white matter changes on MRI, she did not have it completed and I reminded her today she will have the brain done. She has had the migraine for 4 weeks now. It is every year around time. For 30 days she has had a migraine, she has had 2 good days. She wakes up with headaches. The hedaches are worsening with vision changes. She has multiple a week, can be severe, has had the last headache for 4 weeks.   She has weekly headaches 2-3 a week. She takes tylenol a lot. She has tension around the eye. Migraines ar eon the left. Lights bother her, make her feel dizzy. She also has bad migraines 1-2x a year but they can be severe and last 30 days. She has neck pain, tightness.  HPI: Michelle Rhodes is a 35 y.o. female here as a referral from Dr. Harlene Ramuslapps for neck pain and numbness, migraines.  Migraines started at the age of 35. Migraines are worsening, always on the left side side, pulsating and throbbing, light sensitivity, sound sensitivity, 10/10 pain, they last all day and have lasted 28 days. She has to go into a dark room. She gets neck  pain on the left side. She has tingling in the left hand which radiates up the arm. Migraines are once a year and can last a month. Last migraine 2 weeks ago for about 2 days, last extended migraine last July. Takes imitrex which doesn't work. She has been to the emergency room in the past but otherwise just sits in her room for 30 days. No aura.   Left arm: She has tingling of the left fingers. Happens sporadically. She wakes up with numbness in the hand all the time. No grip weakness. She has tightness in the neck but no radicular symptoms.   Reviewed notes, labs and imaging from outside physicians, which showed:   IMPRESSIO MRI of the brain (personally reviewed images) : 1. Stable nonspecific white matter signal changes without enhancement in the anterior frontal lobe. Top differential considerations remain sequelae of migraines, demyelination, and trauma. See comparison for additional diagnostic considerations. 2. 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 Recommend 1 year follow up to document stability.  Review of Systems: Patient complains of symptoms per HPI as well as the following symptoms: Headache, numbness, weakness, passing out, anxiety, decreased energy. Pertinent negatives per HPI. All others negative.   Social History   Social History  . Marital status: Single    Spouse name: N/A  . Number of children: 1  . Years of education: 212   Occupational History  . Not on file.   Social History Main  Topics  . Smoking status: Current Every Day Smoker    Packs/day: 0.25    Years: 10.00    Types: Cigarettes  . Smokeless tobacco: Never Used  . Alcohol use No  . Drug use: No  . Sexual activity: Yes   Other Topics Concern  . Not on file   Social History Narrative   Lives at home with son.   Caffeine use: Drinks diet Mt. Dew soda (4 cans per day)       Family History  Problem Relation Age of Onset  . Hypertension Mother   . Stroke Mother   .  Hypertension Father   . Migraines Neg Hx     Past Medical History:  Diagnosis Date  . Anxiety   . Asthma    as a child  . Migraine     Past Surgical History:  Procedure Laterality Date  . ENDOMETRIAL ABLATION N/A 01/06/2015   Procedure: ENDOMETRIAL ABLATION;  Surgeon: Lawrenceville Bing, MD;  Location: [facility];  Service: Gynecology;  Laterality: N/A;  . LAPAROSCOPIC BILATERAL SALPINGO OOPHERECTOMY Bilateral 01/06/2015   Procedure: LAPAROSCOPIC BILATERAL TUBAL LIGATION with FILSIE CLIPS ;  Surgeon:  Bing, MD;  Location: [facility];  Service: Gynecology;  Laterality: Bilateral;    Current Outpatient Prescriptions  Medication Sig Dispense Refill  . clonazePAM (KLONOPIN) 0.5 MG tablet Take 0.5 mg by mouth 2 (two) times daily as needed for anxiety.    . cyclobenzaprine (FLEXERIL) 10 MG tablet Take 10 mg by mouth 3 (three) times daily as needed for muscle spasms.    Marland Kitchen HYDROcodone-acetaminophen (NORCO/VICODIN) 5-325 MG tablet Take 1 tablet by mouth every 6 (six) hours as needed for moderate pain.    Marland Kitchen ondansetron (ZOFRAN-ODT) 4 MG disintegrating tablet Take 1 tablet (4 mg total) by mouth every 8 (eight) hours as needed for nausea. Or migraine 30 tablet 12  . rizatriptan (MAXALT-MLT) 10 MG disintegrating tablet Take 1 tablet (10 mg total) by mouth once as needed for migraine. May repeat in 2 hours if needed 10 tablet 11  . SUMAtriptan (IMITREX) 100 MG tablet Take 100 mg by mouth once. Reported on 01/12/2016    . topiramate (TOPAMAX) 100 MG tablet Take 1 tablet (100 mg total) by mouth at bedtime. 30 tablet 12  . zolmitriptan (ZOMIG) 5 MG nasal solution Place 1 spray into the nose as needed for migraine. 12 Units 11  . gabapentin (NEURONTIN) 100 MG capsule Take 3 capsules (300 mg total) by mouth 3 (three) times daily. 45 capsule 0   No current facility-administered medications for this visit.     Allergies as of 07/03/2016  . (No Known Allergies)    Vitals: BP 96/61   Pulse 68   Ht  5\' 4"  (1.626 m)   Wt 131 lb 9.6 oz (59.7 kg)   BMI 22.59 kg/m  Last Weight:  Wt Readings from Last 1 Encounters:  07/03/16 131 lb 9.6 oz (59.7 kg)   Last Height:   Ht Readings from Last 1 Encounters:  07/03/16 5\' 4"  (1.626 m)       Physical exam: Exam: Gen: NAD, conversant, well nourised, well groomed  CV: RRR, no MRG. No Carotid Bruits. No peripheral edema, warm, nontender Eyes: Conjunctivae clear without exudates or hemorrhage  Neuro: Detailed Neurologic Exam  Speech:  Speech is normal; fluent and spontaneous with normal comprehension.  Cognition:  The patient is oriented to person, place, and time;   recent and remote memory intact;   language fluent;  normal attention, concentration,   fund of knowledge Cranial Nerves:  The pupils are equal, round, and reactive to light. The fundi are normal and spontaneous venous pulsations are present. Visual fields are full to finger confrontation. Extraocular movements are intact. Trigeminal sensation is intact and the muscles of mastication are normal. The face is symmetric. The palate elevates in the midline. Hearing intact. Voice is normal. Shoulder shrug is normal. The tongue has normal motion without fasciculations.   Coordination:  Normal finger to nose and heel to shin. Normal rapid alternating movements.   Gait:  Heel-toe and tandem gait are normal.   Motor Observation:  No asymmetry, no atrophy, and no involuntary movements noted. Tone:  Normal muscle tone.   Posture:  Posture is normal. normal erect   Strength:  Strength is V/V in the upper and lower limbs.    Sensation: intact to LT   Reflex Exam:  DTR's:  Deep tendon reflexes in the upper and lower extremities are normal bilaterally.  Toes:  The toes are downgoing bilaterally.  Clonus:  Clonus is absent.   Assessment/Plan: 35 year old female with a past medical history  of migraines, abnormal white matter changes on last MRI in 2011, left arm and hand pain and paresthesias. Discussed below again  Will repeat MRI of the brain to follow abnormal white matter changes seen in 2011: was not completed. She can [facility] to schedule. As far as your medications are concerned, I would like to suggest: Topiramate 25mg  at bedtime for one week, then 50mg  for one week, then 100mg  at night before bed Maxalt at onset of migraine. . If it does not improve the symptoms please take one additional dose. Do not take more then 2 doses in 24hrs. Do not take use more then 2 to 3 times in a week  To prevent or relieve headaches, try the following:  Cool Compress. Lie down and place a cool compress on your head.   Avoid headache triggers. If certain foods or odors seem to have triggered your migraines in the past, avoid them. A headache diary might help you identify triggers.   Include physical activity in your daily routine. Try a daily walk or other moderate aerobic exercise.   Manage stress. Find healthy ways to cope with the stressors, such as delegating tasks on your to-do list.   Practice relaxation techniques. Try deep breathing, yoga, massage and visualization.   Eat regularly. Eating regularly scheduled meals and maintaining a healthy diet might help prevent headaches. Also, drink plenty of fluids.   Follow a regular sleep schedule. Sleep deprivation might contribute to headaches  Consider biofeedback. With this mind-body technique, you learn to control certain bodily functions - such as muscle tension, heart rate and blood pressure - to prevent headaches or reduce headache pain.    Proceed to emergency room if you experience new or worsening symptoms or symptoms do not resolve, if you have new neurologic symptoms or if headache is severe, or for any concerning symptom.   Discussed side effects. Serious side effects can inclue: Abdominal or stomach  pain ,fever, chills, or sore throat , lessening of sensations or perception ,loss of appetite , mood or mental changes, including aggression, agitation, apathy, irritability, and mental depression , red, irritated, or bleeding gums , weight loss, TERATOGENICITY use protection and do not get pregnant. Rare Blood in the urine , decrease in sexual performance or desire , difficult or painful urination , frequent urination , hearing loss , loss  of bladder control , lower back or side pain , nosebleeds , pale skin , red or irritated eyes , ringing or buzzing in the ears , skin rash or itching , swelling , trouble breathing, Common side effects of Topamax include:  tiredness, drowsiness, dizziness, nervousness, numbness or tingly feeling, coordination problems, diarrhea, weight loss, speech/language problems, changes in vision, sensory distortion, loss of appetite, bad taste in your mouth, confusion, slowed thinking, trouble concentrating or paying attention, memory problems,   CC: Dr. Reita Chard, MD  [facility] [address] [place], [place] [address]  Phone [phone] Fax [phone]  A total of 30 minutes was spent face-to-face with this patient. Over half this time was spent on counseling patient on the migraine diagnosis and different diagnostic and therapeutic options available.

## 2016-07-03 NOTE — Patient Instructions (Signed)
Remember to drink plenty of fluid, eat healthy meals and do not skip any meals. Try to eat protein with a every meal and eat a healthy snack such as fruit or nuts in between meals. Try to keep a regular sleep-wake schedule and try to exercise daily, particularly in the form of walking, 20-30 minutes a day, if you can.   As far as your medications are concerned, I would like to suggest: Topiramate 25mg  for one week, 50mg  for one week, 75mg  for one week and then 100mg  at bedtime  As far as diagnostic testing: MRI of the brain w/wo contrast  I would like to see you back in 3 months, sooner if we need to. Please call us with any interim questions, concerns, problems, updates or refill requests.   Our phone number is [phone]. We also have an after hours call service for urgent matters and there is a physician on-call for urgent questions. For any emergencies you know to call 911 or go to the nearest emergency room

## 2016-07-16 ENCOUNTER — Ambulatory Visit: Payer: Medicaid Other | Admitting: Neurology

## 2016-08-13 HISTORY — PX: BREAST BIOPSY: SHX20

## 2016-10-09 ENCOUNTER — Ambulatory Visit: Payer: Medicaid Other | Admitting: Adult Health

## 2016-10-15 ENCOUNTER — Telehealth: Payer: Self-pay | Admitting: Neurology

## 2016-10-15 NOTE — Telephone Encounter (Signed)
[employer] did not approve the MRI. The case number is [id number] and the phone number for the peer to peer is [phone]. She is scheduled to have exam done at [facility] on Sunday 10/25/16

## 2016-10-16 NOTE — Telephone Encounter (Signed)
Irving Burton, I called and this is still in physician review they will have an answer tomorrow and it may get approved. Not time for peer-to-peer yet. They will have an answer tomorrow and if it is not approved then I call for peer-to-peer. Can you check on this tomorrow or Thursday and let me know? Thanks!

## 2016-10-17 NOTE — Telephone Encounter (Signed)
Okay thank you for your help.. Yes I will keep checking on it.

## 2016-10-18 NOTE — Telephone Encounter (Signed)
Alvino Chapel with [facility] informed me that Medicaid denied the MR.. The phone number for the peer to peer is [phone] and the case number is [id number].Marland Kitchen She is scheduled to have the image on

## 2016-10-18 NOTE — Telephone Encounter (Signed)
She is scheduled on Thursday 10/25/16

## 2016-10-19 NOTE — Telephone Encounter (Signed)
I am not sure if I routed this.. If I didn't i'm sorry.

## 2016-10-22 NOTE — Telephone Encounter (Signed)
I called and provided more information, she should reschedule her MRI just in case to a later date. Response will take 1 business day. Should have their decision by end of business tomorrow by phone or fax.

## 2016-10-23 NOTE — Telephone Encounter (Signed)
Thank you for your help. I will let Alvino Chapel know.

## 2016-10-24 NOTE — Telephone Encounter (Signed)
[employer]icaid has denied the MRI. Alvino Chapel with [facility] called the patient and informed her of the denial and canceled her appointment.

## 2016-10-25 ENCOUNTER — Other Ambulatory Visit: Payer: Medicaid Other

## 2016-11-26 ENCOUNTER — Ambulatory Visit: Payer: Medicaid Other | Admitting: Adult Health

## 2016-11-26 ENCOUNTER — Ambulatory Visit: Payer: Medicaid Other | Admitting: Neurology

## 2016-12-11 ENCOUNTER — Ambulatory Visit (INDEPENDENT_AMBULATORY_CARE_PROVIDER_SITE_OTHER): Payer: Medicaid Other | Admitting: Neurology

## 2016-12-11 ENCOUNTER — Encounter: Payer: Self-pay | Admitting: Neurology

## 2016-12-11 VITALS — BP 104/66 | HR 74 | Ht 63.5 in | Wt 121.6 lb

## 2016-12-11 DIAGNOSIS — R29898 Other symptoms and signs involving the musculoskeletal system: Secondary | ICD-10-CM | POA: Diagnosis not present

## 2016-12-11 DIAGNOSIS — R55 Syncope and collapse: Secondary | ICD-10-CM

## 2016-12-11 DIAGNOSIS — G35 Multiple sclerosis: Secondary | ICD-10-CM

## 2016-12-11 DIAGNOSIS — R419 Unspecified symptoms and signs involving cognitive functions and awareness: Secondary | ICD-10-CM

## 2016-12-11 DIAGNOSIS — E348 Other specified endocrine disorders: Secondary | ICD-10-CM | POA: Diagnosis not present

## 2016-12-11 DIAGNOSIS — R200 Anesthesia of skin: Secondary | ICD-10-CM | POA: Diagnosis not present

## 2016-12-11 MED ORDER — TOPIRAMATE 100 MG PO TABS: 150.0000 mg | ORAL_TABLET | Freq: Every day | ORAL | 12 refills | Status: DC

## 2016-12-11 NOTE — Progress Notes (Signed)
[facility]    Provider:  Dr Lucia Gaskins Referring Provider: Kaleen Mask, * Primary Care Physician:  Kaleen Mask, MD  CC: Neck pain and numbness, migraines.  Interval history 12/11/2016: Michelle Rhodes is having episodes of near-syncope. And Michelle Rhodes has left arm numbness and weakness which continues. Michelle Rhodes has a FHX of stroke in Michelle Rhodes mother in Michelle Rhodes 30s. Michelle Rhodes has neck pain. Michelle Rhodes migraines have improved but Michelle Rhodes still gets headaches. The left arm numbness and weakness has been persistent and ongoing for several years, recently worsened with noticeable weakness in that arm. Michelle Rhodes has continued eye blurriness. With the pre-syncopal episodes vision in both eyes can be affected and decreased with complete vision loss bilaterally but no alteration of awareness or consciousness; Michelle Rhodes can hear things around Michelle Rhodes but has difficulty comprehending what is being said, no urination or tongue biting.   Migraine medications tried: Gabapentin, Topiramate, Imitrex, Maxalt, zomig, zofran, flexeril, Zoloft  Interval history 07/03/2016:  Michelle Rhodes has not been compliant on the medication. Michelle Rhodes did not take the topamax or the Triptan. Had a long discussion about compliance, restarting the medication. Michelle Rhodes says Michelle Rhodes is very motivated. Discussed side effects again, other choices, migraine management. Decided to restart Topiramate as well as continue the maxalt and flexeril  Interval history 01/12/2016: Haven't seen Michelle Rhodes in a year. Michelle Rhodes has been to the ED multiple times. Michelle Rhodes has left-sided weakness and numbness with the headaches. Given cocktail of Decadron, Toradol, Reglan and Benadryl with improvement. Imitrex not working, will change to Zomig nasal spray. I ordered an MRi of the brain and cervical spine last year to follow abnormal white matter changes on MRI, Michelle Rhodes did not have it completed and I reminded Michelle Rhodes today Michelle Rhodes will have the brain done. Michelle Rhodes has had the migraine for 4 weeks now. It is every year around time. For 30  days Michelle Rhodes has had a migraine, Michelle Rhodes has had 2 good days. Michelle Rhodes wakes up with headaches. The hedaches are worsening with vision changes. Michelle Rhodes has multiple a week, can be severe, has had the last headache for 4 weeks.   Michelle Rhodes has weekly headaches 2-3 a week. Michelle Rhodes takes tylenol a lot. Michelle Rhodes has tension around the eye. Migraines ar eon the left. Lights bother Michelle Rhodes, make Michelle Rhodes feel dizzy. Michelle Rhodes also has bad migraines 1-2x a year but they can be severe and last 30 days. Michelle Rhodes has neck pain, tightness.  HPI: Michelle Rhodes is a 36 y.o. female here as a referral from Dr. Harlene Ramus for neck pain and numbness, migraines.  Migraines started at the age of 71. Migraines are worsening, always on the left side side, pulsating and throbbing, light sensitivity, sound sensitivity, 10/10 pain, they last all day and have lasted 28 days. Michelle Rhodes has to go into a dark room. Michelle Rhodes gets neck pain on the left side. Michelle Rhodes has tingling in the left hand which radiates up the arm. Migraines are once a year and can last a month. Last migraine 2 weeks ago for about 2 days, last extended migraine last July. Takes imitrex which doesn't work. Michelle Rhodes has been to the emergency room in the past but otherwise just sits in Michelle Rhodes room for 30 days. No aura.   Left arm: Michelle Rhodes has tingling of the left fingers. Happens sporadically. Michelle Rhodes wakes up with numbness in the hand all the time. No grip weakness. Michelle Rhodes has tightness in the neck but no radicular symptoms.   Reviewed notes, labs and imaging from outside physicians, which showed:   IMPRESSIO  MRI of the brain (personally reviewed images) : 1. Stable nonspecific white matter signal changes without enhancement in the anterior frontal lobe. Top differential considerations remain sequelae of migraines, demyelination, and trauma. See comparison for additional diagnostic considerations. 2. 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 Recommend 1 year follow up to document stability.  Review of  Systems: Patient complains of symptoms per HPI as well as the following symptoms: Headache, numbness, weakness, passing out, anxiety, decreased energy. Pertinent negatives per HPI. All others negative.   Social History   Social History  . Marital status: Single    Spouse name: N/A  . Number of children: 1  . Years of education: 75   Occupational History  . Not on file.   Social History Main Topics  . Smoking status: Current Every Day Smoker    Packs/day: 0.25    Years: 10.00    Types: Cigarettes  . Smokeless tobacco: Never Used  . Alcohol use No  . Drug use: No  . Sexual activity: Yes   Other Topics Concern  . Not on file   Social History Narrative   Lives at home with son.   Caffeine use: Drinks diet Mt. Dew soda (4 cans per day)       Family History  Problem Relation Age of Onset  . Hypertension Mother   . Stroke Mother   . Hypertension Father   . Migraines Neg Hx     Past Medical History:  Diagnosis Date  . Anxiety   . Asthma    as a child  . Migraine     Past Surgical History:  Procedure Laterality Date  . ENDOMETRIAL ABLATION N/A 01/06/2015   Procedure: ENDOMETRIAL ABLATION;  Surgeon: Quincy Bing, MD;  Location: [facility] ORS;  Service: Gynecology;  Laterality: N/A;  . LAPAROSCOPIC BILATERAL SALPINGO OOPHERECTOMY Bilateral 01/06/2015   Procedure: LAPAROSCOPIC BILATERAL TUBAL LIGATION with FILSIE CLIPS ;  Surgeon: Kenney Bing, MD;  Location: [facility] ORS;  Service: Gynecology;  Laterality: Bilateral;    Current Outpatient Prescriptions  Medication Sig Dispense Refill  . cyclobenzaprine (FLEXERIL) 10 MG tablet Take 1 tablet (10 mg total) by mouth 3 (three) times daily as needed for muscle spasms. 30 tablet 11  . rizatriptan (MAXALT-MLT) 10 MG disintegrating tablet Take 1 tablet (10 mg total) by mouth once as needed for migraine. May repeat in 2 hours if needed 10 tablet 11  . clonazePAM (KLONOPIN) 0.5 MG tablet Take 0.5 mg by mouth 2 (two) times daily as  needed for anxiety.    . gabapentin (NEURONTIN) 100 MG capsule Take 3 capsules (300 mg total) by mouth 3 (three) times daily. 45 capsule 0  . ondansetron (ZOFRAN-ODT) 4 MG disintegrating tablet Take 1 tablet (4 mg total) by mouth every 8 (eight) hours as needed for nausea. Or migraine (Patient not taking: Reported on 12/11/2016) 30 tablet 12  . topiramate (TOPAMAX) 100 MG tablet Take 1.5 tablets (150 mg total) by mouth at bedtime. 45 tablet 12   No current facility-administered medications for this visit.     Allergies as of 12/11/2016  . (No Known Allergies)    Vitals: BP 104/66   Pulse 74   Ht 5' 3.5" (1.613 m)   Wt 121 lb 9.6 oz (55.2 kg)   BMI 21.20 kg/m  Last Weight:  Wt Readings from Last 1 Encounters:  12/11/16 121 lb 9.6 oz (55.2 kg)   Last Height:   Ht Readings from Last 1 Encounters:  12/11/16 5'  3.5" (1.613 m)   Physical exam: Exam: Gen: NAD, conversant, well nourised, thin , well groomed                     CV: RRR, no MRG. No Carotid Bruits. No peripheral edema, warm, nontender Eyes: Conjunctivae clear without exudates or hemorrhage  Neuro: Detailed Neurologic Exam  Speech:    Speech is normal; fluent and spontaneous with normal comprehension.  Cognition:    The patient is oriented to person, place, and time;     recent and remote memory intact;     language fluent;     normal attention, concentration,     fund of knowledge Cranial Nerves:    The pupils are equal, round, and reactive to light. The fundi are normal and spontaneous venous pulsations are present. Visual fields are full to finger confrontation. Extraocular movements are intact. Trigeminal sensation is intact and the muscles of mastication are normal. The face is symmetric. The palate elevates in the midline. Hearing intact. Voice is normal. Shoulder shrug is normal. The tongue has normal motion without fasciculations.   Coordination:    Normal finger to nose and heel to shin. Normal rapid  alternating movements.   Gait:    Heel-toe and tandem gait are normal.   Motor Observation:    No asymmetry, no atrophy, and no involuntary movements noted. Tone:    Normal muscle tone.    Posture:    Posture is normal. normal erect    Strength: Left arm weakness 4/5. Otherwise strength is V/V in the upper and lower limbs.      Sensation: dec to pin prick left arm.      Reflex Exam:  DTR's:    Deep tendon reflexes in the upper and lower extremities are normal bilaterally.   Toes:    The toes are downgoing bilaterally.   Clonus:    Clonus is absent.      Assessment/Plan: 36 year old female with a past medical history of migraines, abnormal white matter changes on last MRI in 2011, left arm and hand pain and paresthesias. Discussed below again  - Will repeat MRI of the brain to follow abnormal white matter changes seen in 2011:which could be due to demyelination, need to evaluate for MS given white matter changes seen in 2011 as well as weakness left arm amd numbness left arm, this exacerbation may be MS exacerbation.  MRI brain and cervical spine w/wo contrast. Also seen on MRi in 2011was a pineal cyst, 12 x 13 x 15 mm and a one year follow up was recommended  - As far as your medications are concerned, I would like to suggest:Increase Topiramate to 150mg  daily - Maxalt at onset of migraine. . If it does not improve the symptoms please take one additional dose. Do not take more then 2 doses in 24hrs. Do not take use more then 2 to 3 times in a week  Orders Placed This Encounter  Procedures  . MR BRAIN W WO CONTRAST  . MR CERVICAL SPINE W WO CONTRAST    Naomie Dean, MD  [facility] [address] [place], [place] [address]  Phone [phone] Fax [phone]  A total of 30 minutes was spent face-to-face with this patient. Over half this time was spent on counseling patient on the left arm numbness and weakness  diagnosis and  different diagnostic and therapeutic options available.

## 2016-12-11 NOTE — Patient Instructions (Addendum)
Remember to drink plenty of fluid, eat healthy meals and do not skip any meals. Try to eat protein with a every meal and eat a healthy snack such as fruit or nuts in between meals. Try to keep a regular sleep-wake schedule and try to exercise daily, particularly in the form of walking, 20-30 minutes a day, if you can.   As far as your medications are concerned, I would like to suggest: Increase Topiramate to 150mg  in the evenings  As far as diagnostic testing: Reorder MRI cervical spine and brain, lab  I would like to see you back in 6-9 months, sooner if we need to. Please call us with any interim questions, concerns, problems, updates or refill requests.   Our phone number is [phone]. We also have an after hours call service for urgent matters and there is a physician on-call for urgent questions. For any emergencies you know to call 911 or go to the nearest emergency room

## 2017-01-14 ENCOUNTER — Ambulatory Visit
Admission: RE | Admit: 2017-01-14 | Discharge: 2017-01-14 | Disposition: A | Discharge status: Home / Self Care | Payer: Medicaid Other | Source: Ambulatory Visit | Attending: Neurology | Admitting: Neurology

## 2017-01-14 DIAGNOSIS — E348 Other specified endocrine disorders: Secondary | ICD-10-CM | POA: Diagnosis not present

## 2017-01-14 DIAGNOSIS — G35 Multiple sclerosis: Secondary | ICD-10-CM

## 2017-01-14 DIAGNOSIS — R200 Anesthesia of skin: Secondary | ICD-10-CM

## 2017-01-14 DIAGNOSIS — R55 Syncope and collapse: Secondary | ICD-10-CM

## 2017-01-14 DIAGNOSIS — R29898 Other symptoms and signs involving the musculoskeletal system: Secondary | ICD-10-CM

## 2017-01-14 DIAGNOSIS — R419 Unspecified symptoms and signs involving cognitive functions and awareness: Secondary | ICD-10-CM | POA: Diagnosis not present

## 2017-01-14 MED ORDER — GADOBENATE DIMEGLUMINE 529 MG/ML IV SOLN
10.0000 mL | Freq: Once | INTRAVENOUS | Status: AC | PRN
  Administered 2017-01-14: 10 mL via INTRAVENOUS

## 2017-04-26 ENCOUNTER — Ambulatory Visit: Payer: Self-pay | Admitting: Obstetrics & Gynecology

## 2017-04-29 ENCOUNTER — Ambulatory Visit: Payer: Medicaid Other | Admitting: Obstetrics & Gynecology

## 2017-04-30 ENCOUNTER — Telehealth: Payer: Self-pay | Admitting: Obstetrics & Gynecology

## 2017-04-30 ENCOUNTER — Encounter: Payer: Self-pay | Admitting: Obstetrics & Gynecology

## 2017-04-30 ENCOUNTER — Ambulatory Visit (INDEPENDENT_AMBULATORY_CARE_PROVIDER_SITE_OTHER): Payer: Medicaid Other | Admitting: Obstetrics & Gynecology

## 2017-04-30 VITALS — BP 100/60 | HR 72 | Temp 97.9°F | Ht 64.0 in | Wt 123.0 lb

## 2017-04-30 DIAGNOSIS — N6322 Unspecified lump in the left breast, upper inner quadrant: Secondary | ICD-10-CM

## 2017-04-30 DIAGNOSIS — N6320 Unspecified lump in the left breast, unspecified quadrant: Secondary | ICD-10-CM | POA: Insufficient documentation

## 2017-04-30 NOTE — Progress Notes (Signed)
HPI:      [name] is a 36 y.o. [phone] who is premenopausal, presents today for a problem visit.  She complains of breast lump on the leftside which she first noticed 2 mos.  It has increased.  Associated symptoms include no pain or skin changes.  Denies nipple discharge or skin changes.  No fever.  Prior Mammogram: 6 years ago [facility]) Prior breast problems: Yes.  Had biopsy of cyst and notation of FCC (biopsy was in Left Breast near where current mass is).  She says they left a marker there. Family History: none.  PMHx: She  has a past medical history of Anxiety; Asthma; and Migraine. Also,  has a past surgical history that includes Laparoscopic bilateral salpingo oophorectomy (Bilateral, 01/06/2015) and Endometrial ablation (N/A, 01/06/2015)., family history includes Hypertension in her father and mother; Stroke in her mother.,  reports that she has been smoking Cigarettes.  She has a 2.50 pack-year smoking history. She has never used smokeless tobacco. She reports that she does not drink alcohol or use drugs.  She has a current medication list which includes the following prescription(s): cyclobenzaprine, rizatriptan, topiramate, clonazepam, gabapentin, and ondansetron. Also, has No Known Allergies.  Review of Systems  Constitutional: Negative for chills, fever and malaise/fatigue.  HENT: Negative for congestion, sinus pain and sore throat.   Eyes: Negative for blurred vision and pain.  Respiratory: Negative for cough and wheezing.   Cardiovascular: Negative for chest pain and leg swelling.  Gastrointestinal: Negative for abdominal pain, constipation, diarrhea, heartburn, nausea and vomiting.  Genitourinary: Negative for dysuria, frequency, hematuria and urgency.  Musculoskeletal: Negative for back pain, joint pain, myalgias and neck pain.  Skin: Negative for itching and rash.  Neurological: Negative for dizziness, tremors and weakness.  Endo/Heme/Allergies: Does not  bruise/bleed easily.  Psychiatric/Behavioral: Negative for depression. The patient is not nervous/anxious and does not have insomnia.     Objective: BP 100/60   Pulse 72   Temp 97.9 F (36.6 C)   Ht 5\' 4"  (1.626 m)   Wt 123 lb (55.8 kg)   BMI 21.11 kg/m  Physical Exam  Constitutional: She is oriented to person, place, and time. She appears well-developed and well-nourished. No distress.  Cardiovascular: Normal rate, regular rhythm, normal heart sounds and normal pulses.  Exam reveals no gallop and no friction rub.   No murmur heard. Pulmonary/Chest: Effort normal and breath sounds normal. She exhibits no mass, no tenderness and no edema. Right breast exhibits no inverted nipple, no mass, no nipple discharge, no skin change and no tenderness. Left breast exhibits mass. Left breast exhibits no inverted nipple, no nipple discharge, no skin change and no tenderness. There is no breast swelling.  FCC Bilaterally UIQ 1 cm mobile NT firm mass w irreg borders (4 cm from areola, at 1100 position)  Abdominal: Normal appearance.  Musculoskeletal: Normal range of motion.  Lymphadenopathy:    She has no axillary adenopathy.       Right axillary: No pectoral and no lateral adenopathy present.       Left axillary: No pectoral and no lateral adenopathy present. Neurological: She is alert and oriented to person, place, and time.  Skin: Skin is warm and dry. No abrasion, no bruising, no lesion and no rash noted. No erythema.  Psychiatric: She has a normal mood and affect. Her speech is normal and behavior is normal. Judgment normal.  Vitals reviewed.   ASSESSMENT/PLAN: breast mass, suspicious 1. Breast mass, left - MM DIAG  BREAST TOMO UNI LEFT; Future - US BREAST LTD UNI LEFT INC AXILLA; Future - Referral based on results  Annamarie Major, MD, [facility], [facility] 04/30/2017  4:06 PM

## 2017-04-30 NOTE — Telephone Encounter (Signed)
Patient is aware of first available appointment on Monday, 05/06/17, check-in at 12:40pm and appointment at 1pm at [facility]. Patient was given directions.

## 2017-04-30 NOTE — Telephone Encounter (Signed)
-----   Message from Nadara Mustard, MD sent at 04/30/2017  4:02 PM EDT ----- Regarding: Breast Mass [facility] Breast Mass Dx MMG and [place] ordered UIQ, 1cm (LEFT)

## 2017-05-06 ENCOUNTER — Ambulatory Visit
Admission: RE | Admit: 2017-05-06 | Discharge: 2017-05-06 | Disposition: A | Discharge status: Home / Self Care | Payer: Medicaid Other | Source: Ambulatory Visit | Attending: Obstetrics & Gynecology | Admitting: Obstetrics & Gynecology

## 2017-05-06 ENCOUNTER — Other Ambulatory Visit: Payer: Self-pay | Admitting: Obstetrics & Gynecology

## 2017-05-06 DIAGNOSIS — N6320 Unspecified lump in the left breast, unspecified quadrant: Secondary | ICD-10-CM

## 2017-05-07 NOTE — Initial Assessments (Signed)
D/w pt recent MMG and [place] of left breast mass Plan is to have US guided biopsy tomorrow at [facility] radiology center All questions answered, reassurance given PH

## 2017-05-08 ENCOUNTER — Ambulatory Visit
Admission: RE | Admit: 2017-05-08 | Discharge: 2017-05-08 | Disposition: A | Discharge status: Home / Self Care | Payer: Medicaid Other | Source: Ambulatory Visit | Attending: Obstetrics & Gynecology | Admitting: Obstetrics & Gynecology

## 2017-05-08 ENCOUNTER — Other Ambulatory Visit: Payer: Self-pay | Admitting: Obstetrics & Gynecology

## 2017-05-08 DIAGNOSIS — N6320 Unspecified lump in the left breast, unspecified quadrant: Secondary | ICD-10-CM

## 2017-05-29 ENCOUNTER — Encounter: Payer: Self-pay | Admitting: Podiatry

## 2017-05-29 ENCOUNTER — Ambulatory Visit (INDEPENDENT_AMBULATORY_CARE_PROVIDER_SITE_OTHER): Payer: Medicaid Other | Admitting: Podiatry

## 2017-05-29 VITALS — BP 109/73 | HR 78 | Resp 16

## 2017-05-29 DIAGNOSIS — L600 Ingrowing nail: Secondary | ICD-10-CM | POA: Diagnosis not present

## 2017-05-29 MED ORDER — NEOMYCIN-POLYMYXIN-HC 1 % OT SOLN: OTIC | 1 refills | Status: DC

## 2017-05-29 NOTE — Patient Instructions (Signed)

## 2017-05-29 NOTE — Progress Notes (Signed)
  Subjective:  Patient ID: Michelle Rhodes, female    DOB: 01/31/1981,  MRN: [id number] HPI Chief Complaint  Patient presents with  . Toe Pain    Hallux right- lateral border, red, swollen and painful x 1 week, pedicurist tried to trim out as well as PCP - also Rx'd antibiotic she started Monday, soaking and applying neosporin    36 y.o. female presents with the above complaint.     Past Medical History:  Diagnosis Date  . Anxiety   . Asthma    as a child  . Migraine    Past Surgical History:  Procedure Laterality Date  . BREAST BIOPSY Left 2012   x 2   . ENDOMETRIAL ABLATION N/A 01/06/2015   Procedure: ENDOMETRIAL ABLATION;  Surgeon: [place] Bingharlie Pickens, MD;  Location: [facility] ORS;  Service: Gynecology;  Laterality: N/A;  . LAPAROSCOPIC BILATERAL SALPINGO OOPHERECTOMY Bilateral 01/06/2015   Procedure: LAPAROSCOPIC BILATERAL TUBAL LIGATION with FILSIE CLIPS ;  Surgeon: [place] Bingharlie Pickens, MD;  Location: [facility] ORS;  Service: Gynecology;  Laterality: Bilateral;    Current Outpatient Prescriptions:  .  cyclobenzaprine (FLEXERIL) 10 MG tablet, Take 1 tablet (10 mg total) by mouth 3 (three) times daily as needed for muscle spasms., Disp: 30 tablet, Rfl: 11 .  NEOMYCIN-POLYMYXIN-HYDROCORTISONE (CORTISPORIN) 1 % SOLN OTIC solution, Apply 1-2 drops to toe BID after soaking, Disp: 10 mL, Rfl: 1 .  rizatriptan (MAXALT-MLT) 10 MG disintegrating tablet, Take 1 tablet (10 mg total) by mouth once as needed for migraine. May repeat in 2 hours if needed, Disp: 10 tablet, Rfl: 11 .  topiramate (TOPAMAX) 100 MG tablet, Take 1.5 tablets (150 mg total) by mouth at bedtime., Disp: 45 tablet, Rfl: 12  No Known Allergies Review of Systems  Constitutional: Positive for fever.  HENT: Positive for trouble swallowing.   All other systems reviewed and are negative.  Objective:   Vitals:   05/29/17 1342  BP: 109/73  Pulse: 78  Resp: 16    General: Well developed, nourished, in no acute distress, alert and  oriented x3   Dermatological: Skin is warm, dry and supple bilateral. Nails x 10 are well maintained; remaining integument appears unremarkable at this time. There are no open sores, no preulcerative lesions, no rash or signs of infection present. Sharp incurvated nail margin fibular border of the hallux right. Painful on palpation. Mild erythema no cellulitis drainage or odor abscess is present.  Vascular: Dorsalis Pedis artery and Posterior Tibial artery pedal pulses are 2/4 bilateral with immedate capillary fill time. Pedal hair growth present. No varicosities and no lower extremity edema present bilateral.   Neruologic: Grossly intact via light touch bilateral. Vibratory intact via tuning fork bilateral. Protective threshold with Semmes Wienstein monofilament intact to all pedal sites bilateral. Patellar and Achilles deep tendon reflexes 2+ bilateral. No Babinski or clonus noted bilateral.   Musculoskeletal: No gross boney pedal deformities bilateral. No pain, crepitus, or limitation noted with foot and ankle range of motion bilateral. Muscular strength 5/5 in all groups tested bilateral.  Gait: Unassisted, Nonantalgic.    Radiographs:  None taken  Assessment & Plan:   Assessment: Ingrown toenail fibular border hallux right  Plan: Chemical matrixectomy was performed today after local anesthesia was administered. She tolerated procedure well. A prescription for Cortisporin Otic was written she will apply twice daily after soaking. She is provided with both oral and written home instructions for care and soaking of her toe.     Deshanta Lady T. GarrettHyatt, [place] Dakota[place]PM

## 2017-06-06 ENCOUNTER — Ambulatory Visit: Payer: Medicaid Other | Admitting: Podiatry

## 2017-06-10 ENCOUNTER — Ambulatory Visit: Payer: Medicaid Other | Admitting: Podiatry

## 2017-07-15 ENCOUNTER — Telehealth: Payer: Self-pay | Admitting: *Deleted

## 2017-07-15 ENCOUNTER — Ambulatory Visit: Payer: Medicaid Other | Admitting: Neurology

## 2017-07-15 NOTE — Telephone Encounter (Signed)
Patient no show f/u appt on 07/15/2017 @ 10:00.

## 2017-07-16 ENCOUNTER — Encounter: Payer: Self-pay | Admitting: Neurology

## 2018-01-11 ENCOUNTER — Other Ambulatory Visit: Payer: Self-pay | Admitting: Neurology

## 2018-04-21 ENCOUNTER — Encounter

## 2018-04-21 ENCOUNTER — Ambulatory Visit (INDEPENDENT_AMBULATORY_CARE_PROVIDER_SITE_OTHER): Payer: Medicaid Other | Admitting: Neurology

## 2018-04-21 ENCOUNTER — Encounter: Payer: Self-pay | Admitting: Neurology

## 2018-04-21 VITALS — BP 97/65 | HR 58 | Ht 64.0 in | Wt 135.0 lb

## 2018-04-21 DIAGNOSIS — G43711 Chronic migraine without aura, intractable, with status migrainosus: Secondary | ICD-10-CM

## 2018-04-21 MED ORDER — GALCANEZUMAB-GNLM 120 MG/ML ~~LOC~~ SOAJ: 240.0000 mg | Freq: Once | SUBCUTANEOUS | 0 refills | Status: AC

## 2018-04-21 MED ORDER — GALCANEZUMAB-GNLM 120 MG/ML ~~LOC~~ SOAJ: 120.0000 mg | SUBCUTANEOUS | 11 refills | Status: DC

## 2018-04-21 NOTE — Patient Instructions (Signed)
Galcanezumab: Patient drug information Access [employer] Online here. Copyright 1978-2019 [employer] All rights reserved. (For additional information see "Galcanezumab: Drug information") Brand Names: [place]  Emgality;  Emgality (300 MG Dose)  What is this drug used for?   It is used to prevent migraine headaches.   It is used to treat cluster headaches.  What do I need to tell my doctor BEFORE I take this drug?   If you have an allergy to this drug or any part of this drug.   If you are allergic to any drugs like this one, any other drugs, foods, or other substances. Tell your doctor about the allergy and what signs you had, like rash; hives; itching; shortness of breath; wheezing; cough; swelling of face, lips, tongue, or throat; or any other signs.   This drug may interact with other drugs or health problems.   Tell your doctor and pharmacist about all of your drugs (prescription or OTC, natural products, vitamins) and health problems. You must check to make sure that it is safe for you to take this drug with all of your drugs and health problems. Do not start, stop, or change the dose of any drug without checking with your doctor.  What are some things I need to know or do while I take this drug?   Tell all of your health care providers that you take this drug. This includes your doctors, nurses, pharmacists, and dentists.   Do not share pen or cartridge devices with another person even if the needle has been changed. Sharing these devices may pass infections from one person to another. This includes infections you may not know you have.   Tell your doctor if you are pregnant, plan on getting pregnant, or are breast-feeding. You will need to talk about the benefits and risks to you and the baby.  What are some side effects that I need to call my doctor about right away?   WARNING/CAUTION: Even though it may be rare, some people may have very bad and sometimes deadly side effects when  taking a drug. Tell your doctor or get medical help right away if you have any of the following signs or symptoms that may be related to a very bad side effect:   Signs of an allergic reaction, like rash; hives; itching; red, swollen, blistered, or peeling skin with or without fever; wheezing; tightness in the chest or throat; trouble breathing, swallowing, or talking; unusual hoarseness; or swelling of the mouth, face, lips, tongue, or throat.  What are some other side effects of this drug?   All drugs may cause side effects. However, many people have no side effects or only have minor side effects. Call your doctor or get medical help if any of these side effects or any other side effects bother you or do not go away:   Irritation where the shot is given.   These are not all of the side effects that may occur. If you have questions about side effects, call your doctor. Call your doctor for medical advice about side effects.   You may report side effects to your national health agency.  How is this drug best taken?   Use this drug as ordered by your doctor. Read all information given to you. Follow all instructions closely.   It is given as a shot into the fatty part of the skin in the upper arm, thigh, buttocks, or stomach area.   If you will be giving yourself   the shot, your doctor or nurse will teach you how to give the shot.   Wash your hands before use.   If stored in a refrigerator, let this drug come to room temperature before using it. Leave it at room temperature for at least 30 minutes. Do not heat this drug.   Do not shake.   Do not give into skin that is irritated, bruised, red, infected, or scarred.   Do not give into skin within 2 inches of the belly button.   If your dose is more than 1 injection, you may give it in the same body part. Make sure you do not give injections in the same spot you used for the other injections.   Do not rub the site where you give the shot.   Do not  use if the solution is cloudy, leaking, or has particles.   This drug is colorless to slightly yellow or slightly brown. Do not use if the solution changes color.   Move the site where you give the shot with each shot.   If you drop this drug on a hard surface, do not use it.   Each prefilled pen or syringe is for one use only.   Throw away needles in a needle/sharp disposal box. Do not reuse needles or other items. When the box is full, follow all local rules for getting rid of it. Talk with a doctor or pharmacist if you have any questions.  What do I do if I miss a dose?   Take a missed dose as soon as you think about it.   After taking a missed dose, start a new schedule based on when the dose is taken.  How do I store and/or throw out this drug?   Store in a refrigerator. Do not freeze.   Store in the carton to protect from light.   If needed, you may store at room temperature for up to 7 days. Write down the date you take this drug out of the refrigerator. If stored at room temperature and not used within 7 days, throw this drug away.   Do not put this drug back in the refrigerator after it has been stored at room temperature.   Keep all drugs in a safe place. Keep all drugs out of the reach of children and pets.   Throw away unused or expired drugs. Do not flush down a toilet or pour down a drain unless you are told to do so. Check with your pharmacist if you have questions about the best way to throw out drugs. There may be drug take-back programs in your area.  General drug facts   If your symptoms or health problems do not get better or if they become worse, call your doctor.   Do not share your drugs with others and do not take anyone else's drugs.   Keep a list of all your drugs (prescription, natural products, vitamins, OTC) with you. Give this list to your doctor.   Talk with the doctor before starting any new drug, including prescription or OTC, natural products, or vitamins.    Some drugs may have another patient information leaflet. If you have any questions about this drug, please talk with your doctor, nurse, pharmacist, or other health care provider.   If you think there has been an overdose, call your poison control center or get medical care right away. Be ready to tell or show what was taken, how much, and when   it happened.   

## 2018-04-21 NOTE — Progress Notes (Signed)
[facility]    Provider:  Dr Lucia Gaskins Referring Provider: Kaleen Mask, * Primary Care Physician:  Kaleen Mask, MD  CC: Neck pain and numbness, migraines.  Interval history 04/21/2018: Migraines continue to be severe. Daily headaches. 15 migraine days a month. 4 can be severe and the rest are moderately severe. Last 24 hours. No aura. No medication overuse. Ongoing at this frequency for > year. Failed multiple medications. migraines last 24 - 72 hours. Migraines are unilateral, pounding/pulsating/throbbing, +photo/phonophobia, nausea, vomiting, severe, passes out due to the pain, movement makes it worse.   Tried and failed in the past: Topiramate, Gabapentin, Topiramate, Imitrex, Maxalt, zomig, zofran, flexeril, Zoloft, amitriptyline, sertraline, propranolol   Interval history 12/11/2016: She is having episodes of near-syncope. And she has left arm numbness and weakness which continues. She has a FHX of stroke in her mother in her 30s. She has neck pain. Her migraines have improved but she still gets headaches. The left arm numbness and weakness has been persistent and ongoing for several years, recently worsened with noticeable weakness in that arm. She has continued eye blurriness. With the pre-syncopal episodes vision in both eyes can be affected and decreased with complete vision loss bilaterally but no alteration of awareness or consciousness; she can hear things around her but has difficulty comprehending what is being said, no urination or tongue biting.   Migraine medications tried: Gabapentin, Topiramate, Imitrex, Maxalt, zomig, zofran, flexeril, Zoloft  Interval history 07/03/2016:  She has not been compliant on the medication. She did not take the topamax or the Triptan. Had a long discussion about compliance, restarting the medication. She says she is very motivated. Discussed side effects again, other choices, migraine management. Decided to restart  Topiramate as well as continue the maxalt and flexeril  Interval history 01/12/2016: Haven't seen her in a year. She has been to the ED multiple times. She has left-sided weakness and numbness with the headaches. Given cocktail of Decadron, Toradol, Reglan and Benadryl with improvement. Imitrex not working, will change to Zomig nasal spray. I ordered an MRi of the brain and cervical spine last year to follow abnormal white matter changes on MRI, she did not have it completed and I reminded her today she will have the brain done. She has had the migraine for 4 weeks now. It is every year around time. For 30 days she has had a migraine, she has had 2 good days. She wakes up with headaches. The hedaches are worsening with vision changes. She has multiple a week, can be severe, has had the last headache for 4 weeks.   She has weekly headaches 2-3 a week. She takes tylenol a lot. She has tension around the eye. Migraines ar eon the left. Lights bother her, make her feel dizzy. She also has bad migraines 1-2x a year but they can be severe and last 30 days. She has neck pain, tightness.  HPI: Michelle Rhodes is a 37 y.o. female here as a referral from Dr. Harlene Ramus for neck pain and numbness, migraines.  Migraines started at the age of 50. Migraines are worsening, always on the left side side, pulsating and throbbing, light sensitivity, sound sensitivity, 10/10 pain, they last all day and have lasted 28 days. She has to go into a dark room. She gets neck pain on the left side. She has tingling in the left hand which radiates up the arm. Migraines are once a year and can last a month. Last migraine 2  weeks ago for about 2 days, last extended migraine last July. Takes imitrex which doesn't work. She has been to the emergency room in the past but otherwise just sits in her room for 30 days. No aura.   Left arm: She has tingling of the left fingers. Happens sporadically. She wakes up with numbness in the hand all  the time. No grip weakness. She has tightness in the neck but no radicular symptoms.   Reviewed notes, labs and imaging from outside physicians, which showed:   IMPRESSIO MRI of the brain (personally reviewed images) : 1. Stable nonspecific white matter signal changes without enhancement in the anterior frontal lobe. Top differential considerations remain sequelae of migraines, demyelination, and trauma. See comparison for additional diagnostic considerations. 2. 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 Recommend 1 year follow up to document stability.  Review of Systems: Patient complains of symptoms per HPI as well as the following symptoms: Headache, numbness, weakness, passing out, anxiety, decreased energy. Pertinent negatives per HPI. All others negative.   Social History   Socioeconomic History  . Marital status: Single    Spouse name: Not on file  . Number of children: 1  . Years of education: 45  . Highest education level: Not on file  Occupational History  . Not on file  Social Needs  . Financial resource strain: Not on file  . Food insecurity:    Worry: Not on file    Inability: Not on file  . Transportation needs:    Medical: Not on file    Non-medical: Not on file  Tobacco Use  . Smoking status: Former Smoker    Packs/day: 0.25    Years: 10.00    Pack years: 2.50    Types: Cigarettes    Last attempt to quit: 11/11/2016    Years since quitting: 1.4  . Smokeless tobacco: Never Used  Substance and Sexual Activity  . Alcohol use: No  . Drug use: No  . Sexual activity: Yes    Birth control/protection: Surgical  Lifestyle  . Physical activity:    Days per week: Not on file    Minutes per session: Not on file  . Stress: Not on file  Relationships  . Social connections:    Talks on phone: Not on file    Gets together: Not on file    Attends religious service: Not on file    Active member of club or organization: Not on file    Attends  meetings of clubs or organizations: Not on file    Relationship status: Not on file  . Intimate partner violence:    Fear of current or ex partner: Not on file    Emotionally abused: Not on file    Physically abused: Not on file    Forced sexual activity: Not on file  Other Topics Concern  . Not on file  Social History Narrative   Lives at home with son.   Caffeine use: tea occasionally with dinner   Right handed    Family History  Problem Relation Age of Onset  . Hypertension Mother   . Stroke Mother   . Hypertension Father   . Migraines Neg Hx     Past Medical History:  Diagnosis Date  . Anxiety   . Asthma    as a child  . Migraine     Past Surgical History:  Procedure Laterality Date  . BREAST BIOPSY Left 2012   x 2   .  ENDOMETRIAL ABLATION N/A 01/06/2015   Procedure: ENDOMETRIAL ABLATION;  Surgeon: [place] Bing, MD;  Location: [facility];  Service: Gynecology;  Laterality: N/A;  . LAPAROSCOPIC BILATERAL SALPINGO OOPHERECTOMY Bilateral 01/06/2015   Procedure: LAPAROSCOPIC BILATERAL TUBAL LIGATION with FILSIE CLIPS ;  Surgeon: [place] Bing, MD;  Location: [facility];  Service: Gynecology;  Laterality: Bilateral;    Current Outpatient Medications  Medication Sig Dispense Refill  . cyclobenzaprine (FLEXERIL) 10 MG tablet Take 1 tablet (10 mg total) by mouth 3 (three) times daily as needed for muscle spasms. 30 tablet 11  . rizatriptan (MAXALT-MLT) 10 MG disintegrating tablet Take 1 tablet (10 mg total) by mouth once as needed for migraine. May repeat in 2 hours if needed 10 tablet 11  . Galcanezumab-gnlm (EMGALITY) 120 MG/ML SOAJ Inject 120 mg into the skin every 30 (thirty) days. 1 pen 11  . Galcanezumab-gnlm (EMGALITY) 120 MG/ML SOAJ Inject 240 mg into the skin once for 1 dose. [id number] 3/21 2 pen 0   No current facility-administered medications for this visit.     Allergies as of 04/21/2018 - Review Complete 04/21/2018  Allergen Reaction Noted  . Topiramate   04/21/2018    Vitals: BP 97/65 (BP Location: Right Arm, Patient Position: Sitting)   Pulse (!) 58   Ht 5\' 4"  (1.626 m)   Wt 135 lb (61.2 kg)   BMI 23.17 kg/m  Last Weight:  Wt Readings from Last 1 Encounters:  04/21/18 135 lb (61.2 kg)   Last Height:   Ht Readings from Last 1 Encounters:  04/21/18 5\' 4"  (1.626 m)   Physical exam: Exam: Gen: NAD, conversant, well nourised, thin , well groomed                     CV: RRR, no MRG. No Carotid Bruits. No peripheral edema, warm, nontender Eyes: Conjunctivae clear without exudates or hemorrhage  Neuro: Detailed Neurologic Exam  Speech:    Speech is normal; fluent and spontaneous with normal comprehension.  Cognition:    The patient is oriented to person, place, and time;     recent and remote memory intact;     language fluent;     normal attention, concentration,     fund of knowledge Cranial Nerves:    The pupils are equal, round, and reactive to light. The fundi are normal and spontaneous venous pulsations are present. Visual fields are full to finger confrontation. Extraocular movements are intact. Trigeminal sensation is intact and the muscles of mastication are normal. The face is symmetric. The palate elevates in the midline. Hearing intact. Voice is normal. Shoulder shrug is normal. The tongue has normal motion without fasciculations.   Coordination:    Normal finger to nose and heel to shin. Normal rapid alternating movements.   Gait:    Heel-toe and tandem gait are normal.   Motor Observation:    No asymmetry, no atrophy, and no involuntary movements noted. Tone:    Normal muscle tone.    Posture:    Posture is normal. normal erect    Strength: Left arm weakness 4/5. Otherwise strength is V/V in the upper and lower limbs.      Sensation: dec to pin prick left arm.      Reflex Exam:  DTR's:    Deep tendon reflexes in the upper and lower extremities are normal bilaterally.   Toes:    The toes are  downgoing bilaterally.   Clonus:    Clonus is absent.  Assessment/Plan: 37 year old female with a past medical history of migraines, abnormal white matter changes on last MRI in 2011, left arm and hand pain and paresthesias. Discussed below again  - Repeat MRIs of the brain to follow abnormal white matter changes and pineal cyst seen in 2011 have been stable: - As far as your medications are concerned, start Emgality - Maxalt at onset of migraine. . If it does not improve the symptoms please take one additional dose. Do not take more then 2 doses in 24hrs. Do not take use more then 2 to 3 times in a week  Tried and failed in the past: Topiramate, Gabapentin, Topiramate, Imitrex, Maxalt, zomig, zofran, flexeril, Zoloft, amitriptyline, sertraline, propranolol   Meds ordered this encounter  Medications  . Galcanezumab-gnlm (EMGALITY) 120 MG/ML SOAJ    Sig: Inject 120 mg into the skin every 30 (thirty) days.    Dispense:  1 pen    Refill:  11  . Galcanezumab-gnlm (EMGALITY) 120 MG/ML SOAJ    Sig: Inject 240 mg into the skin once for 1 dose. [id number] 3/21    Dispense:  2 pen    Refill:  0     Naomie Dean, MD  [facility] [address] [place], [place] [address]  Phone [phone] Fax [phone]  A total of 35 minutes was spent face-to-face with this patient. Over half this time was spent on counseling patient on the  1. Chronic migraine without aura, with intractable migraine, so stated, with status migrainosus    diagnosis and different diagnostic and therapeutic options available.

## 2018-08-13 HISTORY — PX: REDUCTION MAMMAPLASTY: SUR839

## 2018-08-27 ENCOUNTER — Other Ambulatory Visit: Payer: Self-pay | Admitting: Neurology

## 2018-08-27 MED ORDER — TOPIRAMATE 50 MG PO TABS: 50.0000 mg | ORAL_TABLET | Freq: Every day | ORAL | 4 refills | Status: DC

## 2018-08-28 ENCOUNTER — Telehealth: Payer: Self-pay | Admitting: Neurology

## 2018-08-28 NOTE — Telephone Encounter (Signed)
Patient would like to initiate Botox for migraines. Please call patient and start the process please thank you.

## 2018-08-29 NOTE — Telephone Encounter (Signed)
Patient will need consent at her first botox appointment.

## 2018-09-01 ENCOUNTER — Other Ambulatory Visit: Payer: Self-pay | Admitting: *Deleted

## 2018-09-01 MED ORDER — ONABOTULINUMTOXINA 100 UNITS IJ SOLR: INTRAMUSCULAR | 3 refills | Status: DC

## 2018-09-01 NOTE — Telephone Encounter (Signed)
I called and spoke with the patient regarding the Botox. We schedule an apt.   Toma Copier, would you send a script over to [facility] in [place]?

## 2018-09-01 NOTE — Telephone Encounter (Signed)
Done

## 2018-09-02 NOTE — Telephone Encounter (Signed)
Noted, thank you

## 2018-09-18 NOTE — Telephone Encounter (Signed)
[id number] (12/31/18)

## 2018-09-18 NOTE — Telephone Encounter (Signed)
I called [facility] at [phone] to schedule delivery. They are verifying the script within the hour and it will be pending patient consent. I called the patient to make her aware she will needs to call the pharmacy. If she calls back please ask her to call [facility] at [phone].

## 2018-09-18 NOTE — Telephone Encounter (Signed)
Pt returned Danielle's call. Msg relayed, phone # given, she will call [facility].

## 2018-09-18 NOTE — Telephone Encounter (Signed)
Noted, thank you

## 2018-09-19 ENCOUNTER — Ambulatory Visit (INDEPENDENT_AMBULATORY_CARE_PROVIDER_SITE_OTHER): Payer: Medicaid Other | Admitting: Neurology

## 2018-09-19 DIAGNOSIS — G43711 Chronic migraine without aura, intractable, with status migrainosus: Secondary | ICD-10-CM

## 2018-09-19 NOTE — Progress Notes (Signed)
Botox- 100 units x 2 vials Lot: [id number] Expiration: 01/2021 NDC: 2094-7096-28  Bacteriostatic 0.9% Sodium Chloride- 33mL total Lot: [id number] Expiration: 05/14/2019 NDC: 7654-6503-54  Dx:G43.711  S/P

## 2018-09-19 NOTE — Progress Notes (Signed)
Consent Form Botulism Toxin Injection For Chronic Migraine   09/19/2018: Today is our first botox. At baseline, she has Daily headaches. 15 migraine days a month. +all.   Reviewed orally with patient, additionally signature is on file:  Botulism toxin has been approved by the Federal drug administration for treatment of chronic migraine. Botulism toxin does not cure chronic migraine and it may not be effective in some patients.  The administration of botulism toxin is accomplished by injecting a small amount of toxin into the muscles of the neck and head. Dosage must be titrated for each individual. Any benefits resulting from botulism toxin tend to wear off after 3 months with a repeat injection required if benefit is to be maintained. Injections are usually done every 3-4 months with maximum effect peak achieved by about 2 or 3 weeks. Botulism toxin is expensive and you should be sure of what costs you will incur resulting from the injection.  The side effects of botulism toxin use for chronic migraine may include:   -Transient, and usually mild, facial weakness with facial injections  -Transient, and usually mild, head or neck weakness with head/neck injections  -Reduction or loss of forehead facial animation due to forehead muscle weakness  -Eyelid drooping  -Dry eye  -Pain at the site of injection or bruising at the site of injection  -Double vision  -Potential unknown long term risks  Contraindications: You should not have Botox if you are pregnant, nursing, allergic to albumin, have an infection, skin condition, or muscle weakness at the site of the injection, or have myasthenia gravis, Lambert-Eaton syndrome, or ALS.  It is also possible that as with any injection, there may be an allergic reaction or no effect from the medication. Reduced effectiveness after repeated injections is sometimes seen and rarely infection at the injection site may occur. All care will be taken to  prevent these side effects. If therapy is given over a long time, atrophy and wasting in the muscle injected may occur. Occasionally the patient's become refractory to treatment because they develop antibodies to the toxin. In this event, therapy needs to be modified.  I have read the above information and consent to the administration of botulism toxin.    BOTOX PROCEDURE NOTE FOR MIGRAINE HEADACHE    Contraindications and precautions discussed with patient(above). Aseptic procedure was observed and patient tolerated procedure. Procedure performed by Dr. Artemio Aly  The condition has existed for more than 6 months, and pt does not have a diagnosis of ALS, Myasthenia Gravis or Lambert-Eaton Syndrome.  Risks and benefits of injections discussed and pt agrees to proceed with the procedure.  Written consent obtained  These injections are medically necessary. Pt  receives good benefits from these injections. These injections do not cause sedations or hallucinations which the oral therapies may cause.  Description of procedure:  The patient was placed in a sitting position. The standard protocol was used for Botox as follows, with 5 units of Botox injected at each site:   -Procerus muscle, midline injection  -Corrugator muscle, bilateral injection  -Frontalis muscle, bilateral injection, with 2 sites each side, medial injection was performed in the upper one third of the frontalis muscle, in the region vertical from the medial inferior edge of the superior orbital rim. The lateral injection was again in the upper one third of the forehead vertically above the lateral limbus of the cornea, 1.5 cm lateral to the medial injection site.  - Levator Scapulae: 5 units bilaterally  -  Temporalis muscle injection, 5 sites, bilaterally. The first injection was 3 cm above the tragus of the ear, second injection site was 1.5 cm to 3 cm up from the first injection site in line with the tragus of the ear.  The third injection site was 1.5-3 cm forward between the first 2 injection sites. The fourth injection site was 1.5 cm posterior to the second injection site. 5th site laterally in the temporalis  muscleat the level of the outer canthus.  - Patient feels her clenching is a trigger for headaches. +5 units masseter bilaterally   - Patient feels the migraines are centered around the eyes +5 units bilaterally at the outer canthus in the orbicularis occuli  -Occipitalis muscle injection, 3 sites, bilaterally. The first injection was done one half way between the occipital protuberance and the tip of the mastoid process behind the ear. The second injection site was done lateral and superior to the first, 1 fingerbreadth from the first injection. The third injection site was 1 fingerbreadth superiorly and medially from the first injection site.  -Cervical paraspinal muscle injection, 2 sites, bilateral knee first injection site was 1 cm from the midline of the cervical spine, 3 cm inferior to the lower border of the occipital protuberance. The second injection site was 1.5 cm superiorly and laterally to the first injection site.  -Trapezius muscle injection was performed at 3 sites, bilaterally. The first injection site was in the upper trapezius muscle halfway between the inflection point of the neck, and the acromion. The second injection site was one half way between the acromion and the first injection site. The third injection was done between the first injection site and the inflection point of the neck.   Will return for repeat injection in 3 months.   A 200 unit sof Botox was used, any Botox not injected was wasted. The patient tolerated the procedure well, there were no complications of the above procedure.

## 2018-12-19 ENCOUNTER — Ambulatory Visit: Payer: Medicaid Other | Admitting: Neurology

## 2018-12-23 ENCOUNTER — Telehealth: Payer: Self-pay | Admitting: Neurology

## 2018-12-23 NOTE — Telephone Encounter (Signed)
I called the patient to schedule her Botox apt but she did not answer so I left a VM asking her to call back. Medication is here. DW

## 2018-12-26 NOTE — Telephone Encounter (Signed)
Pt has a conflict in schedule, she is asking to be called to r/s her Botox appointment

## 2018-12-29 ENCOUNTER — Other Ambulatory Visit: Payer: Self-pay | Admitting: Obstetrics & Gynecology

## 2018-12-29 ENCOUNTER — Ambulatory Visit (INDEPENDENT_AMBULATORY_CARE_PROVIDER_SITE_OTHER): Payer: Medicaid Other

## 2018-12-29 ENCOUNTER — Telehealth: Payer: Self-pay

## 2018-12-29 ENCOUNTER — Other Ambulatory Visit: Payer: Self-pay

## 2018-12-29 DIAGNOSIS — R300 Dysuria: Secondary | ICD-10-CM | POA: Diagnosis not present

## 2018-12-29 LAB — POCT URINALYSIS DIPSTICK
Bilirubin, UA: NEGATIVE
Blood, UA: POSITIVE
Glucose, UA: NEGATIVE
Ketones, UA: NEGATIVE
Nitrite, UA: NEGATIVE
Protein, UA: NEGATIVE
Spec Grav, UA: 1.01 (ref 1.010–1.025)
Urobilinogen, UA: 0.2 E.U./dL
pH, UA: 5 (ref 5.0–8.0)

## 2018-12-29 MED ORDER — SULFAMETHOXAZOLE-TRIMETHOPRIM 800-160 MG PO TABS: 1.0000 | ORAL_TABLET | Freq: Two times a day (BID) | ORAL | 0 refills | Status: AC

## 2018-12-29 NOTE — Telephone Encounter (Signed)
Pt dropped of urine sample, having symptoms of frequency and pain, urinalysis shows small leukocytes and blood can you put in order for urine culture? Do we need to wait for results for rx or can you send in something ?

## 2018-12-29 NOTE — Telephone Encounter (Signed)
Pt aware.

## 2018-12-29 NOTE — Telephone Encounter (Signed)
Rx called in Culture ordered Pt needs annual/PAP as it has been 2 years since she has been seen here

## 2018-12-29 NOTE — Telephone Encounter (Signed)
I called and rs the patients apt. DW

## 2018-12-30 ENCOUNTER — Ambulatory Visit: Payer: Medicaid Other | Admitting: Neurology

## 2019-01-01 ENCOUNTER — Telehealth: Payer: Self-pay

## 2019-01-01 LAB — URINE CULTURE

## 2019-01-01 NOTE — Telephone Encounter (Signed)
Pt calling for urine results , pt aware they are not back

## 2019-01-14 ENCOUNTER — Other Ambulatory Visit: Payer: Self-pay

## 2019-01-14 ENCOUNTER — Telehealth: Payer: Self-pay | Admitting: Neurology

## 2019-01-14 ENCOUNTER — Ambulatory Visit (INDEPENDENT_AMBULATORY_CARE_PROVIDER_SITE_OTHER): Payer: Medicaid Other | Admitting: Neurology

## 2019-01-14 VITALS — Temp 98.2°F

## 2019-01-14 DIAGNOSIS — G43711 Chronic migraine without aura, intractable, with status migrainosus: Secondary | ICD-10-CM | POA: Diagnosis not present

## 2019-01-14 MED ORDER — TOPIRAMATE 100 MG PO TABS: 100.0000 mg | ORAL_TABLET | Freq: Every day | ORAL | 6 refills | Status: DC

## 2019-01-14 NOTE — Progress Notes (Signed)
Botox- 100 units x 2 vials Lot: [id number] Expiration: 12/2020 NDC: 0023-1145-01  Bacteriostatic 0.9% Sodium Chloride- 4mL total Lot: [id number] Expiration: 05/14/2019 NDC: 0409-1966-02  Dx: G43.711 S/P  

## 2019-01-14 NOTE — Progress Notes (Signed)
Consent Form Botulism Toxin Injection For Chronic Migraine   6/3/202: Today is our first botox. At baseline, she has Daily headaches. 15 migraine days a month. +all. Over half the month headache free. She has not had one migraine. She is getting breast surgery, Dr. Georgeanne Nim next week.     Reviewed orally with patient, additionally signature is on file:  Botulism toxin has been approved by the Federal drug administration for treatment of chronic migraine. Botulism toxin does not cure chronic migraine and it may not be effective in some patients.  The administration of botulism toxin is accomplished by injecting a small amount of toxin into the muscles of the neck and head. Dosage must be titrated for each individual. Any benefits resulting from botulism toxin tend to wear off after 3 months with a repeat injection required if benefit is to be maintained. Injections are usually done every 3-4 months with maximum effect peak achieved by about 2 or 3 weeks. Botulism toxin is expensive and you should be sure of what costs you will incur resulting from the injection.  The side effects of botulism toxin use for chronic migraine may include:   -Transient, and usually mild, facial weakness with facial injections  -Transient, and usually mild, head or neck weakness with head/neck injections  -Reduction or loss of forehead facial animation due to forehead muscle weakness  -Eyelid drooping  -Dry eye  -Pain at the site of injection or bruising at the site of injection  -Double vision  -Potential unknown long term risks  Contraindications: You should not have Botox if you are pregnant, nursing, allergic to albumin, have an infection, skin condition, or muscle weakness at the site of the injection, or have myasthenia gravis, Lambert-Eaton syndrome, or ALS.  It is also possible that as with any injection, there may be an allergic reaction or no effect from the medication. Reduced effectiveness after  repeated injections is sometimes seen and rarely infection at the injection site may occur. All care will be taken to prevent these side effects. If therapy is given over a long time, atrophy and wasting in the muscle injected may occur. Occasionally the patient's become refractory to treatment because they develop antibodies to the toxin. In this event, therapy needs to be modified.  I have read the above information and consent to the administration of botulism toxin.    BOTOX PROCEDURE NOTE FOR MIGRAINE HEADACHE    Contraindications and precautions discussed with patient(above). Aseptic procedure was observed and patient tolerated procedure. Procedure performed by Dr. Artemio Aly  The condition has existed for more than 6 months, and pt does not have a diagnosis of ALS, Myasthenia Gravis or Lambert-Eaton Syndrome.  Risks and benefits of injections discussed and pt agrees to proceed with the procedure.  Written consent obtained  These injections are medically necessary. Pt  receives good benefits from these injections. These injections do not cause sedations or hallucinations which the oral therapies may cause.  Description of procedure:  The patient was placed in a sitting position. The standard protocol was used for Botox as follows, with 5 units of Botox injected at each site:   -Procerus muscle, midline injection  -Corrugator muscle, bilateral injection  -Frontalis muscle, bilateral injection, with 2 sites each side, medial injection was performed in the upper one third of the frontalis muscle, in the region vertical from the medial inferior edge of the superior orbital rim. The lateral injection was again in the upper one third of the forehead  vertically above the lateral limbus of the cornea, 1.5 cm lateral to the medial injection site.  - Levator Scapulae: 5 units bilaterally  -Temporalis muscle injection, 5 sites, bilaterally. The first injection was 3 cm above the tragus of the  ear, second injection site was 1.5 cm to 3 cm up from the first injection site in line with the tragus of the ear. The third injection site was 1.5-3 cm forward between the first 2 injection sites. The fourth injection site was 1.5 cm posterior to the second injection site. 5th site laterally in the temporalis  muscleat the level of the outer canthus.  - Patient feels her clenching is a trigger for headaches. +5 units masseter bilaterally   - Patient feels the migraines are centered around the eyes +5 units bilaterally at the outer canthus in the orbicularis occuli  -Occipitalis muscle injection, 3 sites, bilaterally. The first injection was done one half way between the occipital protuberance and the tip of the mastoid process behind the ear. The second injection site was done lateral and superior to the first, 1 fingerbreadth from the first injection. The third injection site was 1 fingerbreadth superiorly and medially from the first injection site.  -Cervical paraspinal muscle injection, 2 sites, bilateral knee first injection site was 1 cm from the midline of the cervical spine, 3 cm inferior to the lower border of the occipital protuberance. The second injection site was 1.5 cm superiorly and laterally to the first injection site.  -Trapezius muscle injection was performed at 3 sites, bilaterally. The first injection site was in the upper trapezius muscle halfway between the inflection point of the neck, and the acromion. The second injection site was one half way between the acromion and the first injection site. The third injection was done between the first injection site and the inflection point of the neck.   Will return for repeat injection in 3 months.   A 200 unit sof Botox was used, any Botox not injected was wasted. The patient tolerated the procedure well, there were no complications of the above procedure.

## 2019-01-14 NOTE — Telephone Encounter (Signed)
3 mo Botox inj  °

## 2019-01-20 NOTE — Telephone Encounter (Signed)
I called and spoke with the patient and scheduled Botox injections. DW

## 2019-01-22 ENCOUNTER — Telehealth: Payer: Self-pay

## 2019-01-22 NOTE — Telephone Encounter (Signed)
Pt called to find out what medication she was on for the uti

## 2019-01-29 ENCOUNTER — Other Ambulatory Visit: Payer: Self-pay | Admitting: Plastic Surgery

## 2019-04-13 ENCOUNTER — Telehealth: Payer: Self-pay | Admitting: Neurology

## 2019-04-13 NOTE — Telephone Encounter (Signed)
I called the patient inform her that Medicaid denied the initial Botox request and wants more clinicals sent in. We are processing this today but would like to switch her yearly follow up and Botox apt. So she will come for Botox on the 14th. She did not answer so I left a VM asking her to call me back. DW

## 2019-04-14 ENCOUNTER — Ambulatory Visit: Payer: Medicaid Other | Admitting: Neurology

## 2019-04-16 ENCOUNTER — Ambulatory Visit: Payer: Medicaid Other | Admitting: Neurology

## 2019-04-27 ENCOUNTER — Ambulatory Visit: Payer: Medicaid Other | Admitting: Neurology

## 2019-05-05 ENCOUNTER — Other Ambulatory Visit: Payer: Self-pay

## 2019-05-05 ENCOUNTER — Ambulatory Visit (INDEPENDENT_AMBULATORY_CARE_PROVIDER_SITE_OTHER): Payer: Medicaid Other | Admitting: Neurology

## 2019-05-05 ENCOUNTER — Encounter: Payer: Self-pay | Admitting: *Deleted

## 2019-05-05 DIAGNOSIS — G43711 Chronic migraine without aura, intractable, with status migrainosus: Secondary | ICD-10-CM | POA: Diagnosis not present

## 2019-05-05 NOTE — Progress Notes (Signed)
Botox- 100 units x 2 vials Lot: [id number] Expiration: 10/2021 NDC: 6168-3729-02  Bacteriostatic 0.9% Sodium Chloride- 19mL total Lot: [id number] Expiration: 05/14/2019 NDC: 0802-2336-12  Dx: A44.975 S/P

## 2019-05-05 NOTE — Progress Notes (Signed)
Consent Form Botulism Toxin Injection For Chronic Migraine   922/2020: great success. She had 100% improvement but the last few weeks we are overdue and she has started to have them again. Excellent response.   6/3/202: Today is our first botox. At baseline, she has Daily headaches. 15 migraine days a month. +all. Over half the month headache free. She has not had one migraine. She is getting breast surgery, Dr. Georgeanne Nim next week.     Reviewed orally with patient, additionally signature is on file:  Botulism toxin has been approved by the Federal drug administration for treatment of chronic migraine. Botulism toxin does not cure chronic migraine and it may not be effective in some patients.  The administration of botulism toxin is accomplished by injecting a small amount of toxin into the muscles of the neck and head. Dosage must be titrated for each individual. Any benefits resulting from botulism toxin tend to wear off after 3 months with a repeat injection required if benefit is to be maintained. Injections are usually done every 3-4 months with maximum effect peak achieved by about 2 or 3 weeks. Botulism toxin is expensive and you should be sure of what costs you will incur resulting from the injection.  The side effects of botulism toxin use for chronic migraine may include:   -Transient, and usually mild, facial weakness with facial injections  -Transient, and usually mild, head or neck weakness with head/neck injections  -Reduction or loss of forehead facial animation due to forehead muscle weakness  -Eyelid drooping  -Dry eye  -Pain at the site of injection or bruising at the site of injection  -Double vision  -Potential unknown long term risks  Contraindications: You should not have Botox if you are pregnant, nursing, allergic to albumin, have an infection, skin condition, or muscle weakness at the site of the injection, or have myasthenia gravis, Lambert-Eaton syndrome, or  ALS.  It is also possible that as with any injection, there may be an allergic reaction or no effect from the medication. Reduced effectiveness after repeated injections is sometimes seen and rarely infection at the injection site may occur. All care will be taken to prevent these side effects. If therapy is given over a long time, atrophy and wasting in the muscle injected may occur. Occasionally the patient's become refractory to treatment because they develop antibodies to the toxin. In this event, therapy needs to be modified.  I have read the above information and consent to the administration of botulism toxin.    BOTOX PROCEDURE NOTE FOR MIGRAINE HEADACHE    Contraindications and precautions discussed with patient(above). Aseptic procedure was observed and patient tolerated procedure. Procedure performed by Dr. Artemio Aly  The condition has existed for more than 6 months, and pt does not have a diagnosis of ALS, Myasthenia Gravis or Lambert-Eaton Syndrome.  Risks and benefits of injections discussed and pt agrees to proceed with the procedure.  Written consent obtained  These injections are medically necessary. Pt  receives good benefits from these injections. These injections do not cause sedations or hallucinations which the oral therapies may cause.  Description of procedure:  The patient was placed in a sitting position. The standard protocol was used for Botox as follows, with 5 units of Botox injected at each site:   -Procerus muscle, midline injection  -Corrugator muscle, bilateral injection  -Frontalis muscle, bilateral injection, with 2 sites each side, medial injection was performed in the upper one third of the frontalis muscle,  in the region vertical from the medial inferior edge of the superior orbital rim. The lateral injection was again in the upper one third of the forehead vertically above the lateral limbus of the cornea, 1.5 cm lateral to the medial injection  site.  - Levator Scapulae: 5 units bilaterally  -Temporalis muscle injection, 5 sites, bilaterally. The first injection was 3 cm above the tragus of the ear, second injection site was 1.5 cm to 3 cm up from the first injection site in line with the tragus of the ear. The third injection site was 1.5-3 cm forward between the first 2 injection sites. The fourth injection site was 1.5 cm posterior to the second injection site. 5th site laterally in the temporalis  muscleat the level of the outer canthus.  - Patient feels her clenching is a trigger for headaches. +5 units masseter bilaterally   - Patient feels the migraines are centered around the eyes +5 units bilaterally at the outer canthus in the orbicularis occuli  -Occipitalis muscle injection, 3 sites, bilaterally. The first injection was done one half way between the occipital protuberance and the tip of the mastoid process behind the ear. The second injection site was done lateral and superior to the first, 1 fingerbreadth from the first injection. The third injection site was 1 fingerbreadth superiorly and medially from the first injection site.  -Cervical paraspinal muscle injection, 2 sites, bilateral knee first injection site was 1 cm from the midline of the cervical spine, 3 cm inferior to the lower border of the occipital protuberance. The second injection site was 1.5 cm superiorly and laterally to the first injection site.  -Trapezius muscle injection was performed at 3 sites, bilaterally. The first injection site was in the upper trapezius muscle halfway between the inflection point of the neck, and the acromion. The second injection site was one half way between the acromion and the first injection site. The third injection was done between the first injection site and the inflection point of the neck.   Will return for repeat injection in 3 months.   A 200 unit sof Botox was used, any Botox not injected was wasted. The patient  tolerated the procedure well, there were no complications of the above procedure.

## 2019-05-25 ENCOUNTER — Other Ambulatory Visit: Payer: Self-pay | Admitting: Neurology

## 2019-05-25 ENCOUNTER — Telehealth: Payer: Self-pay | Admitting: Neurology

## 2019-05-25 DIAGNOSIS — H539 Unspecified visual disturbance: Secondary | ICD-10-CM

## 2019-05-25 DIAGNOSIS — R51 Headache with orthostatic component, not elsewhere classified: Secondary | ICD-10-CM

## 2019-05-25 DIAGNOSIS — R519 Headache, unspecified: Secondary | ICD-10-CM

## 2019-05-25 DIAGNOSIS — G441 Vascular headache, not elsewhere classified: Secondary | ICD-10-CM

## 2019-05-25 DIAGNOSIS — G4453 Primary thunderclap headache: Secondary | ICD-10-CM

## 2019-05-25 NOTE — Telephone Encounter (Signed)
medicaid order sent to [facility]. They will obtain the auth and reach out to the patient to schedule.  °

## 2019-05-31 ENCOUNTER — Other Ambulatory Visit: Payer: Self-pay | Admitting: Neurology

## 2019-05-31 MED ORDER — TIZANIDINE HCL 4 MG PO TABS: 4.0000 mg | ORAL_TABLET | Freq: Four times a day (QID) | ORAL | 3 refills | Status: DC | PRN

## 2019-06-04 ENCOUNTER — Other Ambulatory Visit: Payer: Self-pay

## 2019-06-04 ENCOUNTER — Ambulatory Visit: Payer: Self-pay | Admitting: Neurology

## 2019-06-04 VITALS — Temp 98.2°F

## 2019-06-04 DIAGNOSIS — G43711 Chronic migraine without aura, intractable, with status migrainosus: Secondary | ICD-10-CM

## 2019-06-08 NOTE — Progress Notes (Signed)
No charge, evaluation after botox for migraines. +10U each masseter a,d 20+ unites each trapezius.

## 2019-06-11 ENCOUNTER — Other Ambulatory Visit: Payer: Self-pay

## 2019-06-11 ENCOUNTER — Ambulatory Visit
Admission: RE | Admit: 2019-06-11 | Discharge: 2019-06-11 | Disposition: A | Discharge status: Home / Self Care | Payer: Medicaid Other | Source: Ambulatory Visit | Attending: Neurology | Admitting: Neurology

## 2019-06-11 DIAGNOSIS — H539 Unspecified visual disturbance: Secondary | ICD-10-CM

## 2019-06-11 DIAGNOSIS — G441 Vascular headache, not elsewhere classified: Secondary | ICD-10-CM

## 2019-06-11 DIAGNOSIS — R519 Headache, unspecified: Secondary | ICD-10-CM

## 2019-06-11 DIAGNOSIS — G4453 Primary thunderclap headache: Secondary | ICD-10-CM

## 2019-06-11 DIAGNOSIS — R51 Headache with orthostatic component, not elsewhere classified: Secondary | ICD-10-CM

## 2019-06-11 MED ORDER — GADOBENATE DIMEGLUMINE 529 MG/ML IV SOLN
12.0000 mL | Freq: Once | INTRAVENOUS | Status: AC | PRN
  Administered 2019-06-11: 12 mL via INTRAVENOUS

## 2019-08-11 ENCOUNTER — Other Ambulatory Visit: Payer: Self-pay

## 2019-08-11 ENCOUNTER — Ambulatory Visit: Payer: Medicaid Other | Admitting: Neurology

## 2019-08-11 VITALS — Temp 97.8°F

## 2019-08-11 DIAGNOSIS — G43711 Chronic migraine without aura, intractable, with status migrainosus: Secondary | ICD-10-CM

## 2019-08-11 NOTE — Progress Notes (Signed)
Botox- 100 units x 2 vials Lot: [id number] Expiration: 04/2022 NDC: 0023-1145-01  Bacteriostatic 0.9% Sodium Chloride- 4mL total Lot: [id number] Expiration: 11/12/2019 NDC: 0409-1966-02  Dx: G43.711 S/P   

## 2019-08-11 NOTE — Progress Notes (Signed)
Consent Form Botulism Toxin Injection For Chronic Migraine  08/11/2019: She has great success, 99% improvement in frequency. +a. Increase 10 units in the masseters each and extra in the traps. Reviewed mri images today as well.    922/2020: great success. She had 100% improvement but the last few weeks we are overdue and she has started to have them again. Excellent response.   6/3/202: Today is our first botox. At baseline, she has Daily headaches. 15 migraine days a month. +all. Over half the month headache free. She has not had one migraine. She is getting breast surgery, Dr. Georgeanne Nim next week.     Reviewed orally with patient, additionally signature is on file:  Botulism toxin has been approved by the Federal drug administration for treatment of chronic migraine. Botulism toxin does not cure chronic migraine and it may not be effective in some patients.  The administration of botulism toxin is accomplished by injecting a small amount of toxin into the muscles of the neck and head. Dosage must be titrated for each individual. Any benefits resulting from botulism toxin tend to wear off after 3 months with a repeat injection required if benefit is to be maintained. Injections are usually done every 3-4 months with maximum effect peak achieved by about 2 or 3 weeks. Botulism toxin is expensive and you should be sure of what costs you will incur resulting from the injection.  The side effects of botulism toxin use for chronic migraine may include:   -Transient, and usually mild, facial weakness with facial injections  -Transient, and usually mild, head or neck weakness with head/neck injections  -Reduction or loss of forehead facial animation due to forehead muscle weakness  -Eyelid drooping  -Dry eye  -Pain at the site of injection or bruising at the site of injection  -Double vision  -Potential unknown long term risks  Contraindications: You should not have Botox if you are  pregnant, nursing, allergic to albumin, have an infection, skin condition, or muscle weakness at the site of the injection, or have myasthenia gravis, Lambert-Eaton syndrome, or ALS.  It is also possible that as with any injection, there may be an allergic reaction or no effect from the medication. Reduced effectiveness after repeated injections is sometimes seen and rarely infection at the injection site may occur. All care will be taken to prevent these side effects. If therapy is given over a long time, atrophy and wasting in the muscle injected may occur. Occasionally the patient's become refractory to treatment because they develop antibodies to the toxin. In this event, therapy needs to be modified.  I have read the above information and consent to the administration of botulism toxin.    BOTOX PROCEDURE NOTE FOR MIGRAINE HEADACHE    Contraindications and precautions discussed with patient(above). Aseptic procedure was observed and patient tolerated procedure. Procedure performed by Dr. Artemio Aly  The condition has existed for more than 6 months, and pt does not have a diagnosis of ALS, Myasthenia Gravis or Lambert-Eaton Syndrome.  Risks and benefits of injections discussed and pt agrees to proceed with the procedure.  Written consent obtained  These injections are medically necessary. Pt  receives good benefits from these injections. These injections do not cause sedations or hallucinations which the oral therapies may cause.  Description of procedure:  The patient was placed in a sitting position. The standard protocol was used for Botox as follows, with 5 units of Botox injected at each site:   -Procerus muscle,  midline injection  -Corrugator muscle, bilateral injection  -Frontalis muscle, bilateral injection, with 2 sites each side, medial injection was performed in the upper one third of the frontalis muscle, in the region vertical from the medial inferior edge of the superior  orbital rim. The lateral injection was again in the upper one third of the forehead vertically above the lateral limbus of the cornea, 1.5 cm lateral to the medial injection site.  - Levator Scapulae: 5 units bilaterally  -Temporalis muscle injection, 5 sites, bilaterally. The first injection was 3 cm above the tragus of the ear, second injection site was 1.5 cm to 3 cm up from the first injection site in line with the tragus of the ear. The third injection site was 1.5-3 cm forward between the first 2 injection sites. The fourth injection site was 1.5 cm posterior to the second injection site. 5th site laterally in the temporalis  muscleat the level of the outer canthus.  - Patient feels her clenching is a trigger for headaches. +5 units masseter bilaterally   - Patient feels the migraines are centered around the eyes +5 units bilaterally at the outer canthus in the orbicularis occuli  -Occipitalis muscle injection, 3 sites, bilaterally. The first injection was done one half way between the occipital protuberance and the tip of the mastoid process behind the ear. The second injection site was done lateral and superior to the first, 1 fingerbreadth from the first injection. The third injection site was 1 fingerbreadth superiorly and medially from the first injection site.  -Cervical paraspinal muscle injection, 2 sites, bilateral knee first injection site was 1 cm from the midline of the cervical spine, 3 cm inferior to the lower border of the occipital protuberance. The second injection site was 1.5 cm superiorly and laterally to the first injection site.  -Trapezius muscle injection was performed at 3 sites, bilaterally. The first injection site was in the upper trapezius muscle halfway between the inflection point of the neck, and the acromion. The second injection site was one half way between the acromion and the first injection site. The third injection was done between the first injection site  and the inflection point of the neck.   Will return for repeat injection in 3 months.   A 200 unit sof Botox was used, any Botox not injected was wasted. The patient tolerated the procedure well, there were no complications of the above procedure.

## 2019-09-09 ENCOUNTER — Other Ambulatory Visit (HOSPITAL_COMMUNITY)
Admission: RE | Admit: 2019-09-09 | Discharge: 2019-09-09 | Disposition: A | Discharge status: Home / Self Care | Payer: Medicaid Other | Source: Ambulatory Visit | Attending: Obstetrics & Gynecology | Admitting: Obstetrics & Gynecology

## 2019-09-09 ENCOUNTER — Encounter: Payer: Self-pay | Admitting: Obstetrics & Gynecology

## 2019-09-09 ENCOUNTER — Ambulatory Visit (INDEPENDENT_AMBULATORY_CARE_PROVIDER_SITE_OTHER): Payer: Medicaid Other | Admitting: Obstetrics & Gynecology

## 2019-09-09 ENCOUNTER — Other Ambulatory Visit: Payer: Self-pay

## 2019-09-09 VITALS — BP 100/60 | Ht 64.0 in | Wt 145.0 lb

## 2019-09-09 DIAGNOSIS — Z124 Encounter for screening for malignant neoplasm of cervix: Secondary | ICD-10-CM

## 2019-09-09 DIAGNOSIS — Z0000 Encounter for general adult medical examination without abnormal findings: Secondary | ICD-10-CM | POA: Diagnosis not present

## 2019-09-09 DIAGNOSIS — Z01419 Encounter for gynecological examination (general) (routine) without abnormal findings: Secondary | ICD-10-CM

## 2019-09-09 DIAGNOSIS — N3281 Overactive bladder: Secondary | ICD-10-CM

## 2019-09-09 MED ORDER — MIRABEGRON ER 25 MG PO TB24: 25.0000 mg | ORAL_TABLET | Freq: Every day | ORAL | 3 refills | Status: DC

## 2019-09-09 NOTE — Patient Instructions (Signed)
Mirabegron extended-release tablets What is this medicine? MIRABEGRON (MIR a BEG ron) is used to treat overactive bladder. This medicine reduces the amount of bathroom visits. It may also help to control wetting accidents. It may be used alone, but sometimes may be given with other treatments. This medicine may be used for other purposes; ask your health care provider or pharmacist if you have questions. COMMON BRAND NAME(S): Myrbetriq What should I tell my health care provider before I take this medicine? They need to know if you have any of these conditions:  high blood pressure  kidney disease  liver disease  problems urinating  prostate disease  an unusual or allergic reaction to mirabegron, other medicines, foods, dyes, or preservatives  pregnant or trying to get pregnant  breast-feeding How should I use this medicine? Take this medicine by mouth with a glass of water. Follow the directions on the prescription label. Do not cut, crush or chew this medicine. You can take it with or without food. If it upsets your stomach, take it with food. Take your medicine at regular intervals. Do not take it more often than directed. Do not stop taking except on your doctor's advice. Talk to your pediatrician regarding the use of this medicine in children. Special care may be needed. Overdosage: If you think you have taken too much of this medicine contact a poison control center or emergency room at once. NOTE: This medicine is only for you. Do not share this medicine with others. What if I miss a dose? If you miss a dose, take it as soon as you can. If it is almost time for your next dose, take only that dose. Do not take double or extra doses. What may interact with this medicine?  codeine  desipramine  digoxin  flecainide  MAOIs like Carbex, Eldepryl, Marplan, Nardil, and Parnate  methadone  metoprolol  pimozide  propafenone  thioridazine  warfarin This list may not  describe all possible interactions. Give your health care provider a list of all the medicines, herbs, non-prescription drugs, or dietary supplements you use. Also tell them if you smoke, drink alcohol, or use illegal drugs. Some items may interact with your medicine. What should I watch for while using this medicine? Visit your doctor or health care professional for regular checks on your progress. Check your blood pressure as directed. Ask your doctor or health care professional what your blood pressure should be and when you should contact him or her. You may need to limit your intake of tea, coffee, caffeinated sodas, or alcohol. These drinks may make your symptoms worse. What side effects may I notice from receiving this medicine? Side effects that you should report to your doctor or health care professional as soon as possible:  allergic reactions like skin rash, itching or hives, swelling of the face, lips, or tongue  high blood pressure  fast, irregular heartbeat  redness, blistering, peeling or loosening of the skin, including inside the mouth  signs of infection like fever or chills; pain or difficulty passing urine  trouble passing urine or change in the amount of urine Side effects that usually do not require medical attention (report to your doctor or health care professional if they continue or are bothersome):  constipation  dry mouth  headache  runny nose  stomach upset This list may not describe all possible side effects. Call your doctor for medical advice about side effects. You may report side effects to FDA at 1-800-FDA-1088. Where should   I keep my medicine? Keep out of the reach of children. Store at room temperature between 15 and 30 degrees C (59 and 86 degrees F). Throw away any unused medicine after the expiration date. NOTE: This sheet is a summary. It may not cover all possible information. If you have questions about this medicine, talk to your doctor,  pharmacist, or health care provider.  2020 Elsevier/Gold Standard (2016-12-20 11:33:21)  

## 2019-09-09 NOTE — Progress Notes (Signed)
HPI:      [name] is a 39 y.o. [phone] who LMP was No LMP recorded. Patient has had an ablation., she presents today for her annual examination. The patient has no complaints today other than worsening UREGENCY, FREQUENCY, NOCTURIA, and INCONTINENCE; she does have LOU w STRESS as well. The patient is sexually active. Her last pap: approximate date 2014 and was normal and last mammogram: prior breast surgery and biopsies for [facility] and lumps normal and benign; recent breast reduction surgery.. The patient does perform self breast exams.  There is no notable family history of breast or ovarian cancer in her family.  The patient has regular exercise: yes.  The patient denies current symptoms of depression.    GYN History: Contraception: tubal ligation  PMHx: Past Medical History:  Diagnosis Date  . Anxiety   . Asthma    as a child  . Migraine    Past Surgical History:  Procedure Laterality Date  . BREAST BIOPSY Left 2012   x 2   . BREAST REDUCTION SURGERY    . ENDOMETRIAL ABLATION N/A 01/06/2015   Procedure: ENDOMETRIAL ABLATION;  Surgeon: Aletha Halim, MD;  Location: [facility] ORS;  Service: Gynecology;  Laterality: N/A;  . LAPAROSCOPIC BILATERAL SALPINGO OOPHERECTOMY Bilateral 01/06/2015   Procedure: LAPAROSCOPIC BILATERAL TUBAL LIGATION with FILSIE CLIPS ;  Surgeon: Aletha Halim, MD;  Location: [facility] ORS;  Service: Gynecology;  Laterality: Bilateral;   Family History  Problem Relation Age of Onset  . Hypertension Mother   . Stroke Mother   . Hypertension Father   . Migraines Neg Hx    Social History   Tobacco Use  . Smoking status: Former Smoker    Packs/day: 0.25    Years: 10.00    Pack years: 2.50    Types: Cigarettes    Quit date: 11/11/2016    Years since quitting: 2.8  . Smokeless tobacco: Never Used  Substance Use Topics  . Alcohol use: No  . Drug use: No    Current Outpatient Medications:  .  cyclobenzaprine (FLEXERIL) 10 MG tablet, Take 1 tablet (10  mg total) by mouth 3 (three) times daily as needed for muscle spasms., Disp: 30 tablet, Rfl: 11 .  rizatriptan (MAXALT-MLT) 10 MG disintegrating tablet, Take 1 tablet (10 mg total) by mouth once as needed for migraine. May repeat in 2 hours if needed, Disp: 10 tablet, Rfl: 11 .  botulinum toxin Type A (BOTOX) 100 units SOLR injection, Inject 155 units into the muscles of the head and neck. To be injected by the provider. Discard remainder., Disp: 200 Units, Rfl: 3 .  Galcanezumab-gnlm (EMGALITY) 120 MG/ML SOAJ, Inject 120 mg into the skin every 30 (thirty) days. (Patient not taking: Reported on 09/09/2019), Disp: 1 pen, Rfl: 11 .  mirabegron ER (MYRBETRIQ) 25 MG TB24 tablet, Take 1 tablet (25 mg total) by mouth daily., Disp: 90 tablet, Rfl: 3 .  tiZANidine (ZANAFLEX) 4 MG tablet, Take 1 tablet (4 mg total) by mouth every 6 (six) hours as needed for muscle spasms. (Patient not taking: Reported on 09/09/2019), Disp: 30 tablet, Rfl: 3 .  topiramate (TOPAMAX) 100 MG tablet, Take 1 tablet (100 mg total) by mouth at bedtime. (Patient not taking: Reported on 09/09/2019), Disp: 90 tablet, Rfl: 6 Allergies: Topiramate  Review of Systems  Constitutional: Negative for chills, fever and malaise/fatigue.  HENT: Negative for congestion, sinus pain and sore throat.   Eyes: Negative for blurred vision and pain.  Respiratory: Negative  for cough and wheezing.   Cardiovascular: Negative for chest pain and leg swelling.  Gastrointestinal: Negative for abdominal pain, constipation, diarrhea, heartburn, nausea and vomiting.  Genitourinary: Positive for frequency and urgency. Negative for dysuria and hematuria.  Musculoskeletal: Negative for back pain, joint pain, myalgias and neck pain.  Skin: Negative for itching and rash.  Neurological: Negative for dizziness, tremors and weakness.  Endo/Heme/Allergies: Does not bruise/bleed easily.  Psychiatric/Behavioral: Negative for depression. The patient is not nervous/anxious  and does not have insomnia.     Objective: BP 100/60   Ht 5\' 4"  (1.626 m)   Wt 145 lb (65.8 kg)   BMI 24.89 kg/m   Filed Weights   09/09/19 1435  Weight: 145 lb (65.8 kg)   Body mass index is 24.89 kg/m. Physical Exam Constitutional:      General: She is not in acute distress.    Appearance: She is well-developed.  Genitourinary:     Pelvic exam was performed with patient supine.     Vagina, uterus and rectum normal.     No lesions in the vagina.     No vaginal bleeding.     No cervical motion tenderness, friability, lesion or polyp.     Uterus is mobile.     Uterus is not enlarged.     No uterine mass detected.    Uterus is midaxial.     No right or left adnexal mass present.     Right adnexa not tender.     Left adnexa not tender.  HENT:     Head: Normocephalic and atraumatic. No laceration.     Right Ear: Hearing normal.     Left Ear: Hearing normal.     Mouth/Throat:     Pharynx: Uvula midline.  Eyes:     Pupils: Pupils are equal, round, and reactive to light.  Neck:     Thyroid: No thyromegaly.  Cardiovascular:     Rate and Rhythm: Normal rate and regular rhythm.     Heart sounds: No murmur. No friction rub. No gallop.   Pulmonary:     Effort: Pulmonary effort is normal. No respiratory distress.     Breath sounds: Normal breath sounds. No wheezing.  Chest:     Breasts:        Right: No mass, skin change or tenderness.        Left: No mass, skin change or tenderness.  Abdominal:     General: Bowel sounds are normal. There is no distension.     Palpations: Abdomen is soft.     Tenderness: There is no abdominal tenderness. There is no rebound.  Musculoskeletal:        General: Normal range of motion.     Cervical back: Normal range of motion and neck supple.  Neurological:     Mental Status: She is alert and oriented to person, place, and time.     Cranial Nerves: No cranial nerve deficit.  Skin:    General: Skin is warm and dry.  Psychiatric:         Judgment: Judgment normal.  Vitals reviewed.     Assessment:  ANNUAL EXAM 1. Women's annual routine gynecological examination   2. Screening for cervical cancer   3. Overactive bladder      Screening Plan:            1.  Cervical Screening-  Pap smear done today  2. Breast screening- Exam annually and mammogram>40 planned   3. Labs  managed by PCP  4. Counseling for contraception: bilateral tubal ligation   5. Overactive bladder - Pros and cons of tx options d/w pt; may be mixed picture but no cytocele today; will start w tx of OAB  - mirabegron ER (MYRBETRIQ) 25 MG TB24 tablet; Take 1 tablet (25 mg total) by mouth daily.  Dispense: 90 tablet; Refill: 3      F/U  Return in about 1 year (around 09/08/2020) for Annual.  Annamarie Major, MD, [facility], [facility] 09/09/2019  3:24 PM

## 2019-09-10 ENCOUNTER — Telehealth: Payer: Self-pay

## 2019-09-10 ENCOUNTER — Other Ambulatory Visit: Payer: Self-pay | Admitting: Obstetrics & Gynecology

## 2019-09-10 DIAGNOSIS — N3281 Overactive bladder: Secondary | ICD-10-CM

## 2019-09-10 MED ORDER — FESOTERODINE FUMARATE ER 4 MG PO TB24: 4.0000 mg | ORAL_TABLET | Freq: Every day | ORAL | 3 refills | Status: DC

## 2019-09-10 NOTE — Telephone Encounter (Signed)
Pt insurance will not cover myrbetriq she needs to try 2 of the preferred drugs, Oxybutynin, Toviaz or Vesicare , can you send in one of these RX

## 2019-09-10 NOTE — Telephone Encounter (Signed)
Yes, we figured this may happen. We will start with Toviaz, I have also had good results with this one.

## 2019-09-11 LAB — CYTOLOGY - PAP
Comment: NEGATIVE
Diagnosis: NEGATIVE
High risk HPV: NEGATIVE

## 2019-10-07 ENCOUNTER — Other Ambulatory Visit: Payer: Self-pay | Admitting: Neurology

## 2019-10-30 ENCOUNTER — Other Ambulatory Visit: Payer: Medicaid Other

## 2019-10-30 DIAGNOSIS — R112 Nausea with vomiting, unspecified: Secondary | ICD-10-CM | POA: Diagnosis not present

## 2019-11-03 ENCOUNTER — Telehealth: Payer: Self-pay | Admitting: Neurology

## 2019-11-03 NOTE — Telephone Encounter (Signed)
11-03-2019 I have sent via fax to [facility] Patient needed a new auth fax number [phone] telephone [phone] . After approval [facility] will needed to be called for TBD . Arma Heading [phone]. [facility] [phone].

## 2019-11-09 ENCOUNTER — Telehealth: Payer: Self-pay | Admitting: *Deleted

## 2019-11-09 NOTE — Telephone Encounter (Signed)
I called [facility] and spoke to [facility].  She states that [id number] and [id number] are billable and do not require PA.  Ref# for call is [phone].

## 2019-11-10 ENCOUNTER — Ambulatory Visit (INDEPENDENT_AMBULATORY_CARE_PROVIDER_SITE_OTHER): Payer: 59 | Admitting: Neurology

## 2019-11-10 ENCOUNTER — Other Ambulatory Visit: Payer: Self-pay

## 2019-11-10 VITALS — Temp 98.0°F

## 2019-11-10 DIAGNOSIS — M7918 Myalgia, other site: Secondary | ICD-10-CM

## 2019-11-10 DIAGNOSIS — G43711 Chronic migraine without aura, intractable, with status migrainosus: Secondary | ICD-10-CM

## 2019-11-10 NOTE — Progress Notes (Signed)
Botox- 200 units x 1 vial Lot: [id number] Expiration: 07/2022 NDC: 0023-3921-02  Bacteriostatic 0.9% Sodium Chloride- 4mL total Lot: [id number] Expiration: 11/12/2019 NDC: 0409-1966-02  Dx: G43.711 B/B   

## 2019-11-10 NOTE — Progress Notes (Signed)
Consent Form Botulism Toxin Injection For Chronic Migraine  11/10/2019: She has great success, 99% improvement in frequency. +a. Increase 10 units in the masseters each and extra in the traps. Reviewed mri images today as well.  15 migraine days a month. +all.  Dry Needling for cervical myofascial pain and migraines  Orders Placed This Encounter  Procedures  . Ambulatory referral to Physical Therapy    Reviewed orally with patient, additionally signature is on file:  Botulism toxin has been approved by the Federal drug administration for treatment of chronic migraine. Botulism toxin does not cure chronic migraine and it may not be effective in some patients.  The administration of botulism toxin is accomplished by injecting a small amount of toxin into the muscles of the neck and head. Dosage must be titrated for each individual. Any benefits resulting from botulism toxin tend to wear off after 3 months with a repeat injection required if benefit is to be maintained. Injections are usually done every 3-4 months with maximum effect peak achieved by about 2 or 3 weeks. Botulism toxin is expensive and you should be sure of what costs you will incur resulting from the injection.  The side effects of botulism toxin use for chronic migraine may include:   -Transient, and usually mild, facial weakness with facial injections  -Transient, and usually mild, head or neck weakness with head/neck injections  -Reduction or loss of forehead facial animation due to forehead muscle weakness  -Eyelid drooping  -Dry eye  -Pain at the site of injection or bruising at the site of injection  -Double vision  -Potential unknown long term risks  Contraindications: You should not have Botox if you are pregnant, nursing, allergic to albumin, have an infection, skin condition, or muscle weakness at the site of the injection, or have myasthenia gravis, Lambert-Eaton syndrome, or ALS.  It is also possible that as  with any injection, there may be an allergic reaction or no effect from the medication. Reduced effectiveness after repeated injections is sometimes seen and rarely infection at the injection site may occur. All care will be taken to prevent these side effects. If therapy is given over a long time, atrophy and wasting in the muscle injected may occur. Occasionally the patient's become refractory to treatment because they develop antibodies to the toxin. In this event, therapy needs to be modified.  I have read the above information and consent to the administration of botulism toxin.    BOTOX PROCEDURE NOTE FOR MIGRAINE HEADACHE    Contraindications and precautions discussed with patient(above). Aseptic procedure was observed and patient tolerated procedure. Procedure performed by Dr. Artemio Aly  The condition has existed for more than 6 months, and pt does not have a diagnosis of ALS, Myasthenia Gravis or Lambert-Eaton Syndrome.  Risks and benefits of injections discussed and pt agrees to proceed with the procedure.  Written consent obtained  These injections are medically necessary. Pt  receives good benefits from these injections. These injections do not cause sedations or hallucinations which the oral therapies may cause.  Description of procedure:  The patient was placed in a sitting position. The standard protocol was used for Botox as follows, with 5 units of Botox injected at each site:   -Procerus muscle, midline injection  -Corrugator muscle, bilateral injection  -Frontalis muscle, bilateral injection, with 2 sites each side, medial injection was performed in the upper one third of the frontalis muscle, in the region vertical from the medial inferior edge of  the superior orbital rim. The lateral injection was again in the upper one third of the forehead vertically above the lateral limbus of the cornea, 1.5 cm lateral to the medial injection site.  - Levator Scapulae: 5 units  bilaterally  -Temporalis muscle injection, 5 sites, bilaterally. The first injection was 3 cm above the tragus of the ear, second injection site was 1.5 cm to 3 cm up from the first injection site in line with the tragus of the ear. The third injection site was 1.5-3 cm forward between the first 2 injection sites. The fourth injection site was 1.5 cm posterior to the second injection site. 5th site laterally in the temporalis  muscleat the level of the outer canthus.  - Patient feels her clenching is a trigger for headaches. +5 units masseter bilaterally   - Patient feels the migraines are centered around the eyes +5 units bilaterally at the outer canthus in the orbicularis occuli  -Occipitalis muscle injection, 3 sites, bilaterally. The first injection was done one half way between the occipital protuberance and the tip of the mastoid process behind the ear. The second injection site was done lateral and superior to the first, 1 fingerbreadth from the first injection. The third injection site was 1 fingerbreadth superiorly and medially from the first injection site.  -Cervical paraspinal muscle injection, 2 sites, bilateral knee first injection site was 1 cm from the midline of the cervical spine, 3 cm inferior to the lower border of the occipital protuberance. The second injection site was 1.5 cm superiorly and laterally to the first injection site.  -Trapezius muscle injection was performed at 3 sites, bilaterally. The first injection site was in the upper trapezius muscle halfway between the inflection point of the neck, and the acromion. The second injection site was one half way between the acromion and the first injection site. The third injection was done between the first injection site and the inflection point of the neck.   Will return for repeat injection in 3 months.   A 200 unit sof Botox was used, any Botox not injected was wasted. The patient tolerated the procedure well, there were  no complications of the above procedure.

## 2019-11-20 ENCOUNTER — Other Ambulatory Visit: Payer: Self-pay | Admitting: Neurology

## 2019-11-25 ENCOUNTER — Other Ambulatory Visit: Payer: Self-pay | Admitting: Neurology

## 2019-11-25 DIAGNOSIS — G43711 Chronic migraine without aura, intractable, with status migrainosus: Secondary | ICD-10-CM

## 2019-11-25 MED ORDER — TOPIRAMATE 100 MG PO TABS: 100.0000 mg | ORAL_TABLET | Freq: Every day | ORAL | 3 refills | Status: DC

## 2019-11-26 ENCOUNTER — Telehealth: Payer: Self-pay

## 2019-11-26 DIAGNOSIS — N3281 Overactive bladder: Secondary | ICD-10-CM

## 2019-11-26 MED ORDER — FESOTERODINE FUMARATE ER 4 MG PO TB24: 4.0000 mg | ORAL_TABLET | Freq: Every day | ORAL | 3 refills | Status: DC

## 2019-11-26 NOTE — Telephone Encounter (Signed)
Pt called triage needed refill on her toviaz. Pt aware rx was sent to pharmacy

## 2019-11-30 ENCOUNTER — Telehealth: Payer: Self-pay

## 2019-11-30 ENCOUNTER — Other Ambulatory Visit: Payer: Self-pay

## 2019-11-30 DIAGNOSIS — N3281 Overactive bladder: Secondary | ICD-10-CM

## 2019-11-30 MED ORDER — FESOTERODINE FUMARATE ER 4 MG PO TB24: 4.0000 mg | ORAL_TABLET | Freq: Every day | ORAL | 3 refills | Status: DC

## 2019-11-30 NOTE — Telephone Encounter (Signed)
Pt had questions on her Michelle Rhodes, states the pharmacy told her it would be ready on 4/18 and now 4/20, I called and spoke with the pharmacy  He stated that she originally got the rx on 1/31 but its scheduled to be refilled on 4/20, pt aware

## 2019-12-01 ENCOUNTER — Ambulatory Visit: Payer: 59 | Attending: Neurology | Admitting: Physical Therapy

## 2019-12-01 ENCOUNTER — Other Ambulatory Visit: Payer: Self-pay

## 2019-12-01 ENCOUNTER — Encounter: Payer: Self-pay | Admitting: Physical Therapy

## 2019-12-01 DIAGNOSIS — R293 Abnormal posture: Secondary | ICD-10-CM | POA: Diagnosis not present

## 2019-12-01 DIAGNOSIS — G4489 Other headache syndrome: Secondary | ICD-10-CM | POA: Diagnosis not present

## 2019-12-01 DIAGNOSIS — M542 Cervicalgia: Secondary | ICD-10-CM | POA: Diagnosis not present

## 2019-12-01 NOTE — Therapy (Signed)
[facility] [address] [place], [place], [address] Phone: [phone]   Fax:  [phone]  Physical Therapy Evaluation  Patient Details  Name: Michelle Rhodes MRN: [id number] Date of Birth: 08-17-1980 Referring Provider (PT): Naomie Dean, MD   Encounter Date: 12/01/2019  PT End of Session - 12/01/19 1623    Visit Number  1    Number of Visits  21    Date for PT Re-Evaluation  02/12/20    Authorization Type  UMR    PT Start Time  1620    PT Stop Time  1708    PT Time Calculation (min)  48 min    Activity Tolerance  Patient tolerated treatment well    Behavior During Therapy  [facility] for tasks assessed/performed       Past Medical History:  Diagnosis Date  . Anxiety   . Asthma    as a child  . Migraine     Past Surgical History:  Procedure Laterality Date  . BREAST BIOPSY Left 2012   x 2   . BREAST REDUCTION SURGERY    . ENDOMETRIAL ABLATION N/A 01/06/2015   Procedure: ENDOMETRIAL ABLATION;  Surgeon: [place] Bing, MD;  Location: [facility] ORS;  Service: Gynecology;  Laterality: N/A;  . LAPAROSCOPIC BILATERAL SALPINGO OOPHERECTOMY Bilateral 01/06/2015   Procedure: LAPAROSCOPIC BILATERAL TUBAL LIGATION with FILSIE CLIPS ;  Surgeon: [place] Bing, MD;  Location: [facility] OR[facility];  Service: Gynecology;  Laterality: Bilateral;    There were no vitals filed for this visit.   Subjective Assessment - 12/01/19 1623    Subjective  Around [age] migraines began, got worse in 20s, late 20s vomiting with LOC. Botox started about a year ago. Have been on multiple medications. I have done acupuncture. Botox has been the only relief, doing every 3 months. I clench my jaw. Wake a lot due to neck pain from clenching. A lot of the pain is on Lt side. Migraines always on Lt side with vision changes, cannot get out of bed. All inputs aggrivating with migraines. Currently in braces (teeth). Breast reduction June 2019.    Patient Stated Goals   Decrease pain, work, sleep    Currently in Pain?  Yes    Pain Score  5     Pain Location  Shoulder    Pain Orientation  Left    Pain Descriptors / Indicators  Sore;Pressure;Tightness    Pain Type  Chronic pain    Aggravating Factors   night clenching    Pain Relieving Factors  heating pad         [facility] PT Assessment - 12/01/19 0001      Assessment   Medical Diagnosis  cervical myofascial pain syndrome & migraines    Referring Provider (PT)  Naomie Dean, MD    Onset Date/Surgical Date  --   chronic   Prior Therapy  not PT      Precautions   Precautions  None      Restrictions   Weight Bearing Restrictions  No      Balance Screen   Has the patient fallen in the past 6 months  No      Home Environment   Living Environment  Private residence      Prior Function   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      Cognition   Overall Cognitive Status  Within Functional Limits for tasks assessed      Observation/Other Assessments   Focus on Therapeutic Outcomes (  FOTO)   56% limited      Sensation   Additional Comments  N/T with one of meds      Posture/Postural Control   Posture Comments  rounded shoulders, bil GHJ elevation      ROM / Strength   AROM / PROM / Strength  AROM;Strength      AROM   AROM Assessment Site  Cervical    Cervical - Right Side Bend  20    Cervical - Left Side Bend  14    Cervical - Right Rotation  50    Cervical - Left Rotation  44      Strength   Overall Strength Comments  gross GHJ strength 4/5- testing increases tension in neck/shoulder                Objective measurements completed on examination: See above findings.      [facility] Adult PT Treatment/Exercise - 12/01/19 0001      Exercises   Exercises  Neck      Neck Exercises: Seated   Other Seated Exercise  scapular retraciton/resting posture      Manual Therapy   Manual Therapy  Soft tissue mobilization;Joint mobilization    Manual therapy comments  skilled  palpation and monitoring during TPDN    Joint Mobilization  Lt first rib inf mobs with breathing, Lt upper rib gr 3 mobs    Soft tissue mobilization  Lt upper trap & levator      Neck Exercises: Stretches   Upper Trapezius Stretch Limitations  upper trap stretch with sheet for first rib mob       Trigger Point Dry Needling - 12/01/19 0001    Consent Given?  Yes    Education Handout Provided  --   verbal education   Muscles Treated Head and Neck  Upper trapezius;Levator scapulae    Upper Trapezius Response  Twitch reponse elicited;Palpable increased muscle length   Left   Levator Scapulae Response  Twitch response elicited;Palpable increased muscle length   Left          PT Education - 12/01/19 1742    Education Details  anatomy of condition, POC, HEP, exercise form/rationale, TPDN & expected outcomes    Person(s) Educated  Patient    Methods  Explanation;Demonstration;Tactile cues;Verbal cues;Handout    Comprehension  Verbalized understanding;Returned demonstration;Verbal cues required;Tactile cues required;Need further instruction       PT Short Term Goals - 12/01/19 1723      PT SHORT TERM GOAL #1   Title  pt will be independent in short term HEP as it has been established    Baseline  will progress as appropriate    Time  3    Period  Weeks    Status  New    Target Date  12/25/19      PT SHORT TERM GOAL #2   Title  pt will demonstrate proper resting posture    Baseline  rounded shoulders/slouched at eval with forward head, tactile and verbal cues required for correction    Time  3    Period  Weeks    Status  New    Target Date  12/25/19        PT Long Term Goals - 12/01/19 1734      PT LONG TERM GOAL #1   Title  Gross GHJ strength 5/5    Baseline  gross 4/5 at eval    Time  10    Period  Weeks  Status  New    Target Date  02/12/20      PT LONG TERM GOAL #2   Title  Pt will report <=2/10 resting tension in cervical region    Baseline  5/10 at rest  at eval    Time  10    Period  Weeks    Status  New    Target Date  02/12/20      PT LONG TERM GOAL #3   Title  Pt will be able to sleep without being woken by tension in cervical region    Baseline  wakes up multiple times/night    Time  10    Period  Weeks    Status  New    Target Date  02/12/20      PT LONG TERM GOAL #4   Title  Pt will be able to use stretches and postures to improve pain levels and complete all work activities as needed    Baseline  has lost jobs due to effect of pain    Time  10    Period  Weeks    Status  New    Target Date  02/12/20             Plan - 12/01/19 1710    Clinical Impression Statement  Pt presents to PT with complaints of chronic migraine and cervical tightness. 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 Dry needling discussed extensively and utilized today. She reported feeling a little bit looser and we discussed the importance of using stretches/exercises and postures to maintain length. Pt will benefit from skilled PT in order to decrease cervical tension and improve postural alignment. Recent breast reduction that she states was a contributor to her posture in the past.    Personal Factors and Comorbidities  Past/Current Experience;Time since onset of injury/illness/exacerbation;Comorbidity 1    Comorbidities  anxiety    Examination-Activity Limitations  Lift;Bed Mobility;Reach Overhead;Caring for Others;Sleep    Examination-Participation Restrictions  Other;Driving;Cleaning;Meal Prep   work   Stability/Clinical Decision Making  Unstable/Unpredictable    Clinical Decision Making  High    Rehab Potential  Good    PT Frequency  2x / week    PT Duration  Other (comment)   10 weeks   PT Treatment/Interventions  ADLs/Self Care Home Management;Cryotherapy;Electrical Stimulation;Ultrasound;Traction;Moist Heat;Iontophoresis 4mg /ml Dexamethasone;Therapeutic activities;Therapeutic exercise;Patient/family education;Neuromuscular  re-education;Passive range of motion;Dry needling;Manual techniques;Taping;Spinal Manipulations;Joint Manipulations    PT Next Visit Plan  continue DN- pectoralis, suboccipitals    PT Home Exercise Plan  [id number]    Consulted and Agree with Plan of Care  Patient       Patient will benefit from skilled therapeutic intervention in order to improve the following deficits and impairments:  Pain, Improper body mechanics, Increased muscle spasms, Postural dysfunction, Decreased strength, Decreased activity tolerance, Impaired flexibility  Visit Diagnosis: Other headache syndrome - Plan: PT plan of care cert/re-cert  Cervicalgia - Plan: PT plan of care cert/re-cert  Abnormal posture - Plan: PT plan of care cert/re-cert     Problem List Patient Active Problem List   Diagnosis Date Noted  . Breast mass, left 04/30/2017  . White matter abnormality on MRI of brain 01/14/2015  . Headache 01/14/2015  . CTS (carpal tunnel syndrome) 01/13/2015  . Musculoskeletal neck pain 01/13/2015  . Chronic migraine without aura, with intractable migraine, so stated, with status migrainosus 01/13/2015    Brittain Smithey C. Omega Slager PT, DPT 12/01/19 5:43 PM   [facility] [address] [place], [place], [address] Phone: [phone]   Fax:  [phone]  Name: Michelle Rhodes MRN: [id number] Date of Birth: 08-25-1980

## 2019-12-07 ENCOUNTER — Other Ambulatory Visit: Payer: Self-pay | Admitting: Neurology

## 2019-12-07 MED ORDER — CYCLOBENZAPRINE HCL 10 MG PO TABS: 10.0000 mg | ORAL_TABLET | Freq: Three times a day (TID) | ORAL | 3 refills | Status: DC | PRN

## 2019-12-07 MED ORDER — RIZATRIPTAN BENZOATE 10 MG PO TBDP: 10.0000 mg | ORAL_TABLET | Freq: Once | ORAL | 11 refills | Status: DC | PRN

## 2019-12-15 ENCOUNTER — Ambulatory Visit: Payer: 59 | Attending: Neurology | Admitting: Physical Therapy

## 2019-12-15 ENCOUNTER — Encounter: Payer: Self-pay | Admitting: Physical Therapy

## 2019-12-15 ENCOUNTER — Other Ambulatory Visit: Payer: Self-pay

## 2019-12-15 DIAGNOSIS — M542 Cervicalgia: Secondary | ICD-10-CM | POA: Insufficient documentation

## 2019-12-15 DIAGNOSIS — G4489 Other headache syndrome: Secondary | ICD-10-CM | POA: Insufficient documentation

## 2019-12-15 DIAGNOSIS — R293 Abnormal posture: Secondary | ICD-10-CM | POA: Insufficient documentation

## 2019-12-15 NOTE — Therapy (Signed)
[facility] [address] [place], [place], [address] Phone: [phone]   Fax:  [phone]  Physical Therapy Treatment  Patient Details  Name: Michelle Rhodes MRN: [id number] Date of Birth: 1981/07/07 Referring Provider (PT): Naomie Dean, MD   Encounter Date: 12/15/2019  PT End of Session - 12/15/19 1807    Visit Number  2    Number of Visits  21    Date for PT Re-Evaluation  02/12/20    Authorization Type  UMR    PT Start Time  1715    PT Stop Time  1755    PT Time Calculation (min)  40 min    Activity Tolerance  Patient tolerated treatment well    Behavior During Therapy  [facility] for tasks assessed/performed       Past Medical History:  Diagnosis Date  . Anxiety   . Asthma    as a child  . Migraine     Past Surgical History:  Procedure Laterality Date  . BREAST BIOPSY Left 2012   x 2   . BREAST REDUCTION SURGERY    . ENDOMETRIAL ABLATION N/A 01/06/2015   Procedure: ENDOMETRIAL ABLATION;  Surgeon: [place] Bing, MD;  Location: [facility] ORS;  Service: Gynecology;  Laterality: N/A;  . LAPAROSCOPIC BILATERAL SALPINGO OOPHERECTOMY Bilateral 01/06/2015   Procedure: LAPAROSCOPIC BILATERAL TUBAL LIGATION with FILSIE CLIPS ;  Surgeon: [place] Bing, MD;  Location: [facility] OR[facility];  Service: Gynecology;  Laterality: Bilateral;    There were no vitals filed for this visit.  Subjective Assessment - 12/15/19 1758    Subjective  Patient reports a decrease in pain after DN session. She also noted an increase in UE ROM. She has not had a migraine or soreness since her last PT visit.    Currently in Pain?  No/denies   Tightness   Pain Location  Shoulder    Pain Orientation  Left    Pain Descriptors / Indicators  Tightness;Pressure;Sore    Pain Type  Chronic pain    Aggravating Factors   night clenching    Pain Relieving Factors  heating pad                       [facility] Adult PT Treatment/Exercise - 12/15/19 0001      Exercises   Exercises  Shoulder      Shoulder Exercises: Supine   Other Supine Exercises  UE backstroke w/ foam roller parallel to spine       Shoulder Exercises: Seated   Retraction  Both;20 reps;Theraband    Theraband Level (Shoulder Retraction)  Level 1 (Yellow)      Manual Therapy   Manual Therapy  Soft tissue mobilization;Joint mobilization    Manual therapy comments  skilled palpation and monitoring during TPDN    Joint Mobilization  Lt first rib inf mobs with breathing, Lt upper rib gr 3 mobs    Soft tissue mobilization  Lt upper trap & levator       Trigger Point Dry Needling - 12/15/19 0001    Consent Given?  Yes    Muscles Treated Head and Neck  Upper trapezius    Upper Trapezius Response  Twitch reponse elicited;Palpable increased muscle length    Levator Scapulae Response  Twitch response elicited;Palpable increased muscle length   Levator on Lt.           PT Education - 12/15/19 1801    Education Details  Patient educated on new HEP, posture,  and  K-tape    Person(s) Educated  Patient    Methods  Explanation;Handout    Comprehension  Verbalized understanding       PT Short Term Goals - 12/01/19 1723      PT SHORT TERM GOAL #1   Title  pt will be independent in short term HEP as it has been established    Baseline  will progress as appropriate    Time  3    Period  Weeks    Status  New    Target Date  12/25/19      PT SHORT TERM GOAL #2   Title  pt will demonstrate proper resting posture    Baseline  rounded shoulders/slouched at eval with forward head, tactile and verbal cues required for correction    Time  3    Period  Weeks    Status  New    Target Date  12/25/19        PT Long Term Goals - 12/01/19 1734      PT LONG TERM GOAL #1   Title  Gross GHJ strength 5/5    Baseline  gross 4/5 at eval    Time  10    Period  Weeks    Status  New    Target Date  02/12/20      PT LONG TERM GOAL #2   Title  Pt will report <=2/10 resting  tension in cervical region    Baseline  5/10 at rest at eval    Time  10    Period  Weeks    Status  New    Target Date  02/12/20      PT LONG TERM GOAL #3   Title  Pt will be able to sleep without being woken by tension in cervical region    Baseline  wakes up multiple times/night    Time  10    Period  Weeks    Status  New    Target Date  02/12/20      PT LONG TERM GOAL #4   Title  Pt will be able to use stretches and postures to improve pain levels and complete all work activities as needed    Baseline  has lost jobs due to effect of pain    Time  10    Period  Weeks    Status  New    Target Date  02/12/20            Plan - 12/15/19 1807    Clinical Impression Statement  Pt presents to PT with a reduction in migraines and cervical pain. She reports an increase in UE ROM. She responded favorably to bilateral TPDN in the upper traps. She indicated that she felt relieved. Shoulder strengthening exercises were provided. She would benefit from PT to further address pain and migraines.    Personal Factors and Comorbidities  Past/Current Experience;Time since onset of injury/illness/exacerbation;Comorbidity 1    Comorbidities  anxiety    Examination-Activity Limitations  Lift;Bed Mobility;Reach Overhead;Caring for Others;Sleep    Examination-Participation Restrictions  Other;Driving;Cleaning;Meal Prep    Stability/Clinical Decision Making  Unstable/Unpredictable    Clinical Decision Making  High    Rehab Potential  Good    PT Frequency  2x / week    PT Duration  Other (comment)    PT Treatment/Interventions  ADLs/Self Care Home Management;Cryotherapy;Electrical Stimulation;Ultrasound;Traction;Moist Heat;Iontophoresis 4mg /ml Dexamethasone;Therapeutic activities;Therapeutic exercise;Patient/family education;Neuromuscular re-education;Passive range of motion;Dry needling;Manual techniques;Taping;Spinal Manipulations;Joint Manipulations  PT Next Visit Plan  continue DN-  pectoralis, suboccipitals, upper trap/levator stretches, shoulder strengthening    PT Home Exercise Plan  [id number]    Consulted and Agree with Plan of Care  Patient       Patient will benefit from skilled therapeutic intervention in order to improve the following deficits and impairments:  Pain, Improper body mechanics, Increased muscle spasms, Postural dysfunction, Decreased strength, Decreased activity tolerance, Impaired flexibility  Visit Diagnosis: Other headache syndrome  Cervicalgia  Abnormal posture     Problem List Patient Active Problem List   Diagnosis Date Noted  . Breast mass, left 04/30/2017  . White matter abnormality on MRI of brain 01/14/2015  . Headache 01/14/2015  . CTS (carpal tunnel syndrome) 01/13/2015  . Musculoskeletal neck pain 01/13/2015  . Chronic migraine without aura, with intractable migraine, so stated, with status migrainosus 01/13/2015    Laveda Norman, SPT 12/15/2019, 6:15 PM  [facility] [address] [place], [place], [address] Phone: [phone]   Fax:  [phone]  Name: Michelle Rhodes MRN: [id number] Date of Birth: [birth year]

## 2019-12-17 ENCOUNTER — Ambulatory Visit: Payer: 59 | Admitting: Physical Therapy

## 2019-12-22 ENCOUNTER — Ambulatory Visit: Payer: 59 | Admitting: Physical Therapy

## 2019-12-24 ENCOUNTER — Other Ambulatory Visit: Payer: Self-pay

## 2019-12-24 ENCOUNTER — Ambulatory Visit: Payer: 59 | Admitting: Physical Therapy

## 2019-12-24 ENCOUNTER — Encounter: Payer: Self-pay | Admitting: Physical Therapy

## 2019-12-24 DIAGNOSIS — M542 Cervicalgia: Secondary | ICD-10-CM

## 2019-12-24 DIAGNOSIS — G4489 Other headache syndrome: Secondary | ICD-10-CM

## 2019-12-24 DIAGNOSIS — R293 Abnormal posture: Secondary | ICD-10-CM

## 2019-12-24 NOTE — Therapy (Signed)
[facility] [address] [place], [place], [address] Phone: [phone]   Fax:  [phone]  Physical Therapy Treatment  Patient Details  Name: Michelle Rhodes MRN: [id number] Date of Birth: 06/05/1981 Referring Provider (PT): Naomie Dean, MD   Encounter Date: 12/24/2019  PT End of Session - 12/24/19 1810    Visit Number  3    Number of Visits  21    Date for PT Re-Evaluation  02/12/20    Authorization Type  UMR    PT Start Time  1710    PT Stop Time  1800    PT Time Calculation (min)  50 min    Activity Tolerance  Patient tolerated treatment well    Behavior During Therapy  [facility] for tasks assessed/performed       Past Medical History:  Diagnosis Date  . Anxiety   . Asthma    as a child  . Migraine     Past Surgical History:  Procedure Laterality Date  . BREAST BIOPSY Left 2012   x 2   . BREAST REDUCTION SURGERY    . ENDOMETRIAL ABLATION N/A 01/06/2015   Procedure: ENDOMETRIAL ABLATION;  Surgeon: [place] Bing, MD;  Location: [facility] OR[facility];  Service: Gynecology;  Laterality: N/A;  . LAPAROSCOPIC BILATERAL SALPINGO OOPHERECTOMY Bilateral 01/06/2015   Procedure: LAPAROSCOPIC BILATERAL TUBAL LIGATION with FILSIE CLIPS ;  Surgeon: [place] Bing, MD;  Location: [facility] OR[facility];  Service: Gynecology;  Laterality: Bilateral;    There were no vitals filed for this visit.  Subjective Assessment - 12/24/19 1806    Subjective  Some initial soreness after last session but dry needling helpful for left-sided upper trap region tension. Tightness noted on right side today too and still with some residual left-sided upper trapezius region tension but still no headaches since starting PT.    Currently in Pain?  No/denies                        [facility] Adult PT Treatment/Exercise - 12/24/19 0001      Manual Therapy   Manual Therapy  Myofascial release;Manual Traction    Manual therapy comments  skilled palpation and  monitoring during TPDN    Joint Mobilization  thoracic PAs grade I-IV with brief grade V, seated thoracic HVLAT    Soft tissue mobilization  bilateral upper trapezius/levator and rhomboids    Myofascial Release  suboccipital release    Manual Traction  cervical      Neck Exercises: Stretches   Upper Trapezius Stretch  Right;Left;3 reps;30 seconds    Levator Stretch  Right;Left;3 reps;30 seconds       Trigger Point Dry Needling - 12/24/19 0001    Consent Given?  Yes    Muscles Treated Head and Neck  Upper trapezius;Levator scapulae   bilat.   Upper Trapezius Response  Twitch reponse elicited;Palpable increased muscle length    Levator Scapulae Response  Twitch response elicited           PT Education - 12/24/19 1810    Education Details  POC, dry needling    Person(s) Educated  Patient    Methods  Explanation    Comprehension  Verbalized understanding       PT Short Term Goals - 12/01/19 1723      PT SHORT TERM GOAL #1   Title  pt will be independent in short term HEP as it has been established    Baseline  will progress as  appropriate    Time  3    Period  Weeks    Status  New    Target Date  12/25/19      PT SHORT TERM GOAL #2   Title  pt will demonstrate proper resting posture    Baseline  rounded shoulders/slouched at eval with forward head, tactile and verbal cues required for correction    Time  3    Period  Weeks    Status  New    Target Date  12/25/19        PT Long Term Goals - 12/01/19 1734      PT LONG TERM GOAL #1   Title  Gross GHJ strength 5/5    Baseline  gross 4/5 at eval    Time  10    Period  Weeks    Status  New    Target Date  02/12/20      PT LONG TERM GOAL #2   Title  Pt will report <=2/10 resting tension in cervical region    Baseline  5/10 at rest at eval    Time  10    Period  Weeks    Status  New    Target Date  02/12/20      PT LONG TERM GOAL #3   Title  Pt will be able to sleep without being woken by tension in  cervical region    Baseline  wakes up multiple times/night    Time  10    Period  Weeks    Status  New    Target Date  02/12/20      PT LONG TERM GOAL #4   Title  Pt will be able to use stretches and postures to improve pain levels and complete all work activities as needed    Baseline  has lost jobs due to effect of pain    Time  10    Period  Weeks    Status  New    Target Date  02/12/20            Plan - 12/24/19 1810    Clinical Impression Statement  Still with tension bilaterally in upper trapezius and levator region but improving from baseline status with decreased migraines and responds well to manual therapy and dry needling for decreased tissue tension. LTGs ongoing-pt. would benefit from continued PT for further progress to address cervical pain and postural tension with associated functional limitations.    Personal Factors and Comorbidities  Past/Current Experience;Time since onset of injury/illness/exacerbation;Comorbidity 1    Comorbidities  anxiety    Examination-Activity Limitations  Lift;Bed Mobility;Reach Overhead;Caring for Others;Sleep    Examination-Participation Restrictions  Other;Driving;Cleaning;Meal Prep    Stability/Clinical Decision Making  Unstable/Unpredictable    Clinical Decision Making  High    Rehab Potential  Good    PT Frequency  2x / week    PT Duration  --   10 weeks   PT Treatment/Interventions  ADLs/Self Care Home Management;Cryotherapy;Electrical Stimulation;Ultrasound;Traction;Moist Heat;Iontophoresis 4mg /ml Dexamethasone;Therapeutic activities;Therapeutic exercise;Patient/family education;Neuromuscular re-education;Passive range of motion;Dry needling;Manual techniques;Taping;Spinal Manipulations;Joint Manipulations    PT Next Visit Plan  continue DN- pectoralis, suboccipitals, upper trap/levator stretches, shoulder strengthening    PT Home Exercise Plan  [id number]    Consulted and Agree with Plan of Care  Patient       Patient will  benefit from skilled therapeutic intervention in order to improve the following deficits and impairments:  Pain, Improper body mechanics, Increased muscle spasms, Postural  dysfunction, Decreased strength, Decreased activity tolerance, Impaired flexibility  Visit Diagnosis: Other headache syndrome  Cervicalgia  Abnormal posture     Problem List Patient Active Problem List   Diagnosis Date Noted  . Breast mass, left 04/30/2017  . White matter abnormality on MRI of brain 01/14/2015  . Headache 01/14/2015  . CTS (carpal tunnel syndrome) 01/13/2015  . Musculoskeletal neck pain 01/13/2015  . Chronic migraine without aura, with intractable migraine, so stated, with status migrainosus 01/13/2015    Beaulah Dinning, PT, DPT 12/24/19 6:14 PM  [facility] [address] [place], [place], [address] Phone: [phone]   Fax:  [phone]  Name: Michelle Rhodes MRN: [id number] Date of Birth: 1981/03/17

## 2019-12-28 ENCOUNTER — Ambulatory Visit: Payer: 59 | Admitting: Physical Therapy

## 2019-12-31 ENCOUNTER — Ambulatory Visit: Payer: 59 | Admitting: Physical Therapy

## 2020-01-05 ENCOUNTER — Ambulatory Visit: Payer: 59 | Admitting: Physical Therapy

## 2020-01-07 ENCOUNTER — Telehealth: Payer: Self-pay | Admitting: Physical Therapy

## 2020-01-07 ENCOUNTER — Ambulatory Visit: Payer: 59 | Admitting: Physical Therapy

## 2020-01-07 NOTE — Telephone Encounter (Signed)
Attempted to call patient regarding no show for therapy appointment-left voicemail including reminder of next appointment time.

## 2020-01-14 ENCOUNTER — Ambulatory Visit: Payer: 59 | Attending: Neurology | Admitting: Physical Therapy

## 2020-01-14 ENCOUNTER — Encounter: Payer: Self-pay | Admitting: Physical Therapy

## 2020-01-14 ENCOUNTER — Other Ambulatory Visit: Payer: Self-pay

## 2020-01-14 DIAGNOSIS — R293 Abnormal posture: Secondary | ICD-10-CM | POA: Insufficient documentation

## 2020-01-14 DIAGNOSIS — G4489 Other headache syndrome: Secondary | ICD-10-CM | POA: Insufficient documentation

## 2020-01-14 DIAGNOSIS — M542 Cervicalgia: Secondary | ICD-10-CM | POA: Diagnosis not present

## 2020-01-14 NOTE — Therapy (Signed)
[place], [place], [address] Phone: [phone]   Fax:  [phone]  Physical Therapy Treatment  Patient Details  Name: Michelle Rhodes MRN: [id number] Date of Birth: 1980-10-09 Referring Provider (PT): Sarina Ill, MD   Encounter Date: 01/14/2020  PT End of Session - 01/14/20 5701    Visit Number  4    Number of Visits  21    Date for PT Re-Evaluation  02/12/20    Authorization Type  [facility]    PT Start Time  1625    PT Stop Time  1710    PT Time Calculation (min)  45 min    Activity Tolerance  Patient tolerated treatment well    Behavior During Therapy  [facility] for tasks assessed/performed       Past Medical History:  Diagnosis Date  . Anxiety   . Asthma    as a child  . Migraine     Past Surgical History:  Procedure Laterality Date  . BREAST BIOPSY Left 2012   x 2   . BREAST REDUCTION SURGERY    . ENDOMETRIAL ABLATION N/A 01/06/2015   Procedure: ENDOMETRIAL ABLATION;  Surgeon: Aletha Halim, MD;  Location: [facility] ORS;  Service: Gynecology;  Laterality: N/A;  . LAPAROSCOPIC BILATERAL SALPINGO OOPHERECTOMY Bilateral 01/06/2015   Procedure: LAPAROSCOPIC BILATERAL TUBAL LIGATION with FILSIE CLIPS ;  Surgeon: Aletha Halim, MD;  Location: [facility] ORS;  Service: Gynecology;  Laterality: Bilateral;    There were no vitals filed for this visit.  Subjective Assessment - 01/14/20 1629    Subjective  Pt. returns, not seen for about 3 weeks for therapy wih session missed last week due to family emergency. She reports good response to last session with minimal soreness. No recent migraines but reports frequent (non-migraine) headaches. No pain today just tightness noted in upper trap region left>right.    Currently in Pain?  No/denies                        [facility] Adult PT Treatment/Exercise - 01/14/20 0001      Manual Therapy   Manual therapy comments  skilled palpation and monitoring  during TPDN    Joint Mobilization  thoracic PAs grade I-IV with brief grade V    Soft tissue mobilization  bilateral upper trapezius/levator     Myofascial Release  suboccipital release    Manual Traction  cervical      Neck Exercises: Stretches   Upper Trapezius Stretch  Right;Left;3 reps;30 seconds    Levator Stretch  Right;Left;3 reps;30 seconds    Chest Stretch  --   supine manual pec minor stretch 3x30 sec      Trigger Point Dry Needling - 01/14/20 0001    Consent Given?  Yes    Muscles Treated Head and Neck  Upper trapezius;Levator scapulae   bilat.   Upper Trapezius Response  Twitch reponse elicited;Palpable increased muscle length    Levator Scapulae Response  Twitch response elicited           PT Education - 01/14/20 1715    Education Details  POC    Person(s) Educated  Patient    Methods  Explanation    Comprehension  Verbalized understanding       PT Short Term Goals - 12/01/19 1723      PT SHORT TERM GOAL #1   Title  pt will be independent in short term HEP as it has been established  Baseline  will progress as appropriate    Time  3    Period  Weeks    Status  New    Target Date  12/25/19      PT SHORT TERM GOAL #2   Title  pt will demonstrate proper resting posture    Baseline  rounded shoulders/slouched at eval with forward head, tactile and verbal cues required for correction    Time  3    Period  Weeks    Status  New    Target Date  12/25/19        PT Long Term Goals - 12/01/19 1734      PT LONG TERM GOAL #1   Title  Gross GHJ strength 5/5    Baseline  gross 4/5 at eval    Time  10    Period  Weeks    Status  New    Target Date  02/12/20      PT LONG TERM GOAL #2   Title  Pt will report <=2/10 resting tension in cervical region    Baseline  5/10 at rest at eval    Time  10    Period  Weeks    Status  New    Target Date  02/12/20      PT LONG TERM GOAL #3   Title  Pt will be able to sleep without being woken by tension in  cervical region    Baseline  wakes up multiple times/night    Time  10    Period  Weeks    Status  New    Target Date  02/12/20      PT LONG TERM GOAL #4   Title  Pt will be able to use stretches and postures to improve pain levels and complete all work activities as needed    Baseline  has lost jobs due to effect of pain    Time  10    Period  Weeks    Status  New    Target Date  02/12/20            Plan - 01/14/20 1716    Clinical Impression Statement  Good response to tx. though status impacted by limited recent ability to attend therapy as noted in subjective. Still with left>right upper trapezius and levator region myofascial tension so continued manual therapy focus with stretches and also performance again of dry needling with brief soreness but overall good tolerance. Progressing well overall re: therapy goals so plan continue PT to help further reduce symptoms.    Personal Factors and Comorbidities  Past/Current Experience;Time since onset of injury/illness/exacerbation;Comorbidity 1    Comorbidities  anxiety    Examination-Activity Limitations  Lift;Bed Mobility;Reach Overhead;Caring for Others;Sleep    Examination-Participation Restrictions  Other;Driving;Cleaning;Meal Prep    Stability/Clinical Decision Making  Unstable/Unpredictable    Clinical Decision Making  High    Rehab Potential  Good    PT Frequency  2x / week    PT Duration  --   10 weeks   PT Treatment/Interventions  ADLs/Self Care Home Management;Cryotherapy;Electrical Stimulation;Ultrasound;Traction;Moist Heat;Iontophoresis 4mg /ml Dexamethasone;Therapeutic activities;Therapeutic exercise;Patient/family education;Neuromuscular re-education;Passive range of motion;Dry needling;Manual techniques;Taping;Spinal Manipulations;Joint Manipulations    PT Next Visit Plan  continue DN- pectoralis, suboccipitals, upper trap/levator stretches, shoulder strengthening    PT Home Exercise Plan  [id number]    Consulted and  Agree with Plan of Care  Patient       Patient will benefit from skilled therapeutic intervention in  order to improve the following deficits and impairments:  Pain, Improper body mechanics, Increased muscle spasms, Postural dysfunction, Decreased strength, Decreased activity tolerance, Impaired flexibility  Visit Diagnosis: Other headache syndrome  Cervicalgia  Abnormal posture     Problem List Patient Active Problem List   Diagnosis Date Noted  . Breast mass, left 04/30/2017  . White matter abnormality on MRI of brain 01/14/2015  . Headache 01/14/2015  . CTS (carpal tunnel syndrome) 01/13/2015  . Musculoskeletal neck pain 01/13/2015  . Chronic migraine without aura, with intractable migraine, so stated, with status migrainosus 01/13/2015    Lazarus Gowda, PT, DPT 01/14/20 5:19 PM  [facility] [address] [place], [place], [address] Phone: [phone]   Fax:  [phone]  Name: Michelle Rhodes MRN: [id number] Date of Birth: 08/21/1980

## 2020-02-13 ENCOUNTER — Emergency Department (HOSPITAL_BASED_OUTPATIENT_CLINIC_OR_DEPARTMENT_OTHER)
Admission: EM | Admit: 2020-02-13 | Discharge: 2020-02-13 | Disposition: A | Discharge status: Home / Self Care | Payer: 59 | Attending: Emergency Medicine | Admitting: Emergency Medicine

## 2020-02-13 ENCOUNTER — Encounter (HOSPITAL_BASED_OUTPATIENT_CLINIC_OR_DEPARTMENT_OTHER): Payer: Self-pay | Admitting: Emergency Medicine

## 2020-02-13 ENCOUNTER — Other Ambulatory Visit: Payer: Self-pay

## 2020-02-13 DIAGNOSIS — Z87891 Personal history of nicotine dependence: Secondary | ICD-10-CM | POA: Insufficient documentation

## 2020-02-13 DIAGNOSIS — G43019 Migraine without aura, intractable, without status migrainosus: Secondary | ICD-10-CM | POA: Insufficient documentation

## 2020-02-13 DIAGNOSIS — R110 Nausea: Secondary | ICD-10-CM | POA: Diagnosis present

## 2020-02-13 DIAGNOSIS — J45909 Unspecified asthma, uncomplicated: Secondary | ICD-10-CM | POA: Diagnosis not present

## 2020-02-13 DIAGNOSIS — G43011 Migraine without aura, intractable, with status migrainosus: Secondary | ICD-10-CM

## 2020-02-13 MED ORDER — KETOROLAC TROMETHAMINE 15 MG/ML IJ SOLN
15.0000 mg | Freq: Once | INTRAMUSCULAR | Status: AC
  Administered 2020-02-13: 15 mg via INTRAVENOUS
  Filled 2020-02-13: qty 1

## 2020-02-13 MED ORDER — DEXAMETHASONE SODIUM PHOSPHATE 10 MG/ML IJ SOLN
10.0000 mg | Freq: Once | INTRAMUSCULAR | Status: AC
  Administered 2020-02-13: 10 mg via INTRAVENOUS
  Filled 2020-02-13: qty 1

## 2020-02-13 MED ORDER — ACETAMINOPHEN 500 MG PO TABS
1000.0000 mg | ORAL_TABLET | Freq: Once | ORAL | Status: AC
  Administered 2020-02-13: 1000 mg via ORAL
  Filled 2020-02-13: qty 2

## 2020-02-13 MED ORDER — PROCHLORPERAZINE EDISYLATE 10 MG/2ML IJ SOLN
10.0000 mg | Freq: Once | INTRAMUSCULAR | Status: AC
  Administered 2020-02-13: 10 mg via INTRAVENOUS
  Filled 2020-02-13: qty 2

## 2020-02-13 MED ORDER — SODIUM CHLORIDE 0.9 % IV BOLUS (SEPSIS)
1000.0000 mL | Freq: Once | INTRAVENOUS | Status: AC
  Administered 2020-02-13: 1000 mL via INTRAVENOUS

## 2020-02-13 MED ORDER — SODIUM CHLORIDE 0.9 % IV SOLN: 1000.0000 mL | INTRAVENOUS | Status: DC

## 2020-02-13 MED ORDER — DIPHENHYDRAMINE HCL 50 MG/ML IJ SOLN
25.0000 mg | Freq: Once | INTRAMUSCULAR | Status: AC
  Administered 2020-02-13: 25 mg via INTRAVENOUS
  Filled 2020-02-13: qty 1

## 2020-02-13 NOTE — Discharge Instructions (Addendum)
You may use over-the-counter Motrin (Ibuprofen), Acetaminophen (Tylenol), topical muscle creams such as SalonPas, Icy Hot, Bengay, etc. Please stretch, apply heat, and have massage therapy for additional assistance. ° °

## 2020-02-13 NOTE — ED Triage Notes (Signed)
Pt states she has a migraine headache that started last night and she cannot get rid of it

## 2020-02-13 NOTE — ED Notes (Signed)
Attempted IV to RAC and R hand, unsuccessful, tol well

## 2020-02-13 NOTE — ED Provider Notes (Addendum)
[facility] EMERGENCY DEPARTMENT Provider Note  CSN: [id number] Arrival date & time: 02/13/20 9794  Chief Complaint(s) Migraine  HPI Michelle Rhodes is a 39 y.o. female   CC: migrain  Onset/Duration: Gradual, 1 week Timing: Constant, became worse last night Location: Left-sided Quality: Aching/stabbing Severity: Severe Modifying Factors:  Improved by: Usually managed well with prescription medication and dry needling however she has not been able to get her dry needling recently.  Home medication has not improved her pain.  Worsened by: Light, range of motion of the neck and palpating spastic musculature. Associated Signs/Symptoms:  Pertinent (+): Nausea  Pertinent (-): No emesis, no recent fevers or infections.  No coughing or congestion.  No focal deficits.  No chest pain or shortness of breath. Context: History of myofascial pain and trigger points treated with dry needling and Botox.   HPI  Past Medical History Past Medical History:  Diagnosis Date  . Anxiety   . Asthma    as a child  . Migraine    Patient Active Problem List   Diagnosis Date Noted  . Breast mass, left 04/30/2017  . White matter abnormality on MRI of brain 01/14/2015  . Headache 01/14/2015  . CTS (carpal tunnel syndrome) 01/13/2015  . Musculoskeletal neck pain 01/13/2015  . Chronic migraine without aura, with intractable migraine, so stated, with status migrainosus 01/13/2015   Home Medication(s) Prior to Admission medications   Medication Sig Start Date End Date Taking? Authorizing Provider  botulinum toxin Type A (BOTOX) 100 units SOLR injection PROVIDER TO INJECT 155 UNITS INTRAMUSCULARLY INTO HEAD AND NECK EVERY 3 MONTHS. DISCARD REMAINDER. 10/07/19   Anson Fret, MD  cyclobenzaprine (FLEXERIL) 10 MG tablet Take 1 tablet (10 mg total) by mouth 3 (three) times daily as needed for muscle spasms. 12/07/19   Anson Fret, MD  fesoterodine (TOVIAZ) 4 MG TB24 tablet Take 1 tablet  (4 mg total) by mouth daily. 11/30/19   Nadara Mustard, MD  rizatriptan (MAXALT-MLT) 10 MG disintegrating tablet Take 1 tablet (10 mg total) by mouth once as needed for migraine. May repeat in 2 hours if needed 12/07/19   Anson Fret, MD  topiramate (TOPAMAX) 100 MG tablet Take 1 tablet (100 mg total) by mouth at bedtime. 11/25/19   Anson Fret, MD                                                                                                                                    Past Surgical History Past Surgical History:  Procedure Laterality Date  . BREAST BIOPSY Left 2012   x 2   . BREAST REDUCTION SURGERY    . ENDOMETRIAL ABLATION N/A 01/06/2015   Procedure: ENDOMETRIAL ABLATION;  Surgeon: [place] Bing, MD;  Location: [facility] ORS;  Service: Gynecology;  Laterality: N/A;  . LAPAROSCOPIC BILATERAL SALPINGO OOPHERECTOMY Bilateral 01/06/2015   Procedure: LAPAROSCOPIC BILATERAL TUBAL LIGATION with FILSIE CLIPS ;  Surgeon: [place] Bing, MD;  Location: [facility] ORS;  Service: Gynecology;  Laterality: Bilateral;   Family History Family History  Problem Relation Age of Onset  . Hypertension Mother   . Stroke Mother   . Hypertension Father   . Migraines Neg Hx     Social History Social History   Tobacco Use  . Smoking status: Former Smoker    Packs/day: 0.25    Years: 10.00    Pack years: 2.50    Types: Cigarettes    Quit date: 11/11/2016    Years since quitting: 3.2  . Smokeless tobacco: Never Used  Vaping Use  . Vaping Use: Never used  Substance Use Topics  . Alcohol use: No  . Drug use: No   Allergies Topiramate  Review of Systems Review of Systems All other systems are reviewed and are negative for acute change except as noted in the HPI  Physical Exam Vital Signs  I have reviewed the triage vital signs BP 98/69 (BP Location: Left Arm)   Pulse (!) 59   Temp 98.5 F (36.9 C) (Oral)   Resp 18   Ht 5\' 4"  (1.626 m)   Wt 63.5 kg   SpO2 99%   BMI 24.03 kg/m    Physical Exam Vitals reviewed.  Constitutional:      General: She is not in acute distress.    Appearance: She is well-developed. She is not diaphoretic.  HENT:     Head: Normocephalic and atraumatic.     Right Ear: External ear normal.     Left Ear: External ear normal.     Nose: Nose normal.  Eyes:     General: No scleral icterus.    Conjunctiva/sclera: Conjunctivae normal.  Neck:     Trachea: Phonation normal.   Cardiovascular:     Rate and Rhythm: Normal rate and regular rhythm.  Pulmonary:     Effort: Pulmonary effort is normal. No respiratory distress.     Breath sounds: No stridor.  Abdominal:     General: There is no distension.  Musculoskeletal:        General: Normal range of motion.     Cervical back: Normal range of motion. No rigidity. Muscular tenderness present. No spinous process tenderness. Normal range of motion.  Neurological:     Mental Status: She is alert and oriented to person, place, and time.     Comments: Mental Status:  Alert and oriented to person, place, and time.  Attention and concentration normal.  Speech clear.  Recent memory is intact  Cranial Nerves:  II Visual Fields: Intact to confrontation. Visual fields intact. III, IV, VI: Pupils equal and reactive to light and near. Full eye movement without nystagmus  V Facial Sensation: Normal. No weakness of masticatory muscles  VII: No facial weakness or asymmetry  VIII Auditory Acuity: Grossly normal  IX/X: The uvula is midline; the palate elevates symmetrically  XI: Normal sternocleidomastoid and trapezius strength  XII: The tongue is midline. No atrophy or fasciculations.   Motor System: Muscle Strength: 5/5 and symmetric in the upper and lower extremities. No pronation or drift.  Muscle Tone: Tone and muscle bulk are normal in the upper and lower extremities.   Reflexes: DTRs: 1+ and symmetrical in all four extremities.  Coordination:  No tremor.  Sensation: Intact to light touch   Gait: Routine gait normal.   Psychiatric:        Behavior: Behavior normal.     ED Results and Treatments Labs (all  labs ordered are listed, but only abnormal results are displayed) Labs Reviewed - No data to display                                                                                                                       EKG  EKG Interpretation  Date/Time:    Ventricular Rate:    PR Interval:    QRS Duration:   QT Interval:    QTC Calculation:   R Axis:     Text Interpretation:        Radiology No results found.  Pertinent labs & imaging results that were available during my care of the patient were reviewed by me and considered in my medical decision making (see chart for details).  Medications Ordered in ED Medications  sodium chloride 0.9 % bolus 1,000 mL (1,000 mLs Intravenous New Bag/Given 02/13/20 0653)    Followed by  0.9 %  sodium chloride infusion (has no administration in time range)  diphenhydrAMINE (BENADRYL) injection 25 mg (25 mg Intravenous Given 02/13/20 0654)  dexamethasone (DECADRON) injection 10 mg (10 mg Intravenous Given 02/13/20 0656)  prochlorperazine (COMPAZINE) injection 10 mg (10 mg Intravenous Given 02/13/20 0657)  ketorolac (TORADOL) 15 MG/ML injection 15 mg (15 mg Intravenous Given 02/13/20 0655)  acetaminophen (TYLENOL) tablet 1,000 mg (1,000 mg Oral Given 02/13/20 0644)                                                                                                                                    Procedures Procedures  (including critical care time)  Medical Decision Making / ED Course I have reviewed the nursing notes for this encounter and the patient's prior records (if available in EHR or on provided paperwork).   SHERBY MONCAYO was evaluated in Emergency Department on 02/13/2020 for the symptoms described in the history of present illness. She was evaluated in the context of the global COVID-19 pandemic, which necessitated  consideration that the patient might be at risk for infection with the SARS-CoV-2 virus that causes COVID-19. Institutional protocols and algorithms that pertain to the evaluation of patients at risk for COVID-19 are in a state of rapid change based on information released by regulatory bodies including the CDC and federal and state organizations. These policies and algorithms were followed during the patient's care in the ED.  Typical migraine headache for the pt. Non focal neuro exam. No recent  head trauma. No fever. Doubt meningitis. Doubt intracranial bleed. Doubt IIH. No indication for imaging.   Will treat with migraine cocktail, heat, and IVF. Will reevaluate.  7:18 AM   Patient reports significant improvement following migraine cocktail.       Final Clinical Impression(s) / ED Diagnoses Final diagnoses:  Intractable migraine without aura and with status migrainosus   The patient appears reasonably screened and/or stabilized for discharge and I doubt any other medical condition or other [facility] requiring further screening, evaluation, or treatment in the ED at this time prior to discharge. Safe for discharge with strict return precautions.  Disposition: Discharge  Condition: Good  I have discussed the results, Dx and Tx plan with the patient/family who expressed understanding and agree(s) with the plan. Discharge instructions discussed at length. The patient/family was given strict return precautions who verbalized understanding of the instructions. No further questions at time of discharge.    ED Discharge Orders    None       Follow Up: Kaleen Mask, MD [address] [place] [address] [phone]         This chart was dictated using voice recognition software.  Despite best efforts to proofread,  errors can occur which can change the documentation meaning.     Nira Conn, MD 02/13/20 [phone]

## 2020-02-16 ENCOUNTER — Ambulatory Visit: Payer: 59 | Admitting: Neurology

## 2020-02-17 ENCOUNTER — Ambulatory Visit (INDEPENDENT_AMBULATORY_CARE_PROVIDER_SITE_OTHER): Payer: 59 | Admitting: Neurology

## 2020-02-17 ENCOUNTER — Other Ambulatory Visit: Payer: Self-pay

## 2020-02-17 ENCOUNTER — Ambulatory Visit: Payer: Self-pay | Admitting: Neurology

## 2020-02-17 DIAGNOSIS — G43711 Chronic migraine without aura, intractable, with status migrainosus: Secondary | ICD-10-CM | POA: Diagnosis not present

## 2020-02-17 NOTE — Progress Notes (Signed)
Botox- 200 units x 1 vial Lot: [id number] Expiration: 10/2022 NDC: 0023-3921-02  Bacteriostatic 0.9% Sodium Chloride- 4mL total Lot: [id number] Expiration: 05/13/2021 NDC: 0409-1966-02  Dx: G43.711 B/B  

## 2020-02-17 NOTE — Progress Notes (Signed)
Consent Form Botulism Toxin Injection For Chronic Migraine  02/17/2020: She has great success, "99%" improvement in frequency. When she doesn't have botox she starts getting headaches again which is what happened this time, we went 14 weeks in between treatments and she ended up in the ED last night, we will make sure to try and get botox scheduled every 12 weeks. +a. 10 units in the masseters each and extra in the traps.   Dry Needling for cervical myofascial pain and migraines  No orders of the defined types were placed in this encounter.   Reviewed orally with patient, additionally signature is on file:  Botulism toxin has been approved by the Federal drug administration for treatment of chronic migraine. Botulism toxin does not cure chronic migraine and it may not be effective in some patients.  The administration of botulism toxin is accomplished by injecting a small amount of toxin into the muscles of the neck and head. Dosage must be titrated for each individual. Any benefits resulting from botulism toxin tend to wear off after 3 months with a repeat injection required if benefit is to be maintained. Injections are usually done every 3-4 months with maximum effect peak achieved by about 2 or 3 weeks. Botulism toxin is expensive and you should be sure of what costs you will incur resulting from the injection.  The side effects of botulism toxin use for chronic migraine may include:   -Transient, and usually mild, facial weakness with facial injections  -Transient, and usually mild, head or neck weakness with head/neck injections  -Reduction or loss of forehead facial animation due to forehead muscle weakness  -Eyelid drooping  -Dry eye  -Pain at the site of injection or bruising at the site of injection  -Double vision  -Potential unknown long term risks  Contraindications: You should not have Botox if you are pregnant, nursing, allergic to albumin, have an infection, skin  condition, or muscle weakness at the site of the injection, or have myasthenia gravis, Lambert-Eaton syndrome, or ALS.  It is also possible that as with any injection, there may be an allergic reaction or no effect from the medication. Reduced effectiveness after repeated injections is sometimes seen and rarely infection at the injection site may occur. All care will be taken to prevent these side effects. If therapy is given over a long time, atrophy and wasting in the muscle injected may occur. Occasionally the patient's become refractory to treatment because they develop antibodies to the toxin. In this event, therapy needs to be modified.  I have read the above information and consent to the administration of botulism toxin.    BOTOX PROCEDURE NOTE FOR MIGRAINE HEADACHE    Contraindications and precautions discussed with patient(above). Aseptic procedure was observed and patient tolerated procedure. Procedure performed by Dr. Artemio Aly  The condition has existed for more than 6 months, and pt does not have a diagnosis of ALS, Myasthenia Gravis or Lambert-Eaton Syndrome.  Risks and benefits of injections discussed and pt agrees to proceed with the procedure.  Written consent obtained  These injections are medically necessary. Pt  receives good benefits from these injections. These injections do not cause sedations or hallucinations which the oral therapies may cause.  Description of procedure:  The patient was placed in a sitting position. The standard protocol was used for Botox as follows, with 5 units of Botox injected at each site:   -Procerus muscle, midline injection  -Corrugator muscle, bilateral injection  -Frontalis muscle, bilateral  injection, with 2 sites each side, medial injection was performed in the upper one third of the frontalis muscle, in the region vertical from the medial inferior edge of the superior orbital rim. The lateral injection was again in the upper one  third of the forehead vertically above the lateral limbus of the cornea, 1.5 cm lateral to the medial injection site.  - Levator Scapulae: 5 units bilaterally  -Temporalis muscle injection, 5 sites, bilaterally. The first injection was 3 cm above the tragus of the ear, second injection site was 1.5 cm to 3 cm up from the first injection site in line with the tragus of the ear. The third injection site was 1.5-3 cm forward between the first 2 injection sites. The fourth injection site was 1.5 cm posterior to the second injection site. 5th site laterally in the temporalis  muscleat the level of the outer canthus.  - Patient feels her clenching is a trigger for headaches. +5 units masseter bilaterally   - Patient feels the migraines are centered around the eyes +5 units bilaterally at the outer canthus in the orbicularis occuli  -Occipitalis muscle injection, 3 sites, bilaterally. The first injection was done one half way between the occipital protuberance and the tip of the mastoid process behind the ear. The second injection site was done lateral and superior to the first, 1 fingerbreadth from the first injection. The third injection site was 1 fingerbreadth superiorly and medially from the first injection site.  -Cervical paraspinal muscle injection, 2 sites, bilateral knee first injection site was 1 cm from the midline of the cervical spine, 3 cm inferior to the lower border of the occipital protuberance. The second injection site was 1.5 cm superiorly and laterally to the first injection site.  -Trapezius muscle injection was performed at 3 sites, bilaterally. The first injection site was in the upper trapezius muscle halfway between the inflection point of the neck, and the acromion. The second injection site was one half way between the acromion and the first injection site. The third injection was done between the first injection site and the inflection point of the neck.   Will return for  repeat injection in 3 months.   A 200 unit sof Botox was used, any Botox not injected was wasted. The patient tolerated the procedure well, there were no complications of the above procedure.

## 2020-02-25 ENCOUNTER — Other Ambulatory Visit: Payer: Self-pay

## 2020-02-25 ENCOUNTER — Ambulatory Visit: Payer: 59 | Attending: Neurology | Admitting: Physical Therapy

## 2020-02-25 ENCOUNTER — Encounter (HOSPITAL_BASED_OUTPATIENT_CLINIC_OR_DEPARTMENT_OTHER): Payer: Self-pay | Admitting: Emergency Medicine

## 2020-02-25 ENCOUNTER — Emergency Department (HOSPITAL_BASED_OUTPATIENT_CLINIC_OR_DEPARTMENT_OTHER)
Admission: EM | Admit: 2020-02-25 | Discharge: 2020-02-25 | Disposition: A | Discharge status: Home / Self Care | Payer: 59 | Attending: Emergency Medicine | Admitting: Emergency Medicine

## 2020-02-25 ENCOUNTER — Encounter: Payer: Self-pay | Admitting: Physical Therapy

## 2020-02-25 DIAGNOSIS — M542 Cervicalgia: Secondary | ICD-10-CM | POA: Diagnosis not present

## 2020-02-25 DIAGNOSIS — Z5321 Procedure and treatment not carried out due to patient leaving prior to being seen by health care provider: Secondary | ICD-10-CM | POA: Insufficient documentation

## 2020-02-25 DIAGNOSIS — G43909 Migraine, unspecified, not intractable, without status migrainosus: Secondary | ICD-10-CM | POA: Insufficient documentation

## 2020-02-25 DIAGNOSIS — R293 Abnormal posture: Secondary | ICD-10-CM | POA: Diagnosis not present

## 2020-02-25 DIAGNOSIS — G4489 Other headache syndrome: Secondary | ICD-10-CM | POA: Diagnosis not present

## 2020-02-25 NOTE — ED Triage Notes (Signed)
Migraine started 3 hours ago. Emesis at home.

## 2020-02-25 NOTE — ED Notes (Signed)
Called pt from lobby with no answer. Patient not visualized in the lobby

## 2020-02-25 NOTE — Therapy (Signed)
[place], [place], [address] Phone: [phone]   Fax:  [phone]  Physical Therapy Treatment/Recertification  Patient Details  Name: Michelle Rhodes MRN: [id number] Date of Birth: [birth year] Referring Provider (PT): Sarina Ill, MD   Encounter Date: 02/25/2020   PT End of Session - 02/25/20 1759    Visit Number 5    Number of Visits 21    Date for PT Re-Evaluation 04/07/20    Authorization Type UMR    PT Start Time 1708    PT Stop Time 1804   time spent for dry needling and estim not included in direct timed minutes   PT Time Calculation (min) 56 min    Activity Tolerance Patient tolerated treatment well    Behavior During Therapy [facility] for tasks assessed/performed           Past Medical History:  Diagnosis Date   Anxiety    Asthma    as a child   Migraine     Past Surgical History:  Procedure Laterality Date   BREAST BIOPSY Left 2012   x 2    BREAST REDUCTION SURGERY     ENDOMETRIAL ABLATION N/A 01/06/2015   Procedure: ENDOMETRIAL ABLATION;  Surgeon: Aletha Halim, MD;  Location: [facility] ORS;  Service: Gynecology;  Laterality: N/A;   LAPAROSCOPIC BILATERAL SALPINGO OOPHERECTOMY Bilateral 01/06/2015   Procedure: LAPAROSCOPIC BILATERAL TUBAL LIGATION with FILSIE CLIPS ;  Surgeon: Aletha Halim, MD;  Location: [facility] ORS;  Service: Gynecology;  Laterality: Bilateral;    There were no vitals filed for this visit.   Subjective Assessment - 02/25/20 1748    Subjective Pt. not seen since 01/14/20 with some delay getting back on the schedule due to difficulty scheduling around her work obligations. She reports migraines have been worse the past 3 weeks and were severe enough last night to go to ED though left before being seen. Today tightness noted>pain primarily in left upper trapezius and surrounding cervical + facial region.    Currently in Pain? No/denies              [facility] PT  Assessment - 02/25/20 0001      Observation/Other Assessments   Focus on Therapeutic Outcomes (FOTO)  60% limited      AROM   Cervical Flexion 15    Cervical Extension 18    Cervical - Right Side Bend 26    Cervical - Left Side Bend 22    Cervical - Right Rotation 68    Cervical - Left Rotation 70      Strength   Overall Strength Comments gross GHJ strength 4/5- testing increases tension in neck/shoulder                         [facility] Adult PT Treatment/Exercise - 02/25/20 0001      Manual Therapy   Joint Mobilization thoracic PAs grade I-IV with brief grade V    Soft tissue mobilization left upper trapezius and levator region    Myofascial Release suboccipital release    Manual Traction cervical      Neck Exercises: Stretches   Upper Trapezius Stretch Left;3 reps;30 seconds    Levator Stretch Left;3 reps;30 seconds    Chest Stretch --   supine manual pec minor stretch 30 sec x 3           Trigger Point Dry Needling - 02/25/20 0001    Consent Given? Yes  Muscles Treated Head and Neck Sternocleidomastoid;Upper trapezius;Oblique capitus;Suboccipitals;Levator scapulae;Temporalis;Masseter;Lateral pterygoid;Semispinalis capitus   left side only   Dry Needling Comments needling with 32 gauge 30 and 50 mm needles    Electrical Stimulation Performed with Dry Needling Yes    E-stim with Dry Needling Details TENS 20 pps to left upper trapezius and levator x 10 minutes                PT Education - 02/25/20 1759    Education Details POC    Person(s) Educated Patient    Methods Explanation    Comprehension Verbalized understanding            PT Short Term Goals - 02/25/20 1807      PT SHORT TERM GOAL #1   Title pt will be independent in short term HEP    Baseline met    Time 3    Period Weeks    Status Achieved      PT SHORT TERM GOAL #2   Title pt will demonstrate proper resting posture    Baseline still some tendency rounding of shoulders      Time 3    Period Weeks    Status On-going    Target Date 03/17/20             PT Long Term Goals - 02/25/20 1808      PT LONG TERM GOAL #1   Title Gross GHJ strength 5/5    Baseline grossly 4 to 4+/5    Time 6    Period Weeks    Status On-going    Target Date 04/07/20      PT LONG TERM GOAL #2   Title Pt will report <=2/10 resting tension in cervical region    Baseline variable, exacerbation with recent migainrs, goal ongoing    Time 6    Period Weeks    Status On-going      PT LONG TERM GOAL #3   Title Pt will be able to sleep without being woken by tension in cervical region    Baseline wakes up multiple times/night    Time 6    Period Weeks    Status On-going    Target Date 04/07/20      PT LONG TERM GOAL #4   Title Pt will be able to use stretches and postures to improve pain levels and complete all work activities as needed    Baseline ongoing    Time 6    Period Weeks    Status On-going                 Plan - 02/25/20 1800    Clinical Impression Statement Some setback with migraine exacerbation as noted in subjective. Pt. presents today with diffuse muscle tightness/myofascial symptoms involving left upper trapezius, levator, cervical/suboccipital and jaw region addressed with manual therapy, stretches and dry needling. Functional status still limited per FOTO report but also limited ability recent attendance. Pt. has made gains from previous status with improving cervical ROM. Plan resume/continue therapy for further progress to help relieve pain and address remaining functional limitations.    Personal Factors and Comorbidities Past/Current Experience;Time since onset of injury/illness/exacerbation;Comorbidity 1    Examination-Activity Limitations Lift;Bed Mobility;Reach Overhead;Caring for Others;Sleep    Stability/Clinical Decision Making Evolving/Moderate complexity    Clinical Decision Making Moderate    Rehab Potential Good    PT Frequency 2x  / week    PT Duration 6 weeks    PT  Treatment/Interventions ADLs/Self Care Home Management;Cryotherapy;Electrical Stimulation;Ultrasound;Traction;Moist Heat;Iontophoresis 4m/ml Dexamethasone;Therapeutic activities;Therapeutic exercise;Patient/family education;Neuromuscular re-education;Passive range of motion;Dry needling;Manual techniques;Taping;Spinal Manipulations;Joint Manipulations    PT Next Visit Plan continue DN with or without estim use, pectoralis, suboccipitals, upper trap/levator stretches, shoulder strengthening    PT [place]   Consulted and Agree with Plan of Care Patient           Patient will benefit from skilled therapeutic intervention in order to improve the following deficits and impairments:  Pain, Improper body mechanics, Increased muscle spasms, Postural dysfunction, Decreased strength, Decreased activity tolerance, Impaired flexibility  Visit Diagnosis: Other headache syndrome  Cervicalgia  Abnormal posture     Problem List Patient Active Problem List   Diagnosis Date Noted   Breast mass, left 04/30/2017   White matter abnormality on MRI of brain 01/14/2015   Headache 01/14/2015   CTS (carpal tunnel syndrome) 01/13/2015   Musculoskeletal neck pain 01/13/2015   Chronic migraine without aura, with intractable migraine, so stated, with status migrainosus 01/13/2015    CBeaulah Dinning PT, DPT 02/25/20 6:11 PM  [facility] [address]Phone: [phone]  Fax:  [phone] Name: Michelle ATWOODMRN: [id number] of Birth: 1April 13, 1982

## 2020-03-01 ENCOUNTER — Other Ambulatory Visit: Payer: Self-pay | Admitting: Neurology

## 2020-03-01 DIAGNOSIS — G43711 Chronic migraine without aura, intractable, with status migrainosus: Secondary | ICD-10-CM

## 2020-03-01 MED ORDER — SUMATRIPTAN SUCCINATE 6 MG/0.5ML ~~LOC~~ SOLN: 6.0000 mg | SUBCUTANEOUS | 6 refills | Status: DC | PRN

## 2020-03-03 ENCOUNTER — Encounter: Payer: Self-pay | Admitting: Physical Therapy

## 2020-03-03 ENCOUNTER — Other Ambulatory Visit: Payer: Self-pay

## 2020-03-03 ENCOUNTER — Ambulatory Visit (INDEPENDENT_AMBULATORY_CARE_PROVIDER_SITE_OTHER): Payer: 59 | Admitting: Family Medicine

## 2020-03-03 ENCOUNTER — Encounter: Payer: Self-pay | Admitting: Family Medicine

## 2020-03-03 ENCOUNTER — Ambulatory Visit: Payer: 59 | Admitting: Physical Therapy

## 2020-03-03 VITALS — BP 106/54 | HR 88 | Ht 64.0 in | Wt 138.0 lb

## 2020-03-03 DIAGNOSIS — R293 Abnormal posture: Secondary | ICD-10-CM | POA: Diagnosis not present

## 2020-03-03 DIAGNOSIS — G43711 Chronic migraine without aura, intractable, with status migrainosus: Secondary | ICD-10-CM

## 2020-03-03 DIAGNOSIS — M542 Cervicalgia: Secondary | ICD-10-CM | POA: Diagnosis not present

## 2020-03-03 DIAGNOSIS — G4489 Other headache syndrome: Secondary | ICD-10-CM

## 2020-03-03 MED ORDER — METHYLPREDNISOLONE 4 MG PO TBPK: ORAL_TABLET | ORAL | 0 refills | Status: DC

## 2020-03-03 NOTE — Progress Notes (Addendum)
PATIENT: Michelle Rhodes DOB: Dec 15, 1980  REASON FOR VISIT: follow up HISTORY FROM: patient  Chief Complaint  Patient presents with  . Follow-up    rm 2 here for a migraine f/u     HISTORY OF PRESENT ILLNESS: Today 03/03/20 Michelle Rhodes is a 39 y.o. female here today for follow up for headaches. She reports that over the past 4 weeks, headaches are worsening. She describes a burning sensation of the left side of her head and neck. Usually headache last hours but can be shorter lived. She is nauseated and sometimes vomits. She has blurry vision with headache. Headache usually starts at night and can wake her from her sleep. Ice seems to help. Rizatriptan helps sometimes but does not take pain away. She was prescribed sumatriptan injections but has not started these. She stopped taking topiramate this week. She was concerned about tingling and weight loss. Nothing else has changed. No snoring. She does have TMJ. She is getting massages and dry needling.    HISTORY: (copied from  note on 04/21/2018)  Interval history 04/21/2018: Migraines continue to be severe. Daily headaches. 15 migraine days a month. 4 can be severe and the rest are moderately severe. Last 24 hours. No aura. No medication overuse. Ongoing at this frequency for > year. Failed multiple medications. migraines last 24 - 72 hours. Migraines are unilateral, pounding/pulsating/throbbing, +photo/phonophobia, nausea, vomiting, severe, passes out due to the pain, movement makes it worse.   Tried and failed in the past: Topiramate, Gabapentin, Topiramate, Imitrex, Maxalt, zomig, zofran, flexeril, Zoloft, amitriptyline, sertraline, propranolol   Interval history 12/11/2016: She is having episodes of near-syncope. And she has left arm numbness and weakness which continues. She has a FHX of stroke in her mother in her 30s. She has neck pain. Her migraines have improved but she still gets headaches. The left arm numbness and  weakness has been persistent and ongoing for several years, recently worsened with noticeable weakness in that arm. She has continued eye blurriness. With the pre-syncopal episodes vision in both eyes can be affected and decreased with complete vision loss bilaterally but no alteration of awareness or consciousness; she can hear things around her but has difficulty comprehending what is being said, no urination or tongue biting.   Migraine medications tried: Gabapentin, Topiramate, Imitrex, Maxalt, zomig, zofran, flexeril, Zoloft  Interval history 07/03/2016: She has not been compliant on the medication. She did not take the topamax or the Triptan. Had a long discussion about compliance, restarting the medication. She says she is very motivated. Discussed side effects again, other choices, migraine management. Decided to restart Topiramate as well as continue the maxalt and flexeril  Interval history 01/12/2016: Haven't seen her in a year. She has been to the ED multiple times. She has left-sided weakness and numbness with the headaches. Given cocktail of Decadron, Toradol, Reglan and Benadryl with improvement. Imitrex not working, will change to Zomig nasal spray. I ordered an MRi of the brain and cervical spine last year to follow abnormal white matter changes on MRI, she did not have it completed and I reminded her today she will have the brain done. She has had the migraine for 4 weeks now. It is every year around time. For 30 days she has had a migraine, she has had 2 good days. She wakes up with headaches. The hedaches are worsening with vision changes. She has multiple a week, can be severe, has had the last headache for 4 weeks.  She has weekly headaches 2-3 a week. She takes tylenol a lot. She has tension around the eye. Migraines ar eon the left. Lights bother her, make her feel dizzy. She also has bad migraines 1-2x a year but they can be severe and last 30 days. She has neck pain,  tightness.  HPI: Michelle Rhodes is a 39 y.o. female here as a referral from Dr. Harlene Ramus for neck pain and numbness, migraines.  Migraines started at the age of 20. Migraines are worsening, always on the left side side, pulsating and throbbing, light sensitivity, sound sensitivity, 10/10 pain, they last all day and have lasted 28 days. She has to go into a dark room. She gets neck pain on the left side. She has tingling in the left hand which radiates up the arm. Migraines are once a year and can last a month. Last migraine 2 weeks ago for about 2 days, last extended migraine last July. Takes imitrex which doesn't work. She has been to the emergency room in the past but otherwise just sits in her room for 30 days. No aura.   Left arm: She has tingling of the left fingers. Happens sporadically. She wakes up with numbness in the hand all the time. No grip weakness. She has tightness in the neck but no radicular symptoms.   Reviewed notes, labs and imaging from outside physicians, which showed:   IMPRESSIO MRI of the brain (personally reviewed images) : 1. Stable nonspecific white matter signal changes without enhancement in the anterior frontal lobe. Top differential considerations remain sequelae of migraines, demyelination, and trauma. See comparison for additional diagnostic considerations. 2. 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 Recommend 1 year follow up to document stability.    REVIEW OF SYSTEMS: Out of a complete 14 system review of symptoms, the patient complains only of the following symptoms, headaches and all other reviewed systems are negative.  ALLERGIES: Allergies  Allergen Reactions  . Topiramate     Dizziness and pressure in head    HOME MEDICATIONS: Outpatient Medications Prior to Visit  Medication Sig Dispense Refill  . botulinum toxin Type A (BOTOX) 100 units SOLR injection PROVIDER TO INJECT 155 UNITS INTRAMUSCULARLY INTO HEAD AND NECK EVERY  3 MONTHS. DISCARD REMAINDER. 2 each 0  . cyclobenzaprine (FLEXERIL) 10 MG tablet Take 1 tablet (10 mg total) by mouth 3 (three) times daily as needed for muscle spasms. 90 tablet 3  . fesoterodine (TOVIAZ) 4 MG TB24 tablet Take 1 tablet (4 mg total) by mouth daily. 90 tablet 3  . rizatriptan (MAXALT-MLT) 10 MG disintegrating tablet Take 1 tablet (10 mg total) by mouth once as needed for migraine. May repeat in 2 hours if needed 10 tablet 11  . SUMAtriptan (IMITREX) 6 MG/0.5ML SOLN injection Inject 0.5 mLs (6 mg total) into the skin every 2 (two) hours as needed for migraine or headache. Take one dose at headache onset, can take additional dose 2hrs later if needed. No more then 2 injections in 24hrs 6 mL 6  . topiramate (TOPAMAX) 100 MG tablet Take 1 tablet (100 mg total) by mouth at bedtime. 90 tablet 3   No facility-administered medications prior to visit.    PAST MEDICAL HISTORY: Past Medical History:  Diagnosis Date  . Anxiety   . Asthma    as a child  . Migraine     PAST SURGICAL HISTORY: Past Surgical History:  Procedure Laterality Date  . BREAST BIOPSY Left 2012  x 2   . BREAST REDUCTION SURGERY    . ENDOMETRIAL ABLATION N/A 01/06/2015   Procedure: ENDOMETRIAL ABLATION;  Surgeon: [place] Bing, MD;  Location: [facility] ORS;  Service: Gynecology;  Laterality: N/A;  . LAPAROSCOPIC BILATERAL SALPINGO OOPHERECTOMY Bilateral 01/06/2015   Procedure: LAPAROSCOPIC BILATERAL TUBAL LIGATION with FILSIE CLIPS ;  Surgeon: [place] Bing, MD;  Location: [facility] ORS;  Service: Gynecology;  Laterality: Bilateral;    FAMILY HISTORY: Family History  Problem Relation Age of Onset  . Hypertension Mother   . Stroke Mother   . Hypertension Father   . Migraines Neg Hx     SOCIAL HISTORY: Social History   Socioeconomic History  . Marital status: Single    Spouse name: Not on file  . Number of children: 1  . Years of education: 32  . Highest education level: Not on file  Occupational  History  . Not on file  Tobacco Use  . Smoking status: Former Smoker    Packs/day: 0.25    Years: 10.00    Pack years: 2.50    Types: Cigarettes    Quit date: 11/11/2016    Years since quitting: 3.3  . Smokeless tobacco: Never Used  Vaping Use  . Vaping Use: Never used  Substance and Sexual Activity  . Alcohol use: No  . Drug use: No  . Sexual activity: Yes    Birth control/protection: Surgical  Other Topics Concern  . Not on file  Social History Narrative   Lives at home with son.   Caffeine use: tea occasionally with dinner   Right handed   Social Determinants of Health   Financial Resource Strain:   . Difficulty of Paying Living Expenses:   Food Insecurity:   . Worried About Programme researcher, broadcasting/film/video in the Last Year:   . Barista in the Last Year:   Transportation Needs:   . Freight forwarder (Medical):   Marland Kitchen Lack of Transportation (Non-Medical):   Physical Activity:   . Days of Exercise per Week:   . Minutes of Exercise per Session:   Stress:   . Feeling of Stress :   Social Connections:   . Frequency of Communication with Friends and Family:   . Frequency of Social Gatherings with Friends and Family:   . Attends Religious Services:   . Active Member of Clubs or Organizations:   . Attends Banker Meetings:   Marland Kitchen Marital Status:   Intimate Partner Violence:   . Fear of Current or Ex-Partner:   . Emotionally Abused:   Marland Kitchen Physically Abused:   . Sexually Abused:       PHYSICAL EXAM  Vitals:   03/03/20 1307  BP: (!) 106/54  Pulse: 88  Weight: 138 lb (62.6 kg)  Height: 5\' 4"  (1.626 m)   Body mass index is 23.69 kg/m.  Generalized: Well developed, in no acute distress  Cardiology: normal rate and rhythm, no murmur noted Respiratory: clear to auscultation bilaterally  Neurological examination  Mentation: Alert oriented to time, place, history taking. Follows all commands speech and language fluent Cranial nerve II-XII: Pupils were  equal round reactive to light. Extraocular movements were full, visual field were full Motor: The motor testing reveals 5 over 5 strength of all 4 extremities. Good symmetric motor tone is noted throughout.  Gait and station: Gait is normal.   DIAGNOSTIC DATA (LABS, IMAGING, TESTING) - I reviewed patient records, labs, notes, testing and imaging myself where available.  No flowsheet  data found.   Lab Results  Component Value Date   WBC 4.7 12/28/2014   HGB 13.2 12/28/2014   HCT 39.6 12/28/2014   MCV 96.9 12/28/2014   PLT 244 12/28/2014      Component Value Date/Time   NA 140 01/27/2015 1028   NA 138 01/22/2012 1319   K 4.1 01/27/2015 1028   K 3.9 01/22/2012 1319   CL 102 01/27/2015 1028   CL 105 01/22/2012 1319   CO2 19 01/27/2015 1028   CO2 27 01/22/2012 1319   GLUCOSE 83 01/27/2015 1028   GLUCOSE 104 (H) 01/22/2012 1319   BUN 13 01/27/2015 1028   BUN 9 01/22/2012 1319   CREATININE 0.72 01/27/2015 1028   CREATININE 0.64 01/22/2012 1319   CALCIUM 9.8 01/27/2015 1028   CALCIUM 9.3 01/22/2012 1319   PROT 6.7 01/27/2015 1028   ALBUMIN 4.7 01/27/2015 1028   AST 15 01/27/2015 1028   ALT 8 01/27/2015 1028   ALKPHOS 43 01/27/2015 1028   BILITOT 0.2 01/27/2015 1028   GFRNONAA 111 01/27/2015 1028   GFRNONAA >60 01/22/2012 1319   GFRAA 128 01/27/2015 1028   GFRAA >60 01/22/2012 1319   No results found for: CHOL, HDL, LDLCALC, LDLDIRECT, TRIG, CHOLHDL No results found for: QIHK7Q No results found for: VITAMINB12 No results found for: TSH     ASSESSMENT AND PLAN 39 y.o. year old female  has a past medical history of Anxiety, Asthma, and Migraine. here with     ICD-10-CM   1. Chronic migraine without aura, with intractable migraine, so stated, with status migrainosus  G43.711     Michelle Rhodes reports worsening of headaches over the past month. She has not been able to contribute worsening to any particular trigger but states she isn't sleeping well and has a history of TMJ.  She will continue massage therapy and dry needling. I will start her on medrol taper pack for abortive therpay. I have encouraged her to use her sumatriptan as well. She will continue Botox as advised. We have talked about adding Emgality in future if headaches continue to worsen. She was encouraged to focus on healthy lifestyle habits. She will follow up in 1 year if headaches resolve, sooner if needed. Botox every 12 weeks.    No orders of the defined types were placed in this encounter.    Meds ordered this encounter  Medications  . methylPREDNISolone (MEDROL DOSEPAK) 4 MG TBPK tablet    Sig: Taper pack as directed.    Dispense:  1 each    Refill:  0    Order Specific Question:   Supervising Provider    Answer:   Anson Fret [id number]      I spent 15 minutes with the patient. 50% of this time was spent counseling and educating patient on plan of care and medications.    Shawnie Dapper, FNP-C 03/03/2020, 2:27 PM [facility] [address], [address] [place], [place] [address] [phone]  Made any corrections needed, and agree with history, physical, neuro exam,assessment and plan as stated.     Naomie Dean, MD [facility]

## 2020-03-03 NOTE — Patient Instructions (Signed)
We will start steroid taper to help break the cycle of headaches. Take as directed. Use sumatriptan injections as Dr Lucia Gaskins advised. Stay well hydrated. Get plenty of rest and try to exercise regularly.   Continue Botox as directed   Follow up for office visit in 1 year, sooner if needed   Migraine Headache A migraine headache is a very strong throbbing pain on one side or both sides of your head. This type of headache can also cause other symptoms. It can last from 4 hours to 3 days. Talk with your doctor about what things may bring on (trigger) this condition. What are the causes? The exact cause of this condition is not known. This condition may be triggered or caused by:  Drinking alcohol.  Smoking.  Taking medicines, such as: ? Medicine used to treat chest pain (nitroglycerin). ? Birth control pills. ? Estrogen. ? Some blood pressure medicines.  Eating or drinking certain products.  Doing physical activity. Other things that may trigger a migraine headache include:  Having a menstrual period.  Pregnancy.  Hunger.  Stress.  Not getting enough sleep or getting too much sleep.  Weather changes.  Tiredness (fatigue). What increases the risk?  Being 51-69 years old.  Being female.  Having a family history of migraine headaches.  Being Caucasian.  Having depression or anxiety.  Being very overweight. What are the signs or symptoms?  A throbbing pain. This pain may: ? Happen in any area of the head, such as on one side or both sides. ? Make it hard to do daily activities. ? Get worse with physical activity. ? Get worse around bright lights or loud noises.  Other symptoms may include: ? Feeling sick to your stomach (nauseous). ? Vomiting. ? Dizziness. ? Being sensitive to bright lights, loud noises, or smells.  Before you get a migraine headache, you may get warning signs (an aura). An aura may include: ? Seeing flashing lights or having blind  spots. ? Seeing bright spots, halos, or zigzag lines. ? Having tunnel vision or blurred vision. ? Having numbness or a tingling feeling. ? Having trouble talking. ? Having weak muscles.  Some people have symptoms after a migraine headache (postdromal phase), such as: ? Tiredness. ? Trouble thinking (concentrating). How is this treated?  Taking medicines that: ? Relieve pain. ? Relieve the feeling of being sick to your stomach. ? Prevent migraine headaches.  Treatment may also include: ? Having acupuncture. ? Avoiding foods that bring on migraine headaches. ? Learning ways to control your body functions (biofeedback). ? Therapy to help you know and deal with negative thoughts (cognitive behavioral therapy). Follow these instructions at home: Medicines  Take over-the-counter and prescription medicines only as told by your doctor.  Ask your doctor if the medicine prescribed to you: ? Requires you to avoid driving or using heavy machinery. ? Can cause trouble pooping (constipation). You may need to take these steps to prevent or treat trouble pooping:  Drink enough fluid to keep your pee (urine) pale yellow.  Take over-the-counter or prescription medicines.  Eat foods that are high in fiber. These include beans, whole grains, and fresh fruits and vegetables.  Limit foods that are high in fat and sugar. These include fried or sweet foods. Lifestyle  Do not drink alcohol.  Do not use any products that contain nicotine or tobacco, such as cigarettes, e-cigarettes, and chewing tobacco. If you need help quitting, ask your doctor.  Get at least 8 hours of sleep  every night.  Limit and deal with stress. General instructions      Keep a journal to find out what may bring on your migraine headaches. For example, write down: ? What you eat and drink. ? How much sleep you get. ? Any change in what you eat or drink. ? Any change in your medicines.  If you have a migraine  headache: ? Avoid things that make your symptoms worse, such as bright lights. ? It may help to lie down in a dark, quiet room. ? Do not drive or use heavy machinery. ? Ask your doctor what activities are safe for you.  Keep all follow-up visits as told by your doctor. This is important. Contact a doctor if:  You get a migraine headache that is different or worse than others you have had.  You have more than 15 headache days in one month. Get help right away if:  Your migraine headache gets very bad.  Your migraine headache lasts longer than 72 hours.  You have a fever.  You have a stiff neck.  You have trouble seeing.  Your muscles feel weak or like you cannot control them.  You start to lose your balance a lot.  You start to have trouble walking.  You pass out (faint).  You have a seizure. Summary  A migraine headache is a very strong throbbing pain on one side or both sides of your head. These headaches can also cause other symptoms.  This condition may be treated with medicines and changes to your lifestyle.  Keep a journal to find out what may bring on your migraine headaches.  Contact a doctor if you get a migraine headache that is different or worse than others you have had.  Contact your doctor if you have more than 15 headache days in a month. This information is not intended to replace advice given to you by your health care provider. Make sure you discuss any questions you have with your health care provider. Document Revised: 11/21/2018 Document Reviewed: 09/11/2018 Elsevier Patient Education  2020 [employer].

## 2020-03-03 NOTE — Therapy (Addendum)
[place], [place], [address] Phone: [phone]   Fax:  [phone]  Physical Therapy Treatment/Discharge  Patient Details  Name: Michelle Rhodes MRN: [id number] Date of Birth: 30-Sep-1980 Referring Provider (PT): Sarina Ill, MD   Encounter Date: 03/03/2020   PT End of Session - 03/03/20 1801    Visit Number 6    Number of Visits 21    Date for PT Re-Evaluation 04/07/20    Authorization Type UMR    PT Start Time 1710    PT Stop Time 1811   time spent for dry needling and estim not included in direct minutes   PT Time Calculation (min) 61 min    Activity Tolerance Patient tolerated treatment well    Behavior During Therapy [facility] for tasks assessed/performed           Past Medical History:  Diagnosis Date  . Anxiety   . Asthma    as a child  . Migraine     Past Surgical History:  Procedure Laterality Date  . BREAST BIOPSY Left 2012   x 2   . BREAST REDUCTION SURGERY    . ENDOMETRIAL ABLATION N/A 01/06/2015   Procedure: ENDOMETRIAL ABLATION;  Surgeon: Aletha Halim, MD;  Location: [facility] ORS;  Service: Gynecology;  Laterality: N/A;  . LAPAROSCOPIC BILATERAL SALPINGO OOPHERECTOMY Bilateral 01/06/2015   Procedure: LAPAROSCOPIC BILATERAL TUBAL LIGATION with FILSIE CLIPS ;  Surgeon: Aletha Halim, MD;  Location: [facility] ORS;  Service: Gynecology;  Laterality: Bilateral;    There were no vitals filed for this visit.   Subjective Assessment - 03/03/20 1758    Subjective Felt like results lasted a little longer with estim use last time. Pt. had another migraine last night/has continued with issues from recent exacerbation of migraines and. Pain 7-8/10 this PM still mosltly on left side in upper trapezius region. She was prescribed a course of prednisone to help address symptoms.                             [place] Adult PT Treatment/Exercise - 03/03/20 0001      Neck Exercises:  Supine   Neck Retraction 15 reps      Manual Therapy   Joint Mobilization thoracic PAs grade I-IV with brief grade V    Soft tissue mobilization left upper trapezius and levator region    Myofascial Release suboccipital release    Manual Traction cervical      Neck Exercises: Stretches   Upper Trapezius Stretch Right;Left;2 reps;3 reps;30 seconds   3 reps on left, 2 on right   Levator Stretch Right;Left;2 reps;3 reps;30 seconds   3 reps on left, 2 on right           Trigger Point Dry Needling - 03/03/20 0001    Consent Given? Yes    Muscles Treated Head and Neck Sternocleidomastoid;Upper trapezius;Oblique capitus;Suboccipitals;Levator scapulae;Temporalis;Masseter;Lateral pterygoid;Semispinalis capitus   left side only   Muscles Treated Upper Quadrant Infraspinatus   left side   Electrical Stimulation Performed with Dry Needling Yes    E-stim with Dry Needling Details TENS 20 pps to left upper trapezius, levator  and infrapsinatus x 10 minutes                  PT Short Term Goals - 02/25/20 1807      PT SHORT TERM GOAL #1   Title pt will be independent in short term HEP  Baseline met    Time 3    Period Weeks    Status Achieved      PT SHORT TERM GOAL #2   Title pt will demonstrate proper resting posture    Baseline still some tendency rounding of shoulders    Time 3    Period Weeks    Status On-going    Target Date 03/17/20             PT Long Term Goals - 02/25/20 1808      PT LONG TERM GOAL #1   Title Gross GHJ strength 5/5    Baseline grossly 4 to 4+/5    Time 6    Period Weeks    Status On-going    Target Date 04/07/20      PT LONG TERM GOAL #2   Title Pt will report <=2/10 resting tension in cervical region    Baseline variable, exacerbation with recent migainrs, goal ongoing    Time 6    Period Weeks    Status On-going      PT LONG TERM GOAL #3   Title Pt will be able to sleep without being woken by tension in cervical region     Baseline wakes up multiple times/night    Time 6    Period Weeks    Status On-going    Target Date 04/07/20      PT LONG TERM GOAL #4   Title Pt will be able to use stretches and postures to improve pain levels and complete all work activities as needed    Baseline ongoing    Time 6    Period Weeks    Status On-going                 Plan - 03/03/20 1813    Clinical Impression Statement Temporary symptom ease with tx. but fair progress with recent symptom exacerbation for migraines-hopeful that medications can help address along with continued therapy. Still with diffuse myofascial tension in left side cervical region.    Personal Factors and Comorbidities Past/Current Experience;Time since onset of injury/illness/exacerbation;Comorbidity 1    Comorbidities anxiety    Examination-Activity Limitations Lift;Bed Mobility;Reach Overhead;Caring for Others;Sleep    Examination-Participation Restrictions Other;Driving;Cleaning;Meal Prep    Stability/Clinical Decision Making Evolving/Moderate complexity    Clinical Decision Making Moderate    Rehab Potential Good    PT Frequency 2x / week    PT Duration 6 weeks    PT Treatment/Interventions ADLs/Self Care Home Management;Cryotherapy;Electrical Stimulation;Ultrasound;Traction;Moist Heat;Iontophoresis 10m/ml Dexamethasone;Therapeutic activities;Therapeutic exercise;Patient/family education;Neuromuscular re-education;Passive range of motion;Dry needling;Manual techniques;Taping;Spinal Manipulations;Joint Manipulations    PT Next Visit Plan continue DN with or without estim use, pectoralis, suboccipitals, upper trap/levator stretches, shoulder strengthening    PT Home Exercise Plan [id number]    Consulted and Agree with Plan of Care Patient           Patient will benefit from skilled therapeutic intervention in order to improve the following deficits and impairments:  Pain, Improper body mechanics, Increased muscle spasms, Postural  dysfunction, Decreased strength, Decreased activity tolerance, Impaired flexibility  Visit Diagnosis: Other headache syndrome  Cervicalgia  Abnormal posture     Problem List Patient Active Problem List   Diagnosis Date Noted  . Breast mass, left 04/30/2017  . White matter abnormality on MRI of brain 01/14/2015  . Headache 01/14/2015  . CTS (carpal tunnel syndrome) 01/13/2015  . Musculoskeletal neck pain 01/13/2015  . Chronic migraine without aura, with intractable migraine, so stated, with  status migrainosus 01/13/2015    Beaulah Dinning, PT, DPT 03/03/20 6:15 PM  [facility] [address] [place], [place], [address] Phone: [phone]   Fax:  [phone]  Name: Michelle Rhodes MRN: [id number] Date of Birth: [birth year]  PHYSICAL THERAPY DISCHARGE SUMMARY  Visits from Start of Care: 6  Current functional level related to goals / functional outcomes: See above   Remaining deficits: See above   Education / Equipment: Anatomy of condition, POC, HEP, exercise form/rationale  Plan: Patient agrees to discharge.  Patient goals were partially met. Patient is being discharged due to the patient's request.  ?????    Work schedule does not allow for PT.  Jessica C. Hightower PT, DPT 04/13/20 6:34 PM

## 2020-03-08 ENCOUNTER — Telehealth: Payer: Self-pay | Admitting: *Deleted

## 2020-03-08 NOTE — Telephone Encounter (Signed)
Received [employer].FMLA ffrom pt to complete.  Completed to AL/NP to review/complete and sign.

## 2020-03-10 NOTE — Telephone Encounter (Signed)
Dr Lucia Gaskins signed 03-09-20 to MR.

## 2020-03-21 ENCOUNTER — Ambulatory Visit: Payer: 59 | Admitting: Physical Therapy

## 2020-03-21 MED ORDER — EMGALITY 120 MG/ML ~~LOC~~ SOAJ: 120.0000 mg | SUBCUTANEOUS | 11 refills | Status: DC

## 2020-03-21 NOTE — Addendum Note (Signed)
Addended byNicholas Lose, [place] L on: 03/21/2020 04:07 PM   Modules accepted: Orders

## 2020-03-23 ENCOUNTER — Ambulatory Visit: Payer: 59 | Admitting: Physical Therapy

## 2020-03-23 ENCOUNTER — Telehealth: Payer: Self-pay | Admitting: Family Medicine

## 2020-03-23 NOTE — Telephone Encounter (Signed)
Received request from pharmacy for PA on Emgality. PA was started on [email or web address]. Key is [id number]. Information has been submitted. [facility] was unable to send over question response. Will check back tomorrow morning.

## 2020-03-24 NOTE — Telephone Encounter (Signed)
Received question set this morning. Answered all of the questions via [email or web address]. Will continue to await determination.

## 2020-03-30 MED FILL — EMGALITY 120 MG/ML SOAJ: 120 | 30 days supply | Qty: 1 | Fill #0

## 2020-04-13 ENCOUNTER — Ambulatory Visit: Payer: 59 | Admitting: Physical Therapy

## 2020-04-19 ENCOUNTER — Ambulatory Visit: Payer: 59 | Admitting: Physical Therapy

## 2020-04-21 ENCOUNTER — Encounter: Payer: 59 | Admitting: Physical Therapy

## 2020-05-09 DIAGNOSIS — Z23 Encounter for immunization: Secondary | ICD-10-CM | POA: Diagnosis not present

## 2020-05-10 ENCOUNTER — Telehealth: Payer: Self-pay | Admitting: Neurology

## 2020-05-10 NOTE — Telephone Encounter (Signed)
Patient has an appointment tomorrow for Botox. I called [facility] and spoke with Daquan to see if codes [phone] and 79892 require PA. He states neither code requires PA. Reference #[id number]. No PA required from [facility].

## 2020-05-10 NOTE — Progress Notes (Signed)
Consent Form Botulism Toxin Injection For Chronic Migraine  05/11/2020: She still has great success, "99%" improvement in frequency. When she doesn't have botox she starts getting headaches again which is what happened last time, we went 14 weeks in between treatments and she ended up in the ED last night, we will make sure to try and get botox scheduled every 12 weeks. +a. 10 units in the masseters each and extra in the traps. Also nees  Later appointment  Dry Needling for cervical myofascial pain and migraines: felt it helped with the muscles but triggered the migraines. She has had 2 of the injections Emgality. Will try Sprix and if not effective can also try limited toradol injections at home. They are losing several people this week due to covid.   No orders of the defined types were placed in this encounter.   Reviewed orally with patient, additionally signature is on file:  Botulism toxin has been approved by the Federal drug administration for treatment of chronic migraine. Botulism toxin does not cure chronic migraine and it may not be effective in some patients.  The administration of botulism toxin is accomplished by injecting a small amount of toxin into the muscles of the neck and head. Dosage must be titrated for each individual. Any benefits resulting from botulism toxin tend to wear off after 3 months with a repeat injection required if benefit is to be maintained. Injections are usually done every 3-4 months with maximum effect peak achieved by about 2 or 3 weeks. Botulism toxin is expensive and you should be sure of what costs you will incur resulting from the injection.  The side effects of botulism toxin use for chronic migraine may include:   -Transient, and usually mild, facial weakness with facial injections  -Transient, and usually mild, head or neck weakness with head/neck injections  -Reduction or loss of forehead facial animation due to forehead muscle  weakness  -Eyelid drooping  -Dry eye  -Pain at the site of injection or bruising at the site of injection  -Double vision  -Potential unknown long term risks  Contraindications: You should not have Botox if you are pregnant, nursing, allergic to albumin, have an infection, skin condition, or muscle weakness at the site of the injection, or have myasthenia gravis, Lambert-Eaton syndrome, or ALS.  It is also possible that as with any injection, there may be an allergic reaction or no effect from the medication. Reduced effectiveness after repeated injections is sometimes seen and rarely infection at the injection site may occur. All care will be taken to prevent these side effects. If therapy is given over a long time, atrophy and wasting in the muscle injected may occur. Occasionally the patient's become refractory to treatment because they develop antibodies to the toxin. In this event, therapy needs to be modified.  I have read the above information and consent to the administration of botulism toxin.    BOTOX PROCEDURE NOTE FOR MIGRAINE HEADACHE    Contraindications and precautions discussed with patient(above). Aseptic procedure was observed and patient tolerated procedure. Procedure performed by Dr. Artemio Aly  The condition has existed for more than 6 months, and pt does not have a diagnosis of ALS, Myasthenia Gravis or Lambert-Eaton Syndrome.  Risks and benefits of injections discussed and pt agrees to proceed with the procedure.  Written consent obtained  These injections are medically necessary. Pt  receives good benefits from these injections. These injections do not cause sedations or hallucinations which the oral  therapies may cause.  Description of procedure:  The patient was placed in a sitting position. The standard protocol was used for Botox as follows, with 5 units of Botox injected at each site:   -Procerus muscle, midline injection  -Corrugator muscle, bilateral  injection  -Frontalis muscle, bilateral injection, with 2 sites each side, medial injection was performed in the upper one third of the frontalis muscle, in the region vertical from the medial inferior edge of the superior orbital rim. The lateral injection was again in the upper one third of the forehead vertically above the lateral limbus of the cornea, 1.5 cm lateral to the medial injection site.  - Levator Scapulae: 5 units bilaterally  -Temporalis muscle injection, 5 sites, bilaterally. The first injection was 3 cm above the tragus of the ear, second injection site was 1.5 cm to 3 cm up from the first injection site in line with the tragus of the ear. The third injection site was 1.5-3 cm forward between the first 2 injection sites. The fourth injection site was 1.5 cm posterior to the second injection site. 5th site laterally in the temporalis  muscleat the level of the outer canthus.  - Patient feels her clenching is a trigger for headaches. +5 units masseter bilaterally   - Patient feels the migraines are centered around the eyes +5 units bilaterally at the outer canthus in the orbicularis occuli  -Occipitalis muscle injection, 3 sites, bilaterally. The first injection was done one half way between the occipital protuberance and the tip of the mastoid process behind the ear. The second injection site was done lateral and superior to the first, 1 fingerbreadth from the first injection. The third injection site was 1 fingerbreadth superiorly and medially from the first injection site.  -Cervical paraspinal muscle injection, 2 sites, bilateral knee first injection site was 1 cm from the midline of the cervical spine, 3 cm inferior to the lower border of the occipital protuberance. The second injection site was 1.5 cm superiorly and laterally to the first injection site.  -Trapezius muscle injection was performed at 3 sites, bilaterally. The first injection site was in the upper trapezius muscle  halfway between the inflection point of the neck, and the acromion. The second injection site was one half way between the acromion and the first injection site. The third injection was done between the first injection site and the inflection point of the neck.   Will return for repeat injection in 3 months.   A 200 unit sof Botox was used, any Botox not injected was wasted. The patient tolerated the procedure well, there were no complications of the above procedure.

## 2020-05-11 ENCOUNTER — Ambulatory Visit (INDEPENDENT_AMBULATORY_CARE_PROVIDER_SITE_OTHER): Payer: 59 | Admitting: Neurology

## 2020-05-11 ENCOUNTER — Other Ambulatory Visit: Payer: Self-pay

## 2020-05-11 DIAGNOSIS — G43711 Chronic migraine without aura, intractable, with status migrainosus: Secondary | ICD-10-CM

## 2020-05-11 MED ORDER — KETOROLAC TROMETHAMINE 15.75 MG/SPRAY NA SOLN: NASAL | 6 refills | Status: DC

## 2020-05-11 NOTE — Progress Notes (Signed)
Botox- 200 units x 1 vial Lot: [id number] Expiration: 01/2023 NDC: 0023-3921-02  Bacteriostatic 0.9% Sodium Chloride- 4mL total Lot: [id number] Expiration: 05/13/2021 NDC: 0409-1966-02  Dx: G43.711 B/B  

## 2020-05-31 ENCOUNTER — Ambulatory Visit: Payer: 59 | Admitting: Neurology

## 2020-06-07 ENCOUNTER — Ambulatory Visit (INDEPENDENT_AMBULATORY_CARE_PROVIDER_SITE_OTHER): Payer: 59

## 2020-06-07 ENCOUNTER — Other Ambulatory Visit: Payer: Self-pay

## 2020-06-07 DIAGNOSIS — R109 Unspecified abdominal pain: Secondary | ICD-10-CM

## 2020-06-07 DIAGNOSIS — R309 Painful micturition, unspecified: Secondary | ICD-10-CM | POA: Diagnosis not present

## 2020-06-07 LAB — POCT URINALYSIS DIPSTICK
Bilirubin, UA: NEGATIVE
Glucose, UA: NEGATIVE
Ketones, UA: NEGATIVE
Nitrite, UA: NEGATIVE
Protein, UA: NEGATIVE
Spec Grav, UA: >=1.03 — AB (ref 1.010–1.025)
Urobilinogen, UA: NEGATIVE E.U./dL — AB
pH, UA: 6 (ref 5.0–8.0)

## 2020-06-07 NOTE — Progress Notes (Signed)
Pt came in to drop off a clean catch urine sample for sxs of pain with urination and pressure in lower belly.  See u/a results.  Will send off for culture.

## 2020-06-09 LAB — URINE CULTURE: Organism ID, Bacteria: NO GROWTH

## 2020-06-09 NOTE — Progress Notes (Signed)
Pt aware.

## 2020-06-09 NOTE — Progress Notes (Signed)
Let her know Urine Culture is neg for infection

## 2020-07-06 DIAGNOSIS — U071 COVID-19: Secondary | ICD-10-CM | POA: Diagnosis not present

## 2020-07-06 DIAGNOSIS — Z20828 Contact with and (suspected) exposure to other viral communicable diseases: Secondary | ICD-10-CM | POA: Diagnosis not present

## 2020-07-14 ENCOUNTER — Telehealth: Payer: Self-pay | Admitting: *Deleted

## 2020-07-14 NOTE — Telephone Encounter (Addendum)
Called pharmacy, she should have refills on file. Spoke with staff who stated they need PA on Emgality, not refills. She will fax over PA papers. Will answer patient's my chart to advise her.

## 2020-07-19 ENCOUNTER — Telehealth: Payer: Self-pay

## 2020-07-19 NOTE — Telephone Encounter (Signed)
A PA has been sent out via [facility] for emgality  Key: [id number] - PA Case ID: [id number] - Rx #: [id number] status pending

## 2020-07-26 DIAGNOSIS — L820 Inflamed seborrheic keratosis: Secondary | ICD-10-CM | POA: Diagnosis not present

## 2020-07-28 NOTE — Progress Notes (Signed)
PA submitted for Emgality 120mg  on Cover my Meds. There is a 24/72hr turn around. PA ID #: [id number] Key: [id number] Rx #: Status: PENDING

## 2020-08-01 DIAGNOSIS — U071 COVID-19: Secondary | ICD-10-CM | POA: Diagnosis not present

## 2020-08-01 DIAGNOSIS — Z20828 Contact with and (suspected) exposure to other viral communicable diseases: Secondary | ICD-10-CM | POA: Diagnosis not present

## 2020-08-02 MED FILL — EMGALITY 120 MG/ML SOAJ: 120 | 30 days supply | Qty: 1 | Fill #1

## 2020-08-08 NOTE — Telephone Encounter (Signed)
The request has been approved. The authorization is effective for a maximum of 1 fills from 07/28/2020 to 08/27/2020, as long as the member is enrolled in their current health plan. The request was approved as submitted. This request is approved for 2 pens for 1 month. An additional prior authorization (3203) has been entered for Emgality Pen 120mg /mL. This request is effective for 5 fills from 08/20/2020 through 01/17/2021, and is limited to 1 pen per month. Renewal requires that the patient has experienced a reduction in migraine or headache frequency of at least 2 days per month OR the patient has experienced a reduction in migraine severity or migraine duration with Emgality therapy. A written notification letter will follow with additional details.  Key: 03/19/2021 - PA Case ID: [id number]

## 2020-08-14 DIAGNOSIS — Z20828 Contact with and (suspected) exposure to other viral communicable diseases: Secondary | ICD-10-CM | POA: Diagnosis not present

## 2020-08-14 DIAGNOSIS — U071 COVID-19: Secondary | ICD-10-CM | POA: Diagnosis not present

## 2020-08-15 DIAGNOSIS — U071 COVID-19: Secondary | ICD-10-CM | POA: Diagnosis not present

## 2020-08-15 DIAGNOSIS — R110 Nausea: Secondary | ICD-10-CM | POA: Diagnosis not present

## 2020-08-15 DIAGNOSIS — E039 Hypothyroidism, unspecified: Secondary | ICD-10-CM | POA: Diagnosis not present

## 2020-08-15 DIAGNOSIS — E785 Hyperlipidemia, unspecified: Secondary | ICD-10-CM | POA: Diagnosis not present

## 2020-08-15 DIAGNOSIS — Z20828 Contact with and (suspected) exposure to other viral communicable diseases: Secondary | ICD-10-CM | POA: Diagnosis not present

## 2020-08-15 DIAGNOSIS — R5383 Other fatigue: Secondary | ICD-10-CM | POA: Diagnosis not present

## 2020-08-15 DIAGNOSIS — L630 Alopecia (capitis) totalis: Secondary | ICD-10-CM | POA: Diagnosis not present

## 2020-08-15 DIAGNOSIS — E559 Vitamin D deficiency, unspecified: Secondary | ICD-10-CM | POA: Diagnosis not present

## 2020-08-16 ENCOUNTER — Ambulatory Visit (INDEPENDENT_AMBULATORY_CARE_PROVIDER_SITE_OTHER): Payer: 59 | Admitting: Neurology

## 2020-08-16 DIAGNOSIS — G43711 Chronic migraine without aura, intractable, with status migrainosus: Secondary | ICD-10-CM

## 2020-08-16 MED ORDER — UBRELVY 100 MG PO TABS: 100.0000 mg | ORAL_TABLET | ORAL | 0 refills | Status: DC | PRN

## 2020-08-16 NOTE — Patient Instructions (Signed)
Ubrogepant tablets What is this medicine? UBROGEPANT (ue BROE je pant) is used to treat migraine headaches with or without aura. An aura is a strange feeling or visual disturbance that warns you of an attack. It is not used to prevent migraines. This medicine may be used for other purposes; ask your health care provider or pharmacist if you have questions. COMMON BRAND NAME(S): Ubrelvy What should I tell my health care provider before I take this medicine? They need to know if you have any of these conditions:  kidney disease  liver disease  an unusual or allergic reaction to ubrogepant, other medicines, foods, dyes, or preservatives  pregnant or trying to get pregnant  breast-feeding How should I use this medicine? Take this medicine by mouth with a glass of water. Follow the directions on the prescription label. You can take it with or without food. If it upsets your stomach, take it with food. Take your medicine at regular intervals. Do not take it more often than directed. Do not stop taking except on your doctor's advice. Talk to your pediatrician about the use of this medicine in children. Special care may be needed. Overdosage: If you think you have taken too much of this medicine contact a poison control center or emergency room at once. NOTE: This medicine is only for you. Do not share this medicine with others. What if I miss a dose? This does not apply. This medicine is not for regular use. What may interact with this medicine? Do not take this medicine with any of the following medicines:  ceritinib  certain antibiotics like chloramphenicol, clarithromycin, telithromycin  certain antivirals for HIV like atazanavir, cobicistat, darunavir, delavirdine, fosamprenavir, indinavir, ritonavir  certain medicines for fungal infections like itraconazole, ketoconazole, posaconazole,  voriconazole  conivaptan  grapefruit  idelalisib  mifepristone  nefazodone  ribociclib This medicine may also interact with the following medications:  carvedilol  certain medicines for seizures like phenobarbital, phenytoin  ciprofloxacin  cyclosporine  eltrombopag  fluconazole  fluvoxamine  quinidine  rifampin  St. John's wort  verapamil This list may not describe all possible interactions. Give your health care provider a list of all the medicines, herbs, non-prescription drugs, or dietary supplements you use. Also tell them if you smoke, drink alcohol, or use illegal drugs. Some items may interact with your medicine. What should I watch for while using this medicine? Visit your health care professional for regular checks on your progress. Tell your health care professional if your symptoms do not start to get better or if they get worse. Your mouth may get dry. Chewing sugarless gum or sucking hard candy and drinking plenty of water may help. Contact your health care professional if the problem does not go away or is severe. What side effects may I notice from receiving this medicine? Side effects that you should report to your doctor or health care professional as soon as possible:  allergic reactions like skin rash, itching or hives; swelling of the face, lips, or tongue Side effects that usually do not require medical attention (report these to your doctor or health care professional if they continue or are bothersome):  drowsiness  dry mouth  nausea  tiredness This list may not describe all possible side effects. Call your doctor for medical advice about side effects. You may report side effects to FDA at 1-800-FDA-1088. Where should I keep my medicine? Keep out of the reach of children. Store at room temperature between 15 and 30 degrees C (59   and 86 degrees F). Throw away any unused medicine after the expiration date. NOTE: This sheet is a summary. It  may not cover all possible information. If you have questions about this medicine, talk to your doctor, pharmacist, or health care provider.  2020 Elsevier/Gold Standard (2018-10-16 08:50:55)  

## 2020-08-16 NOTE — Progress Notes (Signed)
Botox- 200 units x 1 vial Lot: [id number] Expiration: 04/2023 NDC: 0023-3921-02  Bacteriostatic 0.9% Sodium Chloride- 4mL total Lot: [id number] Expiration: 09/13/2021 NDC: 0409-1966-02  Dx: G43.711 B/B  

## 2020-08-16 NOTE — Progress Notes (Signed)
Consent Form Botulism Toxin Injection For Chronic Migraine    08/16/2020: Still doing excellent. She had a difficult time over the summer with migraines. Now she is back to 1-2 migraines a month which is a success. Gave her 3 emgality samples if it helps we can give her more samples she had a difficult time getting it with her insurance.   05/11/2020: She still has great success, "99%" improvement in frequency. When she doesn't have botox she starts getting headaches again which is what happened last time, we went 14 weeks in between treatments and she ended up in the ED last night, we will make sure to try and get botox scheduled every 12 weeks. +a. 10 units in the masseters each and extra in the traps. Also nees  Later appointment  Dry Needling for cervical myofascial pain and migraines: felt it helped with the muscles but triggered the migraines. She has had 2 of the injections Emgality. Will try Sprix and if not effective can also try limited toradol injections at home. They are losing several people this week due to covid.   No orders of the defined types were placed in this encounter.   Reviewed orally with patient, additionally signature is on file:  Botulism toxin has been approved by the Federal drug administration for treatment of chronic migraine. Botulism toxin does not cure chronic migraine and it may not be effective in some patients.  The administration of botulism toxin is accomplished by injecting a small amount of toxin into the muscles of the neck and head. Dosage must be titrated for each individual. Any benefits resulting from botulism toxin tend to wear off after 3 months with a repeat injection required if benefit is to be maintained. Injections are usually done every 3-4 months with maximum effect peak achieved by about 2 or 3 weeks. Botulism toxin is expensive and you should be sure of what costs you will incur resulting from the injection.  The side effects of botulism  toxin use for chronic migraine may include:   -Transient, and usually mild, facial weakness with facial injections  -Transient, and usually mild, head or neck weakness with head/neck injections  -Reduction or loss of forehead facial animation due to forehead muscle weakness  -Eyelid drooping  -Dry eye  -Pain at the site of injection or bruising at the site of injection  -Double vision  -Potential unknown long term risks  Contraindications: You should not have Botox if you are pregnant, nursing, allergic to albumin, have an infection, skin condition, or muscle weakness at the site of the injection, or have myasthenia gravis, Lambert-Eaton syndrome, or ALS.  It is also possible that as with any injection, there may be an allergic reaction or no effect from the medication. Reduced effectiveness after repeated injections is sometimes seen and rarely infection at the injection site may occur. All care will be taken to prevent these side effects. If therapy is given over a long time, atrophy and wasting in the muscle injected may occur. Occasionally the patient's become refractory to treatment because they develop antibodies to the toxin. In this event, therapy needs to be modified.  I have read the above information and consent to the administration of botulism toxin.    BOTOX PROCEDURE NOTE FOR MIGRAINE HEADACHE    Contraindications and precautions discussed with patient(above). Aseptic procedure was observed and patient tolerated procedure. Procedure performed by Dr. Artemio Aly  The condition has existed for more than 6 months, and pt does  not have a diagnosis of ALS, Myasthenia Gravis or Lambert-Eaton Syndrome.  Risks and benefits of injections discussed and pt agrees to proceed with the procedure.  Written consent obtained  These injections are medically necessary. Pt  receives good benefits from these injections. These injections do not cause sedations or hallucinations which the oral  therapies may cause.  Description of procedure:  The patient was placed in a sitting position. The standard protocol was used for Botox as follows, with 5 units of Botox injected at each site:   -Procerus muscle, midline injection  -Corrugator muscle, bilateral injection  -Frontalis muscle, bilateral injection, with 2 sites each side, medial injection was performed in the upper one third of the frontalis muscle, in the region vertical from the medial inferior edge of the superior orbital rim. The lateral injection was again in the upper one third of the forehead vertically above the lateral limbus of the cornea, 1.5 cm lateral to the medial injection site.  - Levator Scapulae: 5 units bilaterally  -Temporalis muscle injection, 5 sites, bilaterally. The first injection was 3 cm above the tragus of the ear, second injection site was 1.5 cm to 3 cm up from the first injection site in line with the tragus of the ear. The third injection site was 1.5-3 cm forward between the first 2 injection sites. The fourth injection site was 1.5 cm posterior to the second injection site. 5th site laterally in the temporalis  muscleat the level of the outer canthus.  - Patient feels her clenching is a trigger for headaches. +5 units masseter bilaterally   - Patient feels the migraines are centered around the eyes +5 units bilaterally at the outer canthus in the orbicularis occuli  -Occipitalis muscle injection, 3 sites, bilaterally. The first injection was done one half way between the occipital protuberance and the tip of the mastoid process behind the ear. The second injection site was done lateral and superior to the first, 1 fingerbreadth from the first injection. The third injection site was 1 fingerbreadth superiorly and medially from the first injection site.  -Cervical paraspinal muscle injection, 2 sites, bilateral knee first injection site was 1 cm from the midline of the cervical spine, 3 cm inferior  to the lower border of the occipital protuberance. The second injection site was 1.5 cm superiorly and laterally to the first injection site.  -Trapezius muscle injection was performed at 3 sites, bilaterally. The first injection site was in the upper trapezius muscle halfway between the inflection point of the neck, and the acromion. The second injection site was one half way between the acromion and the first injection site. The third injection was done between the first injection site and the inflection point of the neck.   Will return for repeat injection in 3 months.   A 200 unit sof Botox was used, any Botox not injected was wasted. The patient tolerated the procedure well, there were no complications of the above procedure.

## 2020-08-22 DIAGNOSIS — U071 COVID-19: Secondary | ICD-10-CM | POA: Diagnosis not present

## 2020-10-05 ENCOUNTER — Telehealth: Payer: Self-pay

## 2020-10-05 NOTE — Telephone Encounter (Signed)
Patient is scheduled for 11/01/20 at 2:30

## 2020-10-05 NOTE — Telephone Encounter (Signed)
Pt wants to see [facility] for annual on 11/01/20 any time after 3pm can you schedule this please

## 2020-11-01 ENCOUNTER — Ambulatory Visit (INDEPENDENT_AMBULATORY_CARE_PROVIDER_SITE_OTHER): Payer: 59 | Admitting: Obstetrics & Gynecology

## 2020-11-01 ENCOUNTER — Other Ambulatory Visit: Payer: Self-pay

## 2020-11-01 ENCOUNTER — Encounter: Payer: Self-pay | Admitting: Obstetrics & Gynecology

## 2020-11-01 VITALS — BP 90/60 | Ht 64.0 in | Wt 148.0 lb

## 2020-11-01 DIAGNOSIS — N3946 Mixed incontinence: Secondary | ICD-10-CM | POA: Diagnosis not present

## 2020-11-01 DIAGNOSIS — R350 Frequency of micturition: Secondary | ICD-10-CM

## 2020-11-01 DIAGNOSIS — Z01419 Encounter for gynecological examination (general) (routine) without abnormal findings: Secondary | ICD-10-CM | POA: Diagnosis not present

## 2020-11-01 LAB — POCT URINALYSIS DIPSTICK
Bilirubin, UA: NEGATIVE
Glucose, UA: NEGATIVE
Ketones, UA: NEGATIVE
Leukocytes, UA: NEGATIVE
Nitrite, UA: NEGATIVE
Protein, UA: NEGATIVE
Spec Grav, UA: 1.01 (ref 1.010–1.025)
Urobilinogen, UA: 0.2 E.U./dL
pH, UA: 5 (ref 5.0–8.0)

## 2020-11-01 MED ORDER — TOVIAZ 8 MG PO TB24: 8.0000 mg | ORAL_TABLET | Freq: Every day | ORAL | 3 refills | Status: DC

## 2020-11-01 NOTE — Progress Notes (Signed)
HPI:      [name] is a 40 y.o. [phone] who LMP was No LMP recorded. Patient has had an ablation., she presents today for her annual examination. The patient has no complaints today othe than continued bladder sx's- freq, urge, nocturia, stress incontinence, urge incontinence, leakage all day, Toviaz 4 mg daily helped for 2-3 mos then not as much so she stopped; no side effects to Toviaz.  Prior Rx Myrbetriq too costly..   The patient is sexually active. Her last pap: approximate date 2021 and was normal and last mammogram: approximate date 2020 and was normal. The patient does perform self breast exams.  There is no notable family history of breast or ovarian cancer in her family.  The patient has regular exercise: yes.  The patient denies current symptoms of depression.    GYN History: Contraception: tubal ligation  PMHx: Past Medical History:  Diagnosis Date  . Anxiety   . Asthma    as a child  . Migraine    Past Surgical History:  Procedure Laterality Date  . BREAST BIOPSY Left 2012   x 2   . BREAST REDUCTION SURGERY    . ENDOMETRIAL ABLATION N/A 01/06/2015   Procedure: ENDOMETRIAL ABLATION;  Surgeon: Michelle Bing, MD;  Location: [facility] ORS;  Service: Gynecology;  Laterality: N/A;  . LAPAROSCOPIC BILATERAL SALPINGO OOPHERECTOMY Bilateral 01/06/2015   Procedure: LAPAROSCOPIC BILATERAL TUBAL LIGATION with FILSIE CLIPS ;  Surgeon:  Bing, MD;  Location: [facility] ORS;  Service: Gynecology;  Laterality: Bilateral;   Family History  Problem Relation Age of Onset  . Hypertension Mother   . Stroke Mother   . Hypertension Father   . Colon cancer Paternal Grandfather   . Migraines Neg Hx    Social History   Tobacco Use  . Smoking status: Former Smoker    Packs/day: 0.25    Years: 10.00    Pack years: 2.50    Types: Cigarettes    Quit date: 11/11/2016    Years since quitting: 3.9  . Smokeless tobacco: Never Used  Vaping Use  . Vaping Use: Every day  Substance  Use Topics  . Alcohol use: No  . Drug use: No    Current Outpatient Medications:  .  botulinum toxin Type A (BOTOX) 100 units SOLR injection, PROVIDER TO INJECT 155 UNITS INTRAMUSCULARLY INTO HEAD AND NECK EVERY 3 MONTHS. DISCARD REMAINDER., Disp: 2 each, Rfl: 0 .  cyclobenzaprine (FLEXERIL) 10 MG tablet, Take 1 tablet (10 mg total) by mouth 3 (three) times daily as needed for muscle spasms., Disp: 90 tablet, Rfl: 3 .  fesoterodine (TOVIAZ) 8 MG TB24 tablet, Take 1 tablet (8 mg total) by mouth daily., Disp: 90 tablet, Rfl: 3 .  Galcanezumab-gnlm (EMGALITY) 120 MG/ML SOAJ, Inject 120 mg into the skin every 30 (thirty) days., Disp: 1 mL, Rfl: 11 .  rizatriptan (MAXALT-MLT) 10 MG disintegrating tablet, Take 1 tablet (10 mg total) by mouth once as needed for migraine. May repeat in 2 hours if needed, Disp: 10 tablet, Rfl: 11 .  Ketorolac Tromethamine (SPRIX) 15.75 MG/SPRAY SOLN, 1 spray in each nostril every 6-8 hours up to 4 times a day. (Patient not taking: Reported on 11/01/2020), Disp: 6 each, Rfl: 6 .  methylPREDNISolone (MEDROL DOSEPAK) 4 MG TBPK tablet, Taper pack as directed. (Patient not taking: Reported on 11/01/2020), Disp: 1 each, Rfl: 0 .  SUMAtriptan (IMITREX) 6 MG/0.5ML SOLN injection, Inject 0.5 mLs (6 mg total) into the skin every 2 (two)  hours as needed for migraine or headache. Take one dose at headache onset, can take additional dose 2hrs later if needed. No more then 2 injections in 24hrs (Patient not taking: Reported on 11/01/2020), Disp: 6 mL, Rfl: 6 .  Ubrogepant (UBRELVY) 100 MG TABS, Take 100 mg by mouth every 2 (two) hours as needed. Maximum 200mg  a day. (Patient not taking: Reported on 11/01/2020), Disp: 4 tablet, Rfl: 0 Allergies: Topiramate  Review of Systems  Constitutional: Negative for chills, fever and malaise/fatigue.  HENT: Negative for congestion, sinus pain and sore throat.   Eyes: Negative for blurred vision and pain.  Respiratory: Negative for cough and wheezing.    Cardiovascular: Negative for chest pain and leg swelling.  Gastrointestinal: Negative for abdominal pain, constipation, diarrhea, heartburn, nausea and vomiting.  Genitourinary: Positive for frequency and urgency. Negative for dysuria and hematuria.  Musculoskeletal: Negative for back pain, joint pain, myalgias and neck pain.  Skin: Negative for itching and rash.  Neurological: Negative for dizziness, tremors and weakness.  Endo/Heme/Allergies: Does not bruise/bleed easily.  Psychiatric/Behavioral: Negative for depression. The patient is not nervous/anxious and does not have insomnia.     Objective: BP 90/60   Ht 5\' 4"  (1.626 m)   Wt 148 lb (67.1 kg)   BMI 25.40 kg/m   Filed Weights   11/01/20 1417  Weight: 148 lb (67.1 kg)   Body mass index is 25.4 kg/m. Physical Exam Constitutional:      General: She is not in acute distress.    Appearance: She is well-developed.  Genitourinary:     Bladder, rectum and urethral meatus normal.     No lesions in the vagina.     No vaginal bleeding.      Right Adnexa: not tender and no mass present.    Left Adnexa: not tender and no mass present.    No cervical motion tenderness, friability, lesion or polyp.     Uterus is not enlarged or prolapsed.     No uterine mass detected.    Uterus exam comments: No sig uterine prolapse.     Uterus is midaxial.     Bladder exam comments: Min cystocele.     Pelvic exam was performed with patient in the lithotomy position.  Breasts:     Right: No mass, skin change or tenderness.     Left: No mass, skin change or tenderness.    HENT:     Head: Normocephalic and atraumatic. No laceration.     Right Ear: Hearing normal.     Left Ear: Hearing normal.     Mouth/Throat:     Pharynx: Uvula midline.  Eyes:     Pupils: Pupils are equal, round, and reactive to light.  Neck:     Thyroid: No thyromegaly.  Cardiovascular:     Rate and Rhythm: Normal rate and regular rhythm.     Heart sounds: No murmur  heard. No friction rub. No gallop.   Pulmonary:     Effort: Pulmonary effort is normal. No respiratory distress.     Breath sounds: Normal breath sounds. No wheezing.  Abdominal:     General: Bowel sounds are normal. There is no distension.     Palpations: Abdomen is soft.     Tenderness: There is no abdominal tenderness. There is no rebound.  Musculoskeletal:        General: Normal range of motion.     Cervical back: Normal range of motion and neck supple.  Neurological:  Mental Status: She is alert and oriented to person, place, and time.     Cranial Nerves: No cranial nerve deficit.  Skin:    General: Skin is warm and dry.  Psychiatric:        Judgment: Judgment normal.  Vitals reviewed.     Assessment:  ANNUAL EXAM 1. Urine frequency   2. Mixed urge and stress incontinence   3. Women's annual routine gynecological examination      Screening Plan:            1.  Cervical Screening-  Pap smear schedule reviewed with patient  2. Breast screening- Exam annually and mammogram>40 planned   3. Colonoscopy every 10 years, Hemoccult testing - after age 67  4. Labs managed by PCP  5. Counseling for contraception: bilateral tubal ligation   6. Urine frequency Eval for UTI, blood on UA noted - POCT urinalysis dipstick - Urine Culture  2. Mixed urge and stress incontinence Continues to be a problem, and min help on Toviaz 4 mg daily - fesoterodine (TOVIAZ) 8 MG TB24 tablet; Take 1 tablet (8 mg total) by mouth daily.  Dispense: 90 tablet; Refill: 3 Try higher dose, but also see urology for additional investigation or recommendations for therapy - Ambulatory referral to Urology     F/U  Return in about 1 year (around 11/01/2021) for Annual.  Annamarie Major, MD, [facility], [facility] 11/01/2020  2:55 PM

## 2020-11-01 NOTE — Patient Instructions (Signed)
Thank you for choosing [facility]. As part of our ongoing efforts to improve patient experience, we would appreciate your feedback. Please fill out the short survey that you will receive by mail or MyChart. Your opinion is important to us! -Dr Harris  

## 2020-11-03 ENCOUNTER — Telehealth: Payer: Self-pay

## 2020-11-03 ENCOUNTER — Other Ambulatory Visit: Payer: Self-pay | Admitting: Obstetrics & Gynecology

## 2020-11-03 DIAGNOSIS — R109 Unspecified abdominal pain: Secondary | ICD-10-CM

## 2020-11-03 DIAGNOSIS — R309 Painful micturition, unspecified: Secondary | ICD-10-CM

## 2020-11-03 LAB — URINE CULTURE

## 2020-11-03 NOTE — Telephone Encounter (Signed)
Referral to urology already requested, and needed. (Plz make sure this is being done) Can obtain pelvic ultrasound to assess for uterine or ovarian causes for pain. Will order at [facility], plz schedule the [place].

## 2020-11-03 NOTE — Telephone Encounter (Signed)
Pt calling for results, she's in pain has not went to work in 2 days. Per Victorino Dike the urine results are normal. Please advise.

## 2020-11-04 NOTE — Telephone Encounter (Signed)
Pt is scheduled with Dr Apolinar Junes on 11/29/20 @ 2:45

## 2020-11-15 ENCOUNTER — Other Ambulatory Visit (HOSPITAL_COMMUNITY): Payer: Self-pay

## 2020-11-15 ENCOUNTER — Ambulatory Visit (INDEPENDENT_AMBULATORY_CARE_PROVIDER_SITE_OTHER): Payer: 59 | Admitting: Neurology

## 2020-11-15 DIAGNOSIS — G43711 Chronic migraine without aura, intractable, with status migrainosus: Secondary | ICD-10-CM

## 2020-11-15 MED ORDER — UBRELVY 100 MG PO TABS
100.0000 mg | ORAL_TABLET | ORAL | 0 refills | Status: DC | PRN
  Filled 2020-11-15 – 2020-11-30 (×2): qty 16, 30d supply, fill #0

## 2020-11-15 MED ORDER — METHYLPREDNISOLONE 4 MG PO TBPK
ORAL_TABLET | ORAL | 1 refills | Status: DC
  Filled 2020-11-15: qty 1, 6d supply, fill #0
  Filled 2020-11-30: qty 21, 6d supply, fill #0

## 2020-11-15 MED ORDER — CYCLOBENZAPRINE HCL 10 MG PO TABS
10.0000 mg | ORAL_TABLET | Freq: Three times a day (TID) | ORAL | 11 refills | Status: DC | PRN
  Filled 2020-11-15 – 2020-11-30 (×2): qty 90, 30d supply, fill #0
  Filled 2021-02-09: qty 90, 30d supply, fill #1

## 2020-11-15 MED ORDER — SUMATRIPTAN SUCCINATE 6 MG/0.5ML ~~LOC~~ SOLN
6.0000 mg | SUBCUTANEOUS | 6 refills | Status: DC | PRN
  Filled 2020-11-15: qty 6, 1d supply, fill #0

## 2020-11-15 MED ORDER — RIZATRIPTAN BENZOATE 10 MG PO TBDP
10.0000 mg | ORAL_TABLET | Freq: Once | ORAL | 11 refills | Status: DC | PRN
  Filled 2020-11-15 – 2020-11-30 (×2): qty 18, 30d supply, fill #0
  Filled 2021-02-09: qty 18, 30d supply, fill #1

## 2020-11-15 NOTE — Progress Notes (Signed)
Consent Form Botulism Toxin Injection For Chronic Migraine    11/15/2020: doing excellent.+a. 10 units in the masseters each and extra in the traps. Also needs  Later appointment. Levada Schilling are the worst for her, gave her 4 Ajovy samples to take over the summer.   08/16/2020: Still doing excellent. She had a difficult time over the summer with migraines. Now she is back to 1-2 migraines a month which is a success. Gave her 3 emgality samples if it helps we can give her more samples she had a difficult time getting it with her insurance. +a. 10 units in the masseters each and extra in the traps. Also nees  Later appointment   05/11/2020: She still has great success, "99%" improvement in frequency. When she doesn't have botox she starts getting headaches again which is what happened last time, we went 14 weeks in between treatments and she ended up in the ED last night, we will make sure to try and get botox scheduled every 12 weeks. +a. 10 units in the masseters each and extra in the traps. Also nees  Later appointment  Dry Needling for cervical myofascial pain and migraines: felt it helped with the muscles but triggered the migraines. She has had 2 of the injections Emgality. Will try Sprix and if not effective can also try limited toradol injections at home. They are losing several people this week due to covid.   No orders of the defined types were placed in this encounter.   Reviewed orally with patient, additionally signature is on file:  Botulism toxin has been approved by the Federal drug administration for treatment of chronic migraine. Botulism toxin does not cure chronic migraine and it may not be effective in some patients.  The administration of botulism toxin is accomplished by injecting a small amount of toxin into the muscles of the neck and head. Dosage must be titrated for each individual. Any benefits resulting from botulism toxin tend to wear off after 3 months with a repeat  injection required if benefit is to be maintained. Injections are usually done every 3-4 months with maximum effect peak achieved by about 2 or 3 weeks. Botulism toxin is expensive and you should be sure of what costs you will incur resulting from the injection.  The side effects of botulism toxin use for chronic migraine may include:   -Transient, and usually mild, facial weakness with facial injections  -Transient, and usually mild, head or neck weakness with head/neck injections  -Reduction or loss of forehead facial animation due to forehead muscle weakness  -Eyelid drooping  -Dry eye  -Pain at the site of injection or bruising at the site of injection  -Double vision  -Potential unknown long term risks  Contraindications: You should not have Botox if you are pregnant, nursing, allergic to albumin, have an infection, skin condition, or muscle weakness at the site of the injection, or have myasthenia gravis, Lambert-Eaton syndrome, or ALS.  It is also possible that as with any injection, there may be an allergic reaction or no effect from the medication. Reduced effectiveness after repeated injections is sometimes seen and rarely infection at the injection site may occur. All care will be taken to prevent these side effects. If therapy is given over a long time, atrophy and wasting in the muscle injected may occur. Occasionally the patient's become refractory to treatment because they develop antibodies to the toxin. In this event, therapy needs to be modified.  I have read the above  information and consent to the administration of botulism toxin.    BOTOX PROCEDURE NOTE FOR MIGRAINE HEADACHE    Contraindications and precautions discussed with patient(above). Aseptic procedure was observed and patient tolerated procedure. Procedure performed by Dr. Artemio Aly  The condition has existed for more than 6 months, and pt does not have a diagnosis of ALS, Myasthenia Gravis or Lambert-Eaton  Syndrome.  Risks and benefits of injections discussed and pt agrees to proceed with the procedure.  Written consent obtained  These injections are medically necessary. Pt  receives good benefits from these injections. These injections do not cause sedations or hallucinations which the oral therapies may cause.  Description of procedure:  The patient was placed in a sitting position. The standard protocol was used for Botox as follows, with 5 units of Botox injected at each site:   -Procerus muscle, midline injection  -Corrugator muscle, bilateral injection  -Frontalis muscle, bilateral injection, with 2 sites each side, medial injection was performed in the upper one third of the frontalis muscle, in the region vertical from the medial inferior edge of the superior orbital rim. The lateral injection was again in the upper one third of the forehead vertically above the lateral limbus of the cornea, 1.5 cm lateral to the medial injection site.  - Levator Scapulae: 5 units bilaterally  -Temporalis muscle injection, 5 sites, bilaterally. The first injection was 3 cm above the tragus of the ear, second injection site was 1.5 cm to 3 cm up from the first injection site in line with the tragus of the ear. The third injection site was 1.5-3 cm forward between the first 2 injection sites. The fourth injection site was 1.5 cm posterior to the second injection site. 5th site laterally in the temporalis  muscleat the level of the outer canthus.  - Patient feels her clenching is a trigger for headaches. +5 units masseter bilaterally   - Patient feels the migraines are centered around the eyes +5 units bilaterally at the outer canthus in the orbicularis occuli  -Occipitalis muscle injection, 3 sites, bilaterally. The first injection was done one half way between the occipital protuberance and the tip of the mastoid process behind the ear. The second injection site was done lateral and superior to the first,  1 fingerbreadth from the first injection. The third injection site was 1 fingerbreadth superiorly and medially from the first injection site.  -Cervical paraspinal muscle injection, 2 sites, bilateral knee first injection site was 1 cm from the midline of the cervical spine, 3 cm inferior to the lower border of the occipital protuberance. The second injection site was 1.5 cm superiorly and laterally to the first injection site.  -Trapezius muscle injection was performed at 3 sites, bilaterally. The first injection site was in the upper trapezius muscle halfway between the inflection point of the neck, and the acromion. The second injection site was one half way between the acromion and the first injection site. The third injection was done between the first injection site and the inflection point of the neck.   Will return for repeat injection in 3 months.   A 200 unit sof Botox was used, any Botox not injected was wasted. The patient tolerated the procedure well, there were no complications of the above procedure.

## 2020-11-15 NOTE — Progress Notes (Signed)
Botox- 200 units x 1 vial Lot: [id number] Expiration: 06/2023 NDC: 0023-3921-02  Bacteriostatic 0.9% Sodium Chloride- 4mL total Lot: [id number] Expiration: 09/13/2021 NDC: 0409-1966-02  Dx: G43.711 B/B  

## 2020-11-23 ENCOUNTER — Other Ambulatory Visit (HOSPITAL_COMMUNITY): Payer: Self-pay

## 2020-11-29 ENCOUNTER — Encounter: Payer: Self-pay | Admitting: Urology

## 2020-11-29 ENCOUNTER — Other Ambulatory Visit: Payer: Self-pay

## 2020-11-29 ENCOUNTER — Ambulatory Visit (INDEPENDENT_AMBULATORY_CARE_PROVIDER_SITE_OTHER): Payer: 59 | Admitting: Urology

## 2020-11-29 VITALS — BP 108/71 | HR 68 | Ht 64.0 in | Wt 148.0 lb

## 2020-11-29 DIAGNOSIS — K5909 Other constipation: Secondary | ICD-10-CM

## 2020-11-29 DIAGNOSIS — N3946 Mixed incontinence: Secondary | ICD-10-CM

## 2020-11-29 MED ORDER — VIBEGRON 75 MG PO TABS: 75.0000 mg | ORAL_TABLET | Freq: Every day | ORAL | 0 refills | Status: DC

## 2020-11-29 NOTE — Progress Notes (Signed)
11/29/2020 3:46 PM   Michelle Rhodes June 18, 1981 [id number]  Referring provider: Nadara Mustard, MD [address] [place],  [place] [address]  Chief Complaint  Patient presents with  . Urinary Incontinence    HPI: 40 year old female who presents today for further evaluation of longstanding urinary symptoms.  She reports over the past 3 to 4 years, she has had leakage when she laughs, coughs, and sneezes.  This seems to be worsening.  She goes through several pads per day.  She changed that immediately when she is saturated.  This is very bothersome to her.  In addition to the above, she has had severe urinary frequency, both daytime and nighttime.  She reports that on some occasions, she goes up to 3 times in 1 hour.  Several years ago, she tried Myrbetriq but was unable to afford this medication.  She also tried Toviaz 4 mg in the remote past which was not particularly helpful.  In the last month or so, she is increased her dose to Toviaz 8 mg which has helped a little bit but she has severe dry mouth related to this.  She denies any overt dysuria.  She does have some discomfort/pressure in her bladder area prior to voiding.    She is sexually active.  She does not planning on having anymore children.  She has 1 biological child who is 68.  She does have chronic constipation.  Over the past year this has been worsening.  She has very hard sometimes painful stools every 3 or 4 days.  He is not on any current bowel regimen.  No issues with UTI, flank pain or stones.   PMH: Past Medical History:  Diagnosis Date  . Anxiety   . Asthma    as a child  . Migraine     Surgical History: Past Surgical History:  Procedure Laterality Date  . BREAST BIOPSY Left 2012   x 2   . BREAST REDUCTION SURGERY    . ENDOMETRIAL ABLATION N/A 01/06/2015   Procedure: ENDOMETRIAL ABLATION;  Surgeon: Schley Bing, MD;  Location: [facility];  Service: Gynecology;  Laterality: N/A;  .  LAPAROSCOPIC BILATERAL SALPINGO OOPHERECTOMY Bilateral 01/06/2015   Procedure: LAPAROSCOPIC BILATERAL TUBAL LIGATION with FILSIE CLIPS ;  Surgeon: Brookhaven Bing, MD;  Location: [facility];  Service: Gynecology;  Laterality: Bilateral;    Home Medications:  Allergies as of 11/29/2020   No Active Allergies     Medication List       Accurate as of November 29, 2020  3:46 PM. If you have any questions, ask your nurse or doctor.        STOP taking these medications   Toviaz 8 MG Tb24 tablet Generic drug: fesoterodine Stopped by: Vanna Scotland, MD     TAKE these medications   Botox 100 units Solr injection Generic drug: botulinum toxin Type A PROVIDER TO INJECT 155 UNITS INTRAMUSCULARLY INTO HEAD AND NECK EVERY 3 MONTHS. DISCARD REMAINDER.   cyclobenzaprine 10 MG tablet Commonly known as: FLEXERIL Take 1 tablet (10 mg total) by mouth 3 (three) times daily as needed for muscle spasms.   Ketorolac Tromethamine 15.75 MG/SPRAY Soln Commonly known as: Sprix 1 spray in each nostril every 6-8 hours up to 4 times a day.   methylPREDNISolone 4 MG Tbpk tablet Commonly known as: MEDROL DOSEPAK Taper pack as directed.   rizatriptan 10 MG disintegrating tablet Commonly known as: MAXALT-MLT Take 1 tablet (10 mg total) by mouth once as needed for  migraine. May repeat in 2 hours if needed   SUMAtriptan 6 MG/0.5ML Soln injection Commonly known as: IMITREX Inject 0.5 mLs (6 mg total) into the skin every 2 (two) hours as needed for migraine or headache. Take one dose at headache onset, can take additional dose 2hrs later if needed. No more then 2 injections in 24hrs   Ubrelvy 100 MG Tabs Generic drug: Ubrogepant Take 100 mg by mouth every 2 (two) hours as needed. Maximum 200mg  a day.   Vibegron 75 MG Tabs Take 75 mg by mouth daily. Started by: , MD       Allergies:  No Active Allergies  Family History: Family History  Problem Relation Age of Onset  . Hypertension  Mother   . Stroke Mother   . Hypertension Father   . Colon cancer Paternal Grandfather   . Migraines Neg Hx     Social History:  reports that she quit smoking about 4 years ago. Her smoking use included cigarettes. She has a 2.50 pack-year smoking history. She has never used smokeless tobacco. She reports that she does not drink alcohol and does not use drugs.   Physical Exam: BP 108/71   Pulse 68   Ht 5\' 4"  (1.626 m)   Wt 148 lb (67.1 kg)   BMI 25.40 kg/m   Constitutional:  Alert and oriented, No acute distress. HEENT: [place] AT, moist mucus membranes.  Trachea midline, no masses. Cardiovascular: No clubbing, cyanosis, or edema. Respiratory: Normal respiratory effort, no increased work of breathing. Skin: No rashes, bruises or suspicious lesions. Neurologic: Grossly intact, no focal deficits, moving all 4 extremities. Psychiatric: Normal mood and affect.  Laboratory Data: Lab Results  Component Value Date   WBC 4.7 12/28/2014   HGB 13.2 12/28/2014   HCT 39.6 12/28/2014   MCV 96.9 12/28/2014   PLT 244 12/28/2014    Lab Results  Component Value Date   CREATININE 0.72 01/27/2015    Urinalysis Urinalysis today is negative  Pertinent Imaging: PVR minimal  Assessment & Plan:    1. Mixed urge and stress incontinence Severe mixed urinary incontinence, worsening  She seems to be bothered both by stress and urge incontinence  Given her dry mouth with Toviaz, will change his medication with trial of Gemtesa 75 mg x 1 month  And ask her to see my partner, Dr. 12/30/2014 who specializes in female urinary incontinence.  She may ultimately need urodynamics.  She was given literature including the book "bladder matters".  She understands a lot of this will be trial and error.  - Urinalysis, Complete - Bladder Scan (Post Void Residual) in office  2. Chronic constipation Needed lengthy discussion today about how her chronic constipation may be contributing to her urinary  symptoms  Advised to continue copious water like she is drinking, add daily stool softener as well as MiraLAX to achieve a goal of 1 soft daily bowel movement   F/u Dr. 01/29/2015, MD  [facility] [address] [place], [address] [place] [phone]

## 2020-11-30 ENCOUNTER — Other Ambulatory Visit (HOSPITAL_COMMUNITY): Payer: Self-pay

## 2020-12-01 LAB — URINALYSIS, COMPLETE
Bilirubin, UA: NEGATIVE
Glucose, UA: NEGATIVE
Ketones, UA: NEGATIVE
Nitrite, UA: NEGATIVE
Protein,UA: NEGATIVE
Specific Gravity, UA: 1.01 (ref 1.005–1.030)
Urobilinogen, Ur: 0.2 mg/dL (ref 0.2–1.0)
pH, UA: 7 (ref 5.0–7.5)

## 2020-12-01 LAB — MICROSCOPIC EXAMINATION: Bacteria, UA: NONE SEEN

## 2020-12-12 ENCOUNTER — Ambulatory Visit: Payer: 59 | Attending: Obstetrics & Gynecology

## 2020-12-15 ENCOUNTER — Ambulatory Visit: Payer: 59 | Admitting: Obstetrics & Gynecology

## 2020-12-26 DIAGNOSIS — Z0001 Encounter for general adult medical examination with abnormal findings: Secondary | ICD-10-CM | POA: Diagnosis not present

## 2020-12-26 DIAGNOSIS — Z0000 Encounter for general adult medical examination without abnormal findings: Secondary | ICD-10-CM | POA: Diagnosis not present

## 2020-12-26 DIAGNOSIS — Z13220 Encounter for screening for lipoid disorders: Secondary | ICD-10-CM | POA: Diagnosis not present

## 2020-12-26 DIAGNOSIS — Z720 Tobacco use: Secondary | ICD-10-CM | POA: Diagnosis not present

## 2020-12-26 DIAGNOSIS — Z0100 Encounter for examination of eyes and vision without abnormal findings: Secondary | ICD-10-CM | POA: Diagnosis not present

## 2021-01-11 ENCOUNTER — Other Ambulatory Visit (HOSPITAL_COMMUNITY): Payer: Self-pay

## 2021-01-11 MED ORDER — CARESTART COVID-19 HOME TEST VI KIT
PACK | 0 refills | Status: DC
  Filled 2021-01-11: qty 4, 4d supply, fill #0

## 2021-01-16 ENCOUNTER — Ambulatory Visit: Payer: Self-pay | Admitting: Urology

## 2021-01-30 ENCOUNTER — Ambulatory Visit: Payer: Self-pay | Admitting: Urology

## 2021-02-09 ENCOUNTER — Other Ambulatory Visit (HOSPITAL_COMMUNITY): Payer: Self-pay

## 2021-02-17 ENCOUNTER — Ambulatory Visit (INDEPENDENT_AMBULATORY_CARE_PROVIDER_SITE_OTHER): Payer: 59 | Admitting: Neurology

## 2021-02-17 DIAGNOSIS — K220 Achalasia of cardia: Secondary | ICD-10-CM | POA: Diagnosis not present

## 2021-02-17 DIAGNOSIS — G43711 Chronic migraine without aura, intractable, with status migrainosus: Secondary | ICD-10-CM

## 2021-02-17 NOTE — Progress Notes (Addendum)
Consent Form Botulism Toxin Injection For Chronic Migraine  02/17/2021: Stable  11/15/2020: doing excellent.+a.  08/16/2020: Still doing excellent. She had a difficult time over the summer with migraines. Now she is back to 1-2 migraines a month which is a success. Gave her 3 emgality samples if it helps we can give her more samples she had a difficult time getting it with her insurance. +a. 10 units in the masseters each and extra in the traps. Also nees  Later appointment   05/11/2020: She still has great success, "99%" improvement in frequency. When she doesn't have botox she starts getting headaches again which is what happened last time, we went 14 weeks in between treatments and she ended up in the ED last night, we will make sure to try and get botox scheduled every 12 weeks. +a. 10 units in the masseters each and extra in the traps. Also nees  Later appointment  Dry Needling for cervical myofascial pain and migraines: felt it helped with the muscles but triggered the migraines. She has had 2 of the injections Emgality. Will try Sprix and if not effective can also try limited toradol injections at home. They are losing several people this week due to covid.   No orders of the defined types were placed in this encounter.   Reviewed orally with patient, additionally signature is on file:  Botulism toxin has been approved by the Federal drug administration for treatment of chronic migraine. Botulism toxin does not cure chronic migraine and it may not be effective in some patients.  The administration of botulism toxin is accomplished by injecting a small amount of toxin into the muscles of the neck and head. Dosage must be titrated for each individual. Any benefits resulting from botulism toxin tend to wear off after 3 months with a repeat injection required if benefit is to be maintained. Injections are usually done every 3-4 months with maximum effect peak achieved by about 2 or 3 weeks.  Botulism toxin is expensive and you should be sure of what costs you will incur resulting from the injection.  The side effects of botulism toxin use for chronic migraine may include:   -Transient, and usually mild, facial weakness with facial injections  -Transient, and usually mild, head or neck weakness with head/neck injections  -Reduction or loss of forehead facial animation due to forehead muscle weakness  -Eyelid drooping  -Dry eye  -Pain at the site of injection or bruising at the site of injection  -Double vision  -Potential unknown long term risks  Contraindications: You should not have Botox if you are pregnant, nursing, allergic to albumin, have an infection, skin condition, or muscle weakness at the site of the injection, or have myasthenia gravis, Lambert-Eaton syndrome, or ALS.  It is also possible that as with any injection, there may be an allergic reaction or no effect from the medication. Reduced effectiveness after repeated injections is sometimes seen and rarely infection at the injection site may occur. All care will be taken to prevent these side effects. If therapy is given over a long time, atrophy and wasting in the muscle injected may occur. Occasionally the patient's become refractory to treatment because they develop antibodies to the toxin. In this event, therapy needs to be modified.  I have read the above information and consent to the administration of botulism toxin.    BOTOX PROCEDURE NOTE FOR MIGRAINE HEADACHE    Contraindications and precautions discussed with patient(above). Aseptic procedure was observed and  patient tolerated procedure. Procedure performed by Dr. Artemio Aly  The condition has existed for more than 6 months, and pt does not have a diagnosis of ALS, Myasthenia Gravis or Lambert-Eaton Syndrome.  Risks and benefits of injections discussed and pt agrees to proceed with the procedure.  Written consent obtained  These injections are  medically necessary. Pt  receives good benefits from these injections. These injections do not cause sedations or hallucinations which the oral therapies may cause.  Description of procedure:  The patient was placed in a sitting position. The standard protocol was used for Botox as follows, with 5 units of Botox injected at each site:   -Procerus muscle, midline injection  -Corrugator muscle, bilateral injection  -Frontalis muscle, bilateral injection, with 2 sites each side, medial injection was performed in the upper one third of the frontalis muscle, in the region vertical from the medial inferior edge of the superior orbital rim. The lateral injection was again in the upper one third of the forehead vertically above the lateral limbus of the cornea, 1.5 cm lateral to the medial injection site.  - Levator Scapulae: 5 units bilaterally  -Temporalis muscle injection, 5 sites, bilaterally. The first injection was 3 cm above the tragus of the ear, second injection site was 1.5 cm to 3 cm up from the first injection site in line with the tragus of the ear. The third injection site was 1.5-3 cm forward between the first 2 injection sites. The fourth injection site was 1.5 cm posterior to the second injection site. 5th site laterally in the temporalis  muscleat the level of the outer canthus.  - Patient feels her clenching is a trigger for headaches. +5 units masseter bilaterally   - Patient feels the migraines are centered around the eyes +5 units bilaterally at the outer canthus in the orbicularis occuli  -Occipitalis muscle injection, 3 sites, bilaterally. The first injection was done one half way between the occipital protuberance and the tip of the mastoid process behind the ear. The second injection site was done lateral and superior to the first, 1 fingerbreadth from the first injection. The third injection site was 1 fingerbreadth superiorly and medially from the first injection  site.  -Cervical paraspinal muscle injection, 2 sites, bilateral knee first injection site was 1 cm from the midline of the cervical spine, 3 cm inferior to the lower border of the occipital protuberance. The second injection site was 1.5 cm superiorly and laterally to the first injection site.  -Trapezius muscle injection was performed at 3 sites, bilaterally. The first injection site was in the upper trapezius muscle halfway between the inflection point of the neck, and the acromion. The second injection site was one half way between the acromion and the first injection site. The third injection was done between the first injection site and the inflection point of the neck.   Will return for repeat injection in 3 months.   155 unit sof Botox was used, 45U Botox not injected was wasted. The patient tolerated the procedure well, there were no complications of the above procedure.

## 2021-02-17 NOTE — Progress Notes (Signed)
Botox- 200 units x 1 vial Lot: [id number] Expiration: 08/2023 NDC: 4136-4383-77  Bacteriostatic 0.9% Sodium Chloride- 58mL total Lot: [id number] Expiration: 02/23 NDC: 4847-2072-18  Dx: C88.337 B/B

## 2021-02-21 ENCOUNTER — Ambulatory Visit: Payer: 59 | Admitting: Neurology

## 2021-02-22 ENCOUNTER — Ambulatory Visit: Payer: 59 | Admitting: Neurology

## 2021-02-27 DIAGNOSIS — Z0000 Encounter for general adult medical examination without abnormal findings: Secondary | ICD-10-CM | POA: Diagnosis not present

## 2021-03-06 ENCOUNTER — Encounter: Payer: Self-pay | Admitting: Urology

## 2021-03-06 ENCOUNTER — Other Ambulatory Visit (HOSPITAL_COMMUNITY): Payer: Self-pay

## 2021-03-06 ENCOUNTER — Encounter: Payer: Self-pay | Admitting: Family Medicine

## 2021-03-06 ENCOUNTER — Ambulatory Visit (INDEPENDENT_AMBULATORY_CARE_PROVIDER_SITE_OTHER): Payer: 59 | Admitting: Urology

## 2021-03-06 ENCOUNTER — Ambulatory Visit: Payer: 59 | Admitting: Family Medicine

## 2021-03-06 ENCOUNTER — Other Ambulatory Visit: Payer: Self-pay

## 2021-03-06 VITALS — BP 112/74 | HR 64 | Ht 64.0 in | Wt 148.0 lb

## 2021-03-06 DIAGNOSIS — N3946 Mixed incontinence: Secondary | ICD-10-CM

## 2021-03-06 LAB — URINALYSIS, COMPLETE
Bilirubin, UA: NEGATIVE
Glucose, UA: NEGATIVE
Ketones, UA: NEGATIVE
Leukocytes,UA: NEGATIVE
Nitrite, UA: NEGATIVE
Protein,UA: NEGATIVE
Specific Gravity, UA: 1.02 (ref 1.005–1.030)
Urobilinogen, Ur: 0.2 mg/dL (ref 0.2–1.0)
pH, UA: 7 (ref 5.0–7.5)

## 2021-03-06 LAB — MICROSCOPIC EXAMINATION: Bacteria, UA: NONE SEEN

## 2021-03-06 LAB — BLADDER SCAN AMB NON-IMAGING: Scan Result: 0

## 2021-03-06 MED ORDER — VIBEGRON 75 MG PO TABS
75.0000 mg | ORAL_TABLET | Freq: Every day | ORAL | 11 refills | Status: DC
  Filled 2021-03-06 – 2021-03-14 (×2): qty 30, 30d supply, fill #0

## 2021-03-06 NOTE — Patient Instructions (Signed)
Cystoscopy Cystoscopy is a procedure that is used to help diagnose and sometimes treat conditions that affect the lower urinary tract. The lower urinary tract includes the bladder and the urethra. The urethra is the tube that drains urine from the bladder. Cystoscopy is done using a thin, tube-shaped instrument with a light and camera at the end (cystoscope). The cystoscope may be hard or flexible, depending on the goal of the procedure. The cystoscope is inserted through the urethra, into the bladder. Cystoscopy may be recommended if you have: Urinary tract infections that keep coming back. Blood in the urine (hematuria). An inability to control when you urinate (urinary incontinence) or an overactive bladder. Unusual cells found in a urine sample. A blockage in the urethra, such as a urinary stone. Painful urination. An abnormality in the bladder found during an intravenous pyelogram (IVP) or CT scan. Cystoscopy may also be done to remove a sample of tissue to be examined under a microscope (biopsy). What are the risks? Generally, this is a safe procedure. However, problems may occur, including: Infection. Bleeding.  What happens during the procedure?  You will be given one or more of the following: A medicine to numb the area (local anesthetic). The area around the opening of your urethra will be cleaned. The cystoscope will be passed through your urethra into your bladder. Germ-free (sterile) fluid will flow through the cystoscope to fill your bladder. The fluid will stretch your bladder so that your health care provider can clearly examine your bladder walls. Your doctor will look at the urethra and bladder. The cystoscope will be removed The procedure may vary among health care providers  What can I expect after the procedure? After the procedure, it is common to have: Some soreness or pain in your abdomen and urethra. Urinary symptoms. These include: Mild pain or burning when you  urinate. Pain should stop within a few minutes after you urinate. This may last for up to 1 week. A small amount of blood in your urine for several days. Feeling like you need to urinate but producing only a small amount of urine. Follow these instructions at home: General instructions Return to your normal activities as told by your health care provider.  Do not drive for 24 hours if you were given a sedative during your procedure. Watch for any blood in your urine. If the amount of blood in your urine increases, call your health care provider. If a tissue sample was removed for testing (biopsy) during your procedure, it is up to you to get your test results. Ask your health care provider, or the department that is doing the test, when your results will be ready. Drink enough fluid to keep your urine pale yellow. Keep all follow-up visits as told by your health care provider. This is important. Contact a health care provider if you: Have pain that gets worse or does not get better with medicine, especially pain when you urinate. Have trouble urinating. Have more blood in your urine. Get help right away if you: Have blood clots in your urine. Have abdominal pain. Have a fever or chills. Are unable to urinate. Summary Cystoscopy is a procedure that is used to help diagnose and sometimes treat conditions that affect the lower urinary tract. Cystoscopy is done using a thin, tube-shaped instrument with a light and camera at the end. After the procedure, it is common to have some soreness or pain in your abdomen and urethra. Watch for any blood in your urine.   If the amount of blood in your urine increases, call your health care provider. If you were prescribed an antibiotic medicine, take it as told by your health care provider. Do not stop taking the antibiotic even if you start to feel better. This information is not intended to replace advice given to you by your health care provider. Make  sure you discuss any questions you have with your health care provider. Document Revised: 07/22/2018 Document Reviewed: 07/22/2018 Elsevier Patient Education  2020 Elsevier Inc.  

## 2021-03-06 NOTE — Addendum Note (Signed)
Addended by: Veneta Penton on: 03/06/2021 10:18 AM   Modules accepted: Orders

## 2021-03-06 NOTE — Progress Notes (Signed)
03/06/2021 9:24 AM   Michelle Rhodes 08-31-1980 [id number]  Referring provider: Imagene Riches, NP [place],  [place] [address]  Chief Complaint  Patient presents with   Urinary Incontinence    HPI: Saw Dr. Erlene Quan with mixed incontinence and cannot afford Myrbetriq and failed Toviaz.  She was given the new beta 3 agonist  Patient is a English as a second language teacher at [employer].  She leaks with coughing sneezing bending lifting.  She sometimes has urge incontinence.  No bedwetting.  Can use 5 pads a day that are damp.  She voids every 30 minutes and cannot hold it for 2 hours.  She would have difficulty holding it for 1 hour.  She goes frequently due to pressure and some suprapubic discomfort not necessarily relieved by voiding.  She has dyspareunia.  She gets up twice a Rhodes to urine  Gemtesa decreased leaking some but not her frequency or other symptoms.  Has not had a hysterectomy.  Prone to constipation.  No history of bladder surgery kidney stones or bladder infections.   PMH: Past Medical History:  Diagnosis Date   Anxiety    Asthma    as a child   Migraine     Surgical History: Past Surgical History:  Procedure Laterality Date   BREAST BIOPSY Left 2012   x 2    BREAST REDUCTION SURGERY     ENDOMETRIAL ABLATION N/A 01/06/2015   Procedure: ENDOMETRIAL ABLATION;  Surgeon: Aletha Halim, MD;  Location: [facility] ORS;  Service: Gynecology;  Laterality: N/A;   LAPAROSCOPIC BILATERAL SALPINGO OOPHERECTOMY Bilateral 01/06/2015   Procedure: LAPAROSCOPIC BILATERAL TUBAL LIGATION with FILSIE CLIPS ;  Surgeon: Aletha Halim, MD;  Location: [facility] ORS;  Service: Gynecology;  Laterality: Bilateral;    Home Medications:  Allergies as of 03/06/2021   No Active Allergies      Medication List        Accurate as of March 06, 2021  9:24 AM. If you have any questions, ask your nurse or doctor.          STOP taking these medications    Ketorolac Tromethamine 15.75  MG/SPRAY Soln Commonly known as: Sprix Stopped by: Reece Packer, MD   methylPREDNISolone 4 MG Tbpk tablet Commonly known as: MEDROL DOSEPAK Stopped by: Reece Packer, MD   Vibegron 75 MG Tabs Stopped by: Reece Packer, MD       TAKE these medications    Botox 100 units Solr injection Generic drug: botulinum toxin Type A PROVIDER TO INJECT 155 UNITS INTRAMUSCULARLY INTO HEAD AND NECK EVERY 3 MONTHS. DISCARD REMAINDER.   Carestart COVID-19 Home Test Kit Generic drug: COVID-19 At Home Antigen Test Use as directed   cyclobenzaprine 10 MG tablet Commonly known as: FLEXERIL Take 1 tablet (10 mg total) by mouth 3 (three) times daily as needed for muscle spasms.   rizatriptan 10 MG disintegrating tablet Commonly known as: MAXALT-MLT Dissolve 1 tablet (10 mg total) by mouth once as needed for migraine. May repeat in 2 hours if needed   SUMAtriptan 6 MG/0.5ML Soln injection Commonly known as: IMITREX Inject 0.5 mLs (6 mg total) into the skin every 2 (two) hours as needed for migraine or headache. Take one dose at headache onset, can take additional dose 2hrs later if needed. No more then 2 injections in 24hrs   Ubrelvy 100 MG Tabs Generic drug: Ubrogepant Take 100 mg by mouth every 2 (two) hours as needed. Maximum 259m a day.  Allergies: No Active Allergies  Family History: Family History  Problem Relation Age of Onset   Hypertension Mother    Stroke Mother    Hypertension Father    Colon cancer Paternal Grandfather    Migraines Neg Hx     Social History:  reports that she quit smoking about 4 years ago. Her smoking use included cigarettes. She has a 2.50 pack-year smoking history. She has never used smokeless tobacco. She reports that she does not drink alcohol and does not use drugs.  ROS:                                        Physical Exam: BP 112/74   Pulse 64   Ht _0  (1.626 m)   Wt 67.1 kg   BMI 25.40  kg/m   Constitutional:  Alert and oriented, No acute distress. HEENT: [place] AT, moist mucus membranes.  Trachea midline, no masses. Cardiovascular: No clubbing, cyanosis, or edema. Respiratory: Normal respiratory effort, no increased work of breathing. GI: Abdomen is soft, nontender, nondistended, no abdominal masses GU: No CVA tenderness.  Grade 2 hypermobility the bladder neck and negative cough test.  No prolapse.  No tenderness. Skin: No rashes, bruises or suspicious lesions. Lymph: No cervical or inguinal adenopathy. Neurologic: Grossly intact, no focal deficits, moving all 4 extremities. Psychiatric: Normal mood and affect.  Laboratory Data: Lab Results  Component Value Date   WBC 4.7 12/28/2014   HGB 13.2 12/28/2014   HCT 39.6 12/28/2014   MCV 96.9 12/28/2014   PLT 244 12/28/2014    Lab Results  Component Value Date   CREATININE 0.72 01/27/2015    No results found for: PSA  No results found for: TESTOSTERONE  No results found for: HGBA1C  Urinalysis    Component Value Date/Time   COLORURINE YELLOW 08/10/2012 2113   APPEARANCEUR Clear 11/29/2020 1449   LABSPEC 1.010 08/10/2012 2113   PHURINE 5.5 08/10/2012 2113   GLUCOSEU Negative 11/29/2020 [place] 08/10/2012 2113   BILIRUBINUR Negative 11/29/2020 [place] 08/10/2012 2113   PROTEINUR Negative 11/29/2020 [place] 08/10/2012 2113   UROBILINOGEN 0.2 11/01/2020 1429   UROBILINOGEN 0.2 08/10/2012 2113   NITRITE Negative 11/29/2020 1449   NITRITE NEGATIVE 08/10/2012 2113   LEUKOCYTESUR Trace (A) 11/29/2020 1449    Pertinent Imaging: Chart reviewed  Assessment & Plan: Patient has mixed incontinence.  She has significant frequency especially for a nurse.  She may have interstitial cystitis and I mention to her that she might have an inflamed bladder.  We will look for evidence of interstitial cystitis during urodynamics.  She will come back for safety cystoscopy.   She might need a hydrodistention in the future.  I would be concerned performing a sling in someone who might have interstitial cystitis and is voiding every 30 minutes.  I mention inflammation but it was not specific about the name of the condition or potential future hydrodistention. Patient wanted to stay on the [place]  1. Mixed urge and stress incontinence  - Bladder Scan (Post Void Residual) in office   No follow-ups on file.  Reece Packer, MD  [facility] [address] [place], [place] [address] [phone]

## 2021-03-06 NOTE — Progress Notes (Deleted)
PATIENT: Michelle Rhodes DOB: [birth year]  REASON FOR VISIT: follow up HISTORY FROM: patient  No chief complaint on file.    HISTORY OF PRESENT ILLNESS: 03/06/2021 ALL: Mallie returns for follow up for migraines. She has continues Botox and Ajovy.   03/03/2020 ALL:  Michelle Rhodes is a 40 y.o. female here today for follow up for headaches. She reports that over the past 4 weeks, headaches are worsening. She describes a burning sensation of the left side of her head and neck. Usually headache last hours but can be shorter lived. She is nauseated and sometimes vomits. She has blurry vision with headache. Headache usually starts at night and can wake her from her sleep. Ice seems to help. Rizatriptan helps sometimes but does not take pain away. She was prescribed sumatriptan injections but has not started these. She stopped taking topiramate this week. She was concerned about tingling and weight loss. Nothing else has changed. No snoring. She does have TMJ. She is getting massages and dry needling.    HISTORY: (copied from  note on 04/21/2018)  Interval history 04/21/2018: Migraines continue to be severe. Daily headaches. 15 migraine days a month. 4 can be severe and the rest are moderately severe. Last 24 hours. No aura. No medication overuse. Ongoing at this frequency for > year. Failed multiple medications. migraines last 24 - 72 hours. Migraines are unilateral, pounding/pulsating/throbbing, +photo/phonophobia, nausea, vomiting, severe, passes out due to the pain, movement makes it worse.    Tried and failed in the past: Topiramate, Gabapentin, Topiramate, Imitrex, Maxalt, zomig, zofran, flexeril, Zoloft, amitriptyline, sertraline, propranolol     Interval history 12/11/2016: She is having episodes of near-syncope. And she has left arm numbness and weakness which continues. She has a FHX of stroke in her mother in her 29s. She has neck pain. Her migraines have improved but she still gets  headaches. The left arm numbness and weakness has been persistent and ongoing for several years, recently worsened with noticeable weakness in that arm. She has continued eye blurriness. With the pre-syncopal episodes vision in both eyes can be affected and decreased with complete vision loss bilaterally but no alteration of awareness or consciousness; she can hear things around her but has difficulty comprehending what is being said, no urination or tongue biting.    Migraine medications tried: Gabapentin, Topiramate, Imitrex, Maxalt, zomig, zofran, flexeril, Zoloft   Interval history 07/03/2016:  She has not been compliant on the medication. She did not take the topamax or the Triptan. Had a long discussion about compliance, restarting the medication. She says she is very motivated. Discussed side effects again, other choices, migraine management. Decided to restart Topiramate as well as continue the maxalt and flexeril   Interval history 01/12/2016: Haven't seen her in a year. She has been to the ED multiple times. She has left-sided weakness and numbness with the headaches. Given cocktail of Decadron, Toradol, Reglan and Benadryl with improvement. Imitrex not working, will change to Zomig nasal spray.  I ordered an MRi of the brain and cervical spine last year to follow abnormal white matter changes on MRI, she did not have it completed and I reminded her today she will have the brain done. She has had the migraine for 4 weeks now. It is every year around time. For 30 days she has had a migraine, she has had 2 good days. She wakes up with headaches. The hedaches are worsening with vision changes. She has multiple a week, can  be severe, has had the last headache for 4 weeks.    She has weekly headaches 2-3 a week. She takes tylenol a lot. She has tension around the eye. Migraines ar eon the left. Lights bother her, make her feel dizzy. She also has bad migraines 1-2x a year but they can be severe and last  30 days. She has neck pain, tightness.   HPI:  Michelle Rhodes is a 40 y.o. female here as a referral from Dr. Bari Edward for neck pain and numbness, migraines.   Migraines started at the age of 59. Migraines are worsening, always on the left side side, pulsating and throbbing, light sensitivity, sound sensitivity, 10/10 pain, they last all day and have lasted 28 days. She has to go into a dark room. She gets neck pain on the left side. She has tingling in the left hand which radiates  up the arm. Migraines are once a year and can last a month. Last migraine 2 weeks ago for about 2 days, last extended migraine last July. Takes imitrex which doesn't work. She has been to the emergency room in the past but otherwise just sits in her room for 30 days. No aura.    Left arm: She has tingling of the left fingers. Happens sporadically. She wakes up with numbness in the hand all the time. No grip weakness. She has tightness in the neck but no radicular symptoms.    Reviewed notes, labs and imaging from outside physicians, which showed:      IMPRESSIO MRI of the brain (personally reviewed images) : 1.  Stable nonspecific white matter signal changes without enhancement in the anterior frontal lobe.  Top differential considerations remain sequelae of migraines, demyelination, and trauma.  See comparison for additional diagnostic considerations. 2.  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  Recommend 1 year follow up to document stability.    REVIEW OF SYSTEMS: Out of a complete 14 system review of symptoms, the patient complains only of the following symptoms, headaches and all other reviewed systems are negative.  ALLERGIES: No Active Allergies   HOME MEDICATIONS: Outpatient Medications Prior to Visit  Medication Sig Dispense Refill   botulinum toxin Type A (BOTOX) 100 units SOLR injection PROVIDER TO INJECT 155 UNITS INTRAMUSCULARLY INTO HEAD AND NECK EVERY 3 MONTHS. DISCARD REMAINDER. 2 each  0   COVID-19 At Home Antigen Test (CARESTART COVID-19 HOME TEST) KIT Use as directed 4 each 0   cyclobenzaprine (FLEXERIL) 10 MG tablet Take 1 tablet (10 mg total) by mouth 3 (three) times daily as needed for muscle spasms. 90 tablet 11   Ketorolac Tromethamine (SPRIX) 15.75 MG/SPRAY SOLN 1 spray in each nostril every 6-8 hours up to 4 times a day. 6 each 6   methylPREDNISolone (MEDROL DOSEPAK) 4 MG TBPK tablet Taper pack as directed. 21 tablet 1   rizatriptan (MAXALT-MLT) 10 MG disintegrating tablet Dissolve 1 tablet (10 mg total) by mouth once as needed for migraine. May repeat in 2 hours if needed 18 tablet 11   SUMAtriptan (IMITREX) 6 MG/0.5ML SOLN injection Inject 0.5 mLs (6 mg total) into the skin every 2 (two) hours as needed for migraine or headache. Take one dose at headache onset, can take additional dose 2hrs later if needed. No more then 2 injections in 24hrs 6 mL 6   Ubrogepant (UBRELVY) 100 MG TABS Take 100 mg by mouth every 2 (two) hours as needed. Maximum 275m a day. 16 tablet 0  Vibegron 75 MG TABS Take 75 mg by mouth daily. 30 tablet 0   No facility-administered medications prior to visit.    PAST MEDICAL HISTORY: Past Medical History:  Diagnosis Date   Anxiety    Asthma    as a child   Migraine     PAST SURGICAL HISTORY: Past Surgical History:  Procedure Laterality Date   BREAST BIOPSY Left 2012   x 2    BREAST REDUCTION SURGERY     ENDOMETRIAL ABLATION N/A 01/06/2015   Procedure: ENDOMETRIAL ABLATION;  Surgeon: Aletha Halim, MD;  Location: [facility] ORS;  Service: Gynecology;  Laterality: N/A;   LAPAROSCOPIC BILATERAL SALPINGO OOPHERECTOMY Bilateral 01/06/2015   Procedure: LAPAROSCOPIC BILATERAL TUBAL LIGATION with FILSIE CLIPS ;  Surgeon: Aletha Halim, MD;  Location: [facility] ORS;  Service: Gynecology;  Laterality: Bilateral;    FAMILY HISTORY: Family History  Problem Relation Age of Onset   Hypertension Mother    Stroke Mother    Hypertension Father    Colon  cancer Paternal Grandfather    Migraines Neg Hx     SOCIAL HISTORY: Social History   Socioeconomic History   Marital status: Single    Spouse name: Not on file   Number of children: 1   Years of education: 12   Highest education level: Not on file  Occupational History   Not on file  Tobacco Use   Smoking status: Former    Packs/day: 0.25    Years: 10.00    Pack years: 2.50    Types: Cigarettes    Quit date: 11/11/2016    Years since quitting: 4.3   Smokeless tobacco: Never  Vaping Use   Vaping Use: Every day  Substance and Sexual Activity   Alcohol use: No   Drug use: No   Sexual activity: Yes    Birth control/protection: Surgical  Other Topics Concern   Not on file  Social History Narrative   Lives at home with son.   Caffeine use: tea occasionally with dinner   Right handed   Social Determinants of Health   Financial Resource Strain: Not on file  Food Insecurity: Not on file  Transportation Needs: Not on file  Physical Activity: Not on file  Stress: Not on file  Social Connections: Not on file  Intimate Partner Violence: Not on file      PHYSICAL EXAM  There were no vitals filed for this visit.  There is no height or weight on file to calculate BMI.  Generalized: Well developed, in no acute distress  Cardiology: normal rate and rhythm, no murmur noted Respiratory: clear to auscultation bilaterally  Neurological examination  Mentation: Alert oriented to time, place, history taking. Follows all commands speech and language fluent Cranial nerve II-XII: Pupils were equal round reactive to light. Extraocular movements were full, visual field were full Motor: The motor testing reveals 5 over 5 strength of all 4 extremities. Good symmetric motor tone is noted throughout.  Gait and station: Gait is normal.   DIAGNOSTIC DATA (LABS, IMAGING, TESTING) - I reviewed patient records, labs, notes, testing and imaging myself where available.  No flowsheet data  found.   Lab Results  Component Value Date   WBC 4.7 12/28/2014   HGB 13.2 12/28/2014   HCT 39.6 12/28/2014   MCV 96.9 12/28/2014   PLT 244 12/28/2014      Component Value Date/Time   NA 140 01/27/2015 1028   NA 138 01/22/2012 1319   K 4.1 01/27/2015 1028  K 3.9 01/22/2012 1319   CL 102 01/27/2015 1028   CL 105 01/22/2012 1319   CO2 19 01/27/2015 1028   CO2 27 01/22/2012 1319   GLUCOSE 83 01/27/2015 1028   GLUCOSE 104 (H) 01/22/2012 1319   BUN 13 01/27/2015 1028   BUN 9 01/22/2012 1319   CREATININE 0.72 01/27/2015 1028   CREATININE 0.64 01/22/2012 1319   CALCIUM 9.8 01/27/2015 1028   CALCIUM 9.3 01/22/2012 1319   PROT 6.7 01/27/2015 1028   ALBUMIN 4.7 01/27/2015 1028   AST 15 01/27/2015 1028   ALT 8 01/27/2015 1028   ALKPHOS 43 01/27/2015 1028   BILITOT 0.2 01/27/2015 1028   GFRNONAA 111 01/27/2015 1028   GFRNONAA >60 01/22/2012 1319   GFRAA 128 01/27/2015 1028   GFRAA >60 01/22/2012 1319   No results found for: CHOL, HDL, LDLCALC, LDLDIRECT, TRIG, CHOLHDL No results found for: HGBA1C No results found for: VITAMINB12 No results found for: TSH     ASSESSMENT AND PLAN 40 y.o. year old female  has a past medical history of Anxiety, Asthma, and Migraine. here with   No diagnosis found.   Cambelle reports worsening of headaches over the past month. She has not been able to contribute worsening to any particular trigger but states she isn't sleeping well and has a history of TMJ. She will continue massage therapy and dry needling. I will start her on medrol taper pack for abortive therpay. I have encouraged her to use her sumatriptan as well. She will continue Botox as advised. We have talked about adding Emgality in future if headaches continue to worsen. She was encouraged to focus on healthy lifestyle habits. She will follow up in 1 year if headaches resolve, sooner if needed. Botox every 12 weeks.    No orders of the defined types were placed in this encounter.     No orders of the defined types were placed in this encounter.    Debbora Presto, FNP-C 03/06/2021, 9:06 AM [facility] [address] [place], [place] [address] [phone]

## 2021-03-07 ENCOUNTER — Telehealth: Payer: Self-pay

## 2021-03-07 NOTE — Telephone Encounter (Signed)
PA pending for Gemteza through cover my meds. KEY : [id number]

## 2021-03-14 ENCOUNTER — Encounter: Payer: Self-pay | Admitting: Urology

## 2021-03-14 ENCOUNTER — Other Ambulatory Visit (HOSPITAL_COMMUNITY): Payer: Self-pay

## 2021-03-15 ENCOUNTER — Other Ambulatory Visit (HOSPITAL_COMMUNITY): Payer: Self-pay

## 2021-03-15 ENCOUNTER — Telehealth: Payer: Self-pay

## 2021-03-15 MED ORDER — OXYBUTYNIN CHLORIDE ER 10 MG PO TB24
10.0000 mg | ORAL_TABLET | Freq: Every day | ORAL | 11 refills | Status: DC
  Filled 2021-03-15: qty 30, 30d supply, fill #0

## 2021-03-15 NOTE — Telephone Encounter (Signed)
As per Dr. Sherron Monday send in oxybutynin. Sent to pharm. Sent mychart message to patient.

## 2021-03-15 NOTE — Telephone Encounter (Signed)
Insurance company will not approve [employer]. Stating she needs to try oxybutynin first.

## 2021-03-16 ENCOUNTER — Other Ambulatory Visit (HOSPITAL_COMMUNITY): Payer: Self-pay

## 2021-03-20 DIAGNOSIS — H5203 Hypermetropia, bilateral: Secondary | ICD-10-CM | POA: Diagnosis not present

## 2021-03-23 ENCOUNTER — Other Ambulatory Visit (HOSPITAL_COMMUNITY): Payer: Self-pay

## 2021-04-03 ENCOUNTER — Other Ambulatory Visit: Payer: Self-pay | Admitting: Urology

## 2021-04-18 ENCOUNTER — Telehealth: Payer: Self-pay | Admitting: *Deleted

## 2021-04-18 DIAGNOSIS — Z0289 Encounter for other administrative examinations: Secondary | ICD-10-CM

## 2021-04-18 NOTE — Telephone Encounter (Signed)
[employer] FMALA form completed . To Dr. Lucia Gaskins for review and signature. Placed in Dr.Ahern inbox

## 2021-04-18 NOTE — Telephone Encounter (Signed)
Pt matrix form in nurse pod

## 2021-04-18 NOTE — Telephone Encounter (Signed)
Fmla received. We will work on this.

## 2021-04-19 ENCOUNTER — Telehealth: Payer: Self-pay | Admitting: *Deleted

## 2021-04-19 NOTE — Telephone Encounter (Signed)
I faxed pt [employer] fmla form on 04/19/2021

## 2021-04-19 NOTE — Telephone Encounter (Signed)
FMLA form signed By Dr.Ahern.Made a copy of FMLA and send back to  medical records

## 2021-05-08 ENCOUNTER — Ambulatory Visit: Payer: 59 | Admitting: Urology

## 2021-05-10 ENCOUNTER — Telehealth: Payer: Self-pay | Admitting: Neurology

## 2021-05-10 NOTE — Telephone Encounter (Signed)
I called [facility] to obtain PA for patient's Botox appointment on 10/12. Phone: [phone]. I spoke with Bonita Quin. She was able to initiate PA for CPT J0585. She states CPT [phone] does not require authorization. 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 Bonita Quin advises that clinical information will need to be faxed to [phone] for review. The request is pending. Reference #[id number].

## 2021-05-16 NOTE — Telephone Encounter (Signed)
Received approval from [facility].  PA #[id number] (05/24/21- 05/23/22).

## 2021-05-24 ENCOUNTER — Encounter: Payer: Self-pay | Admitting: Neurology

## 2021-05-24 ENCOUNTER — Other Ambulatory Visit: Payer: Self-pay

## 2021-05-24 ENCOUNTER — Ambulatory Visit (INDEPENDENT_AMBULATORY_CARE_PROVIDER_SITE_OTHER): Payer: 59 | Admitting: Neurology

## 2021-05-24 VITALS — Ht 64.0 in

## 2021-05-24 DIAGNOSIS — G43711 Chronic migraine without aura, intractable, with status migrainosus: Secondary | ICD-10-CM | POA: Diagnosis not present

## 2021-05-24 NOTE — Progress Notes (Signed)
Consent Form Botulism Toxin Injection For Chronic Migraine  Prescribe ajovy May need dry needling referral   05/27/2021: Stable, still excellent response, > 80% improvement with freg of migraines  No orders of the defined types were placed in this encounter.   Reviewed orally with patient, additionally signature is on file:  Botulism toxin has been approved by the Federal drug administration for treatment of chronic migraine. Botulism toxin does not cure chronic migraine and it may not be effective in some patients.  The administration of botulism toxin is accomplished by injecting a small amount of toxin into the muscles of the neck and head. Dosage must be titrated for each individual. Any benefits resulting from botulism toxin tend to wear off after 3 months with a repeat injection required if benefit is to be maintained. Injections are usually done every 3-4 months with maximum effect peak achieved by about 2 or 3 weeks. Botulism toxin is expensive and you should be sure of what costs you will incur resulting from the injection.  The side effects of botulism toxin use for chronic migraine may include:   -Transient, and usually mild, facial weakness with facial injections  -Transient, and usually mild, head or neck weakness with head/neck injections  -Reduction or loss of forehead facial animation due to forehead muscle weakness  -Eyelid drooping  -Dry eye  -Pain at the site of injection or bruising at the site of injection  -Double vision  -Potential unknown long term risks  Contraindications: You should not have Botox if you are pregnant, nursing, allergic to albumin, have an infection, skin condition, or muscle weakness at the site of the injection, or have myasthenia gravis, Lambert-Eaton syndrome, or ALS.  It is also possible that as with any injection, there may be an allergic reaction or no effect from the medication. Reduced effectiveness after repeated injections is  sometimes seen and rarely infection at the injection site may occur. All care will be taken to prevent these side effects. If therapy is given over a long time, atrophy and wasting in the muscle injected may occur. Occasionally the patient's become refractory to treatment because they develop antibodies to the toxin. In this event, therapy needs to be modified.  I have read the above information and consent to the administration of botulism toxin.    BOTOX PROCEDURE NOTE FOR MIGRAINE HEADACHE    Contraindications and precautions discussed with patient(above). Aseptic procedure was observed and patient tolerated procedure. Procedure performed by Dr. Artemio Aly  The condition has existed for more than 6 months, and pt does not have a diagnosis of ALS, Myasthenia Gravis or Lambert-Eaton Syndrome.  Risks and benefits of injections discussed and pt agrees to proceed with the procedure.  Written consent obtained  These injections are medically necessary. Pt  receives good benefits from these injections. These injections do not cause sedations or hallucinations which the oral therapies may cause.  Description of procedure:  The patient was placed in a sitting position. The standard protocol was used for Botox as follows, with 5 units of Botox injected at each site:   -Procerus muscle, midline injection  -Corrugator muscle, bilateral injection  -Frontalis muscle, bilateral injection, with 2 sites each side, medial injection was performed in the upper one third of the frontalis muscle, in the region vertical from the medial inferior edge of the superior orbital rim. The lateral injection was again in the upper one third of the forehead vertically above the lateral limbus of the cornea, 1.5  cm lateral to the medial injection site.  - Levator Scapulae: 5 units bilaterally  -Temporalis muscle injection, 5 sites, bilaterally. The first injection was 3 cm above the tragus of the ear, second injection  site was 1.5 cm to 3 cm up from the first injection site in line with the tragus of the ear. The third injection site was 1.5-3 cm forward between the first 2 injection sites. The fourth injection site was 1.5 cm posterior to the second injection site. 5th site laterally in the temporalis  muscleat the level of the outer canthus.  - Patient feels her clenching is a trigger for headaches. +5 units masseter bilaterally   - Patient feels the migraines are centered around the eyes +5 units bilaterally at the outer canthus in the orbicularis occuli  -Occipitalis muscle injection, 3 sites, bilaterally. The first injection was done one half way between the occipital protuberance and the tip of the mastoid process behind the ear. The second injection site was done lateral and superior to the first, 1 fingerbreadth from the first injection. The third injection site was 1 fingerbreadth superiorly and medially from the first injection site.  -Cervical paraspinal muscle injection, 2 sites, bilateral knee first injection site was 1 cm from the midline of the cervical spine, 3 cm inferior to the lower border of the occipital protuberance. The second injection site was 1.5 cm superiorly and laterally to the first injection site.  -Trapezius muscle injection was performed at 3 sites, bilaterally. The first injection site was in the upper trapezius muscle halfway between the inflection point of the neck, and the acromion. The second injection site was one half way between the acromion and the first injection site. The third injection was done between the first injection site and the inflection point of the neck.   Will return for repeat injection in 3 months.   155 unit sof Botox was used, 45U Botox not injected was wasted. The patient tolerated the procedure well, there were no complications of the above procedure.

## 2021-05-24 NOTE — Progress Notes (Signed)
Botox- 200 units x 1 vial Lot: [id number] Expiration: 11/2022 DC: 4818-5909-31  Bacteriostatic 0.9% Sodium Chloride- 75mL total Lot: [id number] Expiration: 08/13/2022 NDC: 4695-0722-57  Dx: D05.183 B/B

## 2021-05-29 ENCOUNTER — Encounter: Payer: Self-pay | Admitting: Urology

## 2021-05-29 ENCOUNTER — Ambulatory Visit: Payer: 59 | Admitting: Urology

## 2021-06-06 ENCOUNTER — Telehealth: Payer: Self-pay | Admitting: Neurology

## 2021-06-06 DIAGNOSIS — G43711 Chronic migraine without aura, intractable, with status migrainosus: Secondary | ICD-10-CM

## 2021-06-06 DIAGNOSIS — M7918 Myalgia, other site: Secondary | ICD-10-CM

## 2021-06-06 NOTE — Addendum Note (Signed)
Addended by: Bertram Savin on: 06/06/2021 02:01 PM   Modules accepted: Orders

## 2021-06-06 NOTE — Telephone Encounter (Signed)
Pt called asking about a referral for physical therapy. [facility] fax number [phone].

## 2021-06-06 NOTE — Telephone Encounter (Signed)
Referral order placed.

## 2021-06-08 ENCOUNTER — Other Ambulatory Visit (HOSPITAL_COMMUNITY): Payer: Self-pay

## 2021-06-08 MED ORDER — XOFLUZA (40 MG DOSE) 1 X 40 MG PO TBPK
40.0000 mg | ORAL_TABLET | Freq: Once | ORAL | 0 refills | Status: AC
  Filled 2021-06-08 – 2021-06-09 (×2): qty 1, 1d supply, fill #0

## 2021-06-09 ENCOUNTER — Other Ambulatory Visit (HOSPITAL_COMMUNITY): Payer: Self-pay

## 2021-06-09 MED ORDER — OSELTAMIVIR PHOSPHATE 75 MG PO CAPS
75.0000 mg | ORAL_CAPSULE | Freq: Two times a day (BID) | ORAL | 0 refills | Status: DC
  Filled 2021-06-09: qty 10, 5d supply, fill #0

## 2021-06-20 DIAGNOSIS — Z760 Encounter for issue of repeat prescription: Secondary | ICD-10-CM | POA: Diagnosis not present

## 2021-06-29 ENCOUNTER — Other Ambulatory Visit (HOSPITAL_COMMUNITY): Payer: Self-pay

## 2021-06-29 MED ORDER — OSELTAMIVIR PHOSPHATE 75 MG PO CAPS
75.0000 mg | ORAL_CAPSULE | Freq: Two times a day (BID) | ORAL | 0 refills | Status: DC
  Filled 2021-06-29: qty 10, 5d supply, fill #0

## 2021-07-05 ENCOUNTER — Ambulatory Visit: Payer: 59 | Attending: Neurology

## 2021-07-05 ENCOUNTER — Other Ambulatory Visit: Payer: Self-pay

## 2021-07-05 DIAGNOSIS — G43711 Chronic migraine without aura, intractable, with status migrainosus: Secondary | ICD-10-CM

## 2021-07-05 DIAGNOSIS — M7918 Myalgia, other site: Secondary | ICD-10-CM | POA: Diagnosis not present

## 2021-07-05 DIAGNOSIS — M62838 Other muscle spasm: Secondary | ICD-10-CM

## 2021-07-05 DIAGNOSIS — R29898 Other symptoms and signs involving the musculoskeletal system: Secondary | ICD-10-CM | POA: Diagnosis not present

## 2021-07-05 NOTE — Therapy (Addendum)
[place], [place], [address] Phone: [phone]   Fax:  [phone]  Physical Therapy Treatment  Patient Details  Name: Michelle Rhodes MRN: [id number] Date of Birth: Aug 28, 1980 Referring Provider (PT): Melvenia Beam, MD   Encounter Date: 07/05/2021   PT End of Session - 07/05/21 2253     Visit Number 1    Number of Visits 7    Date for PT Re-Evaluation 08/26/21    Authorization Type [employer]    Authorization Time Period Re-assess FOTO on 6th ad last visit    PT Start Time 1506    PT Stop Time 1555    PT Time Calculation (min) 49 min    Activity Tolerance Patient tolerated treatment well    Behavior During Therapy [facility] for tasks assessed/performed             Past Medical History:  Diagnosis Date   Anxiety    Asthma    as a child   Migraine     Past Surgical History:  Procedure Laterality Date   BREAST BIOPSY Left 2012   x 2    BREAST REDUCTION SURGERY     ENDOMETRIAL ABLATION N/A 01/06/2015   Procedure: ENDOMETRIAL ABLATION;  Surgeon: Aletha Halim, MD;  Location: [facility] ORS;  Service: Gynecology;  Laterality: N/A;   LAPAROSCOPIC BILATERAL SALPINGO OOPHERECTOMY Bilateral 01/06/2015   Procedure: LAPAROSCOPIC BILATERAL TUBAL LIGATION with FILSIE CLIPS ;  Surgeon: Aletha Halim, MD;  Location: [facility] ORS;  Service: Gynecology;  Laterality: Bilateral;    There were no vitals filed for this visit.   Subjective Assessment - 07/05/21 1523     Subjective Pt reports she has experienced neck tightness/pain and migraine HAs since a teenager. This lastest episode has been bothering her progressively over the last 3 months. She notes the sensation is more of extreme tightness and discomfort than pain which is wrose at the end of the day. Pt reports she has had TPDN in the past and has found it to be helpful, pt notes nigraine HAs are not bothering her at this time, but she does have HAs  daily.    Pertinent History Anxiety, migraines    Patient Stated Goals To relieve cervical and upper shoulder tightness    Currently in Pain? Yes    Pain Score 7    range from 4-10/10   Pain Location Neck   upper shoulders   Pain Orientation Posterior    Pain Descriptors / Indicators Sore;Tightness    Pain Type Chronic pain    Pain Onset More than a month ago    Pain Frequency Constant    Aggravating Factors  Tightness increases throughout the day    Multiple Pain Sites No                [facility] PT Assessment - 07/05/21 0001       Assessment   Medical Diagnosis Cervical myofascial pain syndrome  G43.711 (ICD-10-CM) - Chronic migraine without aura, with intractable migraine, so stated, with status migrainosus    Referring Provider (PT) Melvenia Beam, MD    Onset Date/Surgical Date --   Since a teenager, more recently the last 3 months   Hand Dominance Right    Prior Therapy not for this most recent episode      Precautions   Precautions None      Restrictions   Weight Bearing Restrictions No      Balance Screen  Has the patient fallen in the past 6 months No      Home Environment   Additional Comments No issue with accessing her home or mobilitywithin home      Prior Function   Level of Independence Independent    Vocation Full time employment;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 Predicted is 57%by DC      Sensation   Light Touch Appears Intact      Posture/Postural Control   Posture/Postural Control --    Postural Limitations Forward head;Rounded Shoulders      ROM / Strength   AROM / PROM / Strength AROM;Strength      AROM   AROM Assessment Site Cervical    Cervical Flexion 54    Cervical Extension 35, provokes neck apin    Cervical - Right Side Bend  35, provokes L lateral neck tightness/disconfort    Cervical - Left Side Bend 40    Cervical - Right Rotation 70, provokes L lateral neck pain    Cervical - Left Rotation 73      Strength   Overall Strength Comments UE myotomal screen was negative      Palpation   Palpation comment TTP to the upper trap, levator, cervical and thoracic paraspinals, and rhombois L >R with marked increasd tightness L                                    PT Education - 07/05/21 2252     Education Details Eval findings, POC, HEP    Person(s) Educated Patient    Methods Explanation;Demonstration;Tactile cues;Verbal cues;Handout    Comprehension Verbalized understanding;Returned demonstration;Verbal cues required;Tactile cues required;Need further instruction              PT Short Term Goals - 07/05/21 2334       PT SHORT TERM GOAL #1   Title Pt will be Ind in a HEP    Status New    Target Date 07/26/21      PT SHORT TERM GOAL #2   Title Pt will voice understanding of measures to assist in the management of cervical/upper shoulder tightness/discomfort             [facility] Adult PT Treatment/Exercise:  Therapeutic Exercise: - cervical retractions, 5x, 3 sec - upper trap stretch, 2x, 20 sec - levator stretch, 2x, 20 sec   Trigger Point Dry Needling Treatment: Pre-treatment instruction: Patient instructed on dry needling rationale, procedures, and possible side effects including pain during treatment (achy,cramping feeling), bruising, drop of blood, lightheadedness, nausea, sweating. Patient Consent Given: Yes Education handout provided: Yes Muscles treated: R upper trap and levator Needle size and number:  .3x5m, 1 Electrical stimulation performed: No Parameters: N/A Treatment response/outcome: Twitch response elicited and Palpable decrease in muscle tension Post-treatment instructions: Patient instructed to expect possible mild to moderate muscle soreness later  today and/or tomorrow. Patient instructed in methods to reduce muscle soreness and to continue prescribed HEP. If patient was dry needled over the lung field, patient was instructed on signs and symptoms of pneumothorax and, however unlikely, to see immediate medical attention should they occur. Patient was also educated on signs and symptoms of infection and to seek medical  attention should they occur. Patient verbalized understanding of these instructions and education.        PT Long Term Goals - 07/05/21 2337       PT LONG TERM GOAL #1   Title Pt will be Ind in a final HEP to maintain achieved LOF    Status New    Target Date 08/26/21      PT LONG TERM GOAL #2   Title Pt will report 4/10 or less tightness/discomfort fot eh cervical/upper shoulder will daily and work related activities.    Baseline 4-10/10    Status New    Target Date 08/26/21      PT LONG TERM GOAL #3   Title Improve cervical ext and R side bending by 5d for improved mobility and function    Baseline ext 35d, R side bending 35d    Status New    Target Date 08/26/21      PT LONG TERM GOAL #4   Title Pt will be able to demonstrate proper sitting psoture without verbal cueing    Status New    Target Date 08/26/21                   Plan - 07/05/21 2255     Clinical Impression Statement Pt presents with a chronic Hx of cervical upper shoulder muscle tighness/discomfort and HAs including migraines. Deficits include forward head and rounded shoulders posture, decreased cervical ROM for ext and R side bending, and marked muscle tightness of her cervical/thoracic paraspinals, upper trap, levator, and rhomboids. Pt was initiated on a HEP to address posture and cervical ROM/tightness. Also TPDN was provided to the R upper trap and levator. PT will benefit from skilled PT to address deficits to decrease tightness/discomfort and HAs, improve posture and cervical ROM for improved function of the neck/UB and QOL.     Personal Factors and Comorbidities Past/Current Experience;Time since onset of injury/illness/exacerbation;Comorbidity 2    Comorbidities anxiety, migraines    Examination-Activity Limitations Sleep    Examination-Participation Restrictions Occupation    Stability/Clinical Decision Making Evolving/Moderate complexity    Clinical Decision Making Moderate    Rehab Potential Good    PT Frequency 1x / week    PT Duration 8 weeks    PT Treatment/Interventions ADLs/Self Care Home Management;Cryotherapy;Electrical Stimulation;Ultrasound;Traction;Moist Heat;Iontophoresis 78m/ml Dexamethasone;Therapeutic exercise;Manual techniques;Patient/family education;Dry needling;Passive range of motion;Taping;Joint Manipulations    PT Next Visit Plan Assess response to TPDN and HEP. Assess HAs further    PT Home Exercise Plan [email or web address]    Consulted and Agree with Plan of Care Patient             Patient will benefit from skilled therapeutic intervention in order to improve the following deficits and impairments:  Impaired UE functional use, Decreased range of motion, Pain, Decreased activity tolerance, Postural dysfunction, Decreased strength  Visit Diagnosis: Cervical myofascial pain syndrome  Chronic migraine without aura, with intractable migraine, so stated, with status migrainosus  Neck muscle spasm  Decreased ROM of neck     Problem List Patient Active Problem List   Diagnosis Date Noted   Mixed urge and stress incontinence 11/01/2020   Breast mass, left 04/30/2017   White matter abnormality on MRI of brain 01/14/2015   Headache 01/14/2015   CTS (carpal tunnel syndrome) 01/13/2015   Musculoskeletal neck pain 01/13/2015   Chronic migraine without aura, with intractable migraine, so stated, with status migrainosus 01/13/2015    AGar PontoMS, PT 07/05/21 11:45 PM  PHYSICAL  THERAPY DISCHARGE SUMMARY  Visits from Start of Care: 1  Current functional level related to goals /  functional outcomes: unknown   Remaining deficits: unknown   Education / Equipment: HEP   Patient agrees to discharge. Patient goals were not met. Patient is being discharged due to not returning since the last visit.   Gar Ponto MS, PT 09/05/21 9:25 AM    [facility] [address] [place], [place], [address] Phone: [phone]   Fax:  [phone]  Name: DELIAH STREHLOW MRN: [id number] Date of Birth: 1981/02/02

## 2021-07-17 DIAGNOSIS — Z111 Encounter for screening for respiratory tuberculosis: Secondary | ICD-10-CM | POA: Diagnosis not present

## 2021-07-18 NOTE — Telephone Encounter (Signed)
This encounter was created in error - please disregard.

## 2021-07-19 ENCOUNTER — Telehealth: Payer: Self-pay | Admitting: Physical Therapy

## 2021-07-19 ENCOUNTER — Ambulatory Visit: Payer: 59 | Attending: Neurology | Admitting: Physical Therapy

## 2021-07-19 NOTE — Telephone Encounter (Signed)
Called and informed patient of missed visit and provided reminder of next appt and attendance policy.  

## 2021-07-26 ENCOUNTER — Ambulatory Visit: Payer: 59

## 2021-08-16 ENCOUNTER — Encounter: Payer: 59 | Admitting: Physical Therapy

## 2021-08-22 ENCOUNTER — Ambulatory Visit: Payer: 59 | Admitting: Neurology

## 2021-08-23 ENCOUNTER — Encounter: Payer: 59 | Admitting: Physical Therapy

## 2021-08-23 ENCOUNTER — Ambulatory Visit: Payer: Medicaid Other | Admitting: Neurology

## 2021-08-23 ENCOUNTER — Other Ambulatory Visit: Payer: Self-pay

## 2021-08-23 DIAGNOSIS — G43711 Chronic migraine without aura, intractable, with status migrainosus: Secondary | ICD-10-CM

## 2021-08-23 DIAGNOSIS — M542 Cervicalgia: Secondary | ICD-10-CM | POA: Diagnosis not present

## 2021-08-23 DIAGNOSIS — M7918 Myalgia, other site: Secondary | ICD-10-CM

## 2021-08-23 NOTE — Progress Notes (Signed)
Botox- 200 units x 1 vial °Lot: [id number] °Expiration: 04/2024 °NDC: 0023-3921-02 ° °Bacteriostatic 0.9% Sodium Chloride- 4mL total °Lot: [id number] °Expiration: 03/14/2023 °NDC: 0409-1966-02 ° °Dx: G43.711 °B/B ° °

## 2021-08-23 NOTE — Patient Instructions (Addendum)
[facility], [address] [place] [phone] Dr. Mayford Knife or Dr. Logan Bores they treat Migraines and Dr. Logan Bores specialty is migraines and neck pain (stim, laser heat therapy and adjustments).   Michelle Rhodes at [employer] sports medicine

## 2021-08-23 NOTE — Progress Notes (Signed)
Consent Form Botulism Toxin Injection For Chronic Migraine  08/23/2021: doing great as far migraines go but she continues to have cervical myofascial pain, musculoskletal neck pain, has tried dry needling, PT, muscle relaxers. Refer to specialist.. MRI cervical spine 2018 was normal. Personally reviewed images with patient (additional 10 minutes on personal review on CD in our office if no improvement recommend repeat MRI cervical spine worsening pain, decreased ROM, crepitus) MRI c-spine 2018: :  Metal artifact slightly obscures the upper anterior spine. On sagittal images, the spine is imaged from above the cervicomedullary junction to T2.   The spinal cord is of normal caliber and signal.   There is some straightening of the cervical curvature. No spondylolisthesis is noted..   The vertebral bodies have normal signal.  The discs and interspaces were further evaluated on axial views from C2 to T1.   The discs were normal at each of the cervical levels. There was no spondylosis. The neural foramina are widely patent at every level and there is no nerve root compression.After the infusion of contrast, a normal enhancement pattern was observed IMPRESSION:  This is a normal MRI of the cervical spine with and without contrast.  Orders Placed This Encounter  Procedures   Ambulatory referral to Chiropractic   AMB referral to sports medicine   I spent 20 minutes of face-to-face and non-face-to-face time with patient on the  1. Chronic migraine without aura, with intractable migraine, so stated, with status migrainosus   2. Cervical myofascial pain syndrome   3. Musculoskeletal neck pain    diagnosis.  This included previsit chart review, lab review, study review, order entry, electronic health record documentation, patient education on the different diagnostic and therapeutic options, counseling and coordination of care, risks and benefits of management, compliance, or risk factor reduction. This does  not include time spent on botox.    05/27/2021: Stable, still excellent response, > 80% improvement with freg of migraines   Reviewed orally with patient, additionally signature is on file:  Botulism toxin has been approved by the Federal drug administration for treatment of chronic migraine. Botulism toxin does not cure chronic migraine and it may not be effective in some patients.  The administration of botulism toxin is accomplished by injecting a small amount of toxin into the muscles of the neck and head. Dosage must be titrated for each individual. Any benefits resulting from botulism toxin tend to wear off after 3 months with a repeat injection required if benefit is to be maintained. Injections are usually done every 3-4 months with maximum effect peak achieved by about 2 or 3 weeks. Botulism toxin is expensive and you should be sure of what costs you will incur resulting from the injection.  The side effects of botulism toxin use for chronic migraine may include:   -Transient, and usually mild, facial weakness with facial injections  -Transient, and usually mild, head or neck weakness with head/neck injections  -Reduction or loss of forehead facial animation due to forehead muscle weakness  -Eyelid drooping  -Dry eye  -Pain at the site of injection or bruising at the site of injection  -Double vision  -Potential unknown long term risks  Contraindications: You should not have Botox if you are pregnant, nursing, allergic to albumin, have an infection, skin condition, or muscle weakness at the site of the injection, or have myasthenia gravis, Lambert-Eaton syndrome, or ALS.  It is also possible that as with any injection, there may be an allergic reaction  or no effect from the medication. Reduced effectiveness after repeated injections is sometimes seen and rarely infection at the injection site may occur. All care will be taken to prevent these side effects. If therapy is given over  a long time, atrophy and wasting in the muscle injected may occur. Occasionally the patient's become refractory to treatment because they develop antibodies to the toxin. In this event, therapy needs to be modified.  I have read the above information and consent to the administration of botulism toxin.    BOTOX PROCEDURE NOTE FOR MIGRAINE HEADACHE    Contraindications and precautions discussed with patient(above). Aseptic procedure was observed and patient tolerated procedure. Procedure performed by Dr. Artemio Aly  The condition has existed for more than 6 months, and pt does not have a diagnosis of ALS, Myasthenia Gravis or Lambert-Eaton Syndrome.  Risks and benefits of injections discussed and pt agrees to proceed with the procedure.  Written consent obtained  These injections are medically necessary. Pt  receives good benefits from these injections. These injections do not cause sedations or hallucinations which the oral therapies may cause.  Description of procedure:  The patient was placed in a sitting position. The standard protocol was used for Botox as follows, with 5 units of Botox injected at each site:   -Procerus muscle, midline injection  -Corrugator muscle, bilateral injection  -Frontalis muscle, bilateral injection, with 2 sites each side, medial injection was performed in the upper one third of the frontalis muscle, in the region vertical from the medial inferior edge of the superior orbital rim. The lateral injection was again in the upper one third of the forehead vertically above the lateral limbus of the cornea, 1.5 cm lateral to the medial injection site.  - Levator Scapulae: 5 units bilaterally  -Temporalis muscle injection, 5 sites, bilaterally. The first injection was 3 cm above the tragus of the ear, second injection site was 1.5 cm to 3 cm up from the first injection site in line with the tragus of the ear. The third injection site was 1.5-3 cm forward between  the first 2 injection sites. The fourth injection site was 1.5 cm posterior to the second injection site. 5th site laterally in the temporalis  muscleat the level of the outer canthus.  - Patient feels her clenching is a trigger for headaches. +5 units masseter bilaterally   - Patient feels the migraines are centered around the eyes +5 units bilaterally at the outer canthus in the orbicularis occuli  -Occipitalis muscle injection, 3 sites, bilaterally. The first injection was done one half way between the occipital protuberance and the tip of the mastoid process behind the ear. The second injection site was done lateral and superior to the first, 1 fingerbreadth from the first injection. The third injection site was 1 fingerbreadth superiorly and medially from the first injection site.  -Cervical paraspinal muscle injection, 2 sites, bilateral knee first injection site was 1 cm from the midline of the cervical spine, 3 cm inferior to the lower border of the occipital protuberance. The second injection site was 1.5 cm superiorly and laterally to the first injection site.  -Trapezius muscle injection was performed at 3 sites, bilaterally. The first injection site was in the upper trapezius muscle halfway between the inflection point of the neck, and the acromion. The second injection site was one half way between the acromion and the first injection site. The third injection was done between the first injection site and the inflection point of  the neck.   Will return for repeat injection in 3 months.   155 unit sof Botox was used, 45U Botox not injected was wasted. The patient tolerated the procedure well, there were no complications of the above procedure.

## 2021-08-24 ENCOUNTER — Telehealth: Payer: Self-pay | Admitting: Neurology

## 2021-08-24 NOTE — Telephone Encounter (Signed)
Patient had a Botox appointment yesterday. She was seen by Dr. Lucia Gaskins. At check-in, she let us know about a change in insurance. She no longer has [employer], she now has [employer]. I called them @ [phone] and spoke with Colin Mulders to see if PA is required for CPT codes J0585 & [phone]. Colin Mulders states no Berkley Harvey is required as long as we are in-network. Reference #[id number].

## 2021-08-25 NOTE — Progress Notes (Signed)
Michelle Rhodes Phone: [phone] Subjective:   [name], am serving as a scribe for Dr. Hulan Saas. This visit occurred during the SARS-CoV-2 public health emergency.  Safety protocols were in place, including screening questions prior to the visit, additional usage of staff PPE, and extensive cleaning of exam room while observing appropriate contact time as indicated for disinfecting solutions.  I'm seeing this patient by the request  of: Leanor Rubenstein MD   CC: headache and neck pain   [id number]  Michelle Rhodes is a 41 y.o. female coming in with complaint of cervical spine pain. Chronic migraines. Been receiving botox injections Normal C-spine MRI 2018. Patient states that she does not have any radiating symptoms. Feels tightness in her neck before her migraines start. If patient pushes on certain areas of her neck she will get a migraine. Gets massages for relief. Feels that cervical ROM is limited. Patient has tried acupuncture and PT.   MRI brain in 202 shows findings suggestive of migraines .    ?need for labs     Past Medical History:  Diagnosis Date   Anxiety    Asthma    as a child   Migraine    Past Surgical History:  Procedure Laterality Date   BREAST BIOPSY Left 2012   x 2    BREAST REDUCTION SURGERY     ENDOMETRIAL ABLATION N/A 01/06/2015   Procedure: ENDOMETRIAL ABLATION;  Surgeon: Aletha Halim, MD;  Location: [facility];  Service: Gynecology;  Laterality: N/A;   LAPAROSCOPIC BILATERAL SALPINGO OOPHERECTOMY Bilateral 01/06/2015   Procedure: LAPAROSCOPIC BILATERAL TUBAL LIGATION with FILSIE CLIPS ;  Surgeon: Aletha Halim, MD;  Location: [facility] OR[facility];  Service: Gynecology;  Laterality: Bilateral;   Social History   Socioeconomic History   Marital status: Single    Spouse name: Not on file   Number of children: 1   Years of education: 12   Highest education level: Not on file   Occupational History   Not on file  Tobacco Use   Smoking status: Former    Packs/day: 0.25    Years: 10.00    Pack years: 2.50    Types: Cigarettes    Quit date: 11/11/2016    Years since quitting: 4.7   Smokeless tobacco: Never  Vaping Use   Vaping Use: Every day  Substance and Sexual Activity   Alcohol use: No   Drug use: No   Sexual activity: Yes    Birth control/protection: Surgical  Other Topics Concern   Not on file  Social History Narrative   Lives at home with son.   Caffeine use: tea occasionally with dinner   Right handed   Social Determinants of Health   Financial Resource Strain: Not on file  Food Insecurity: Not on file  Transportation Needs: Not on file  Physical Activity: Not on file  Stress: Not on file  Social Connections: Not on file   No Known Allergies Family History  Problem Relation Age of Onset   Hypertension Mother    Stroke Mother    Hypertension Father    Colon cancer Paternal Grandfather    Migraines Neg Hx        Current Outpatient Medications (Analgesics):    rizatriptan (MAXALT-MLT) 10 MG disintegrating tablet, Dissolve 1 tablet (10 mg total) by mouth once as needed for migraine. May repeat in 2 hours if needed   SUMAtriptan (IMITREX) 6 MG/0.5ML SOLN injection,  Inject 0.5 mLs (6 mg total) into the skin every 2 (two) hours as needed for migraine or headache. Take one dose at headache onset, can take additional dose 2hrs later if needed. No more then 2 injections in 24hrs   Ubrogepant (UBRELVY) 100 MG TABS, Take 100 mg by mouth every 2 (two) hours as needed. Maximum 240m a day.   Current Outpatient Medications (Other):    botulinum toxin Type A (BOTOX) 100 units SOLR injection, PROVIDER TO INJECT 155 UNITS INTRAMUSCULARLY INTO HEAD AND NECK EVERY 3 MONTHS. DISCARD REMAINDER.   COVID-19 At Home Antigen Test ([facility]) KIT, Use as directed   cyclobenzaprine (FLEXERIL) 10 MG tablet, Take 1 tablet (10 mg total) by  mouth 3 (three) times daily as needed for muscle spasms.   oseltamivir (TAMIFLU) 75 MG capsule, Take 1 capsule (75 mg total) by mouth 2 (two) times daily for 5 days   oxybutynin (DITROPAN-XL) 10 MG 24 hr tablet, Take 1 tablet (10 mg total) by mouth daily.   Reviewed prior external information including notes and imaging from  primary care provider As well as notes that were available from care everywhere and other healthcare systems.  Past medical history, social, surgical and family history all reviewed in electronic medical record.  No pertanent information unless stated regarding to the chief complaint.   Review of Systems:  No headache, visual changes, nausea, vomiting, diarrhea, constipation, dizziness, abdominal pain, skin rash, fevers, chills, night sweats, weight loss, swollen lymph nodes, body aches, joint swelling, chest pain, shortness of breath, mood changes. POSITIVE muscle aches  Objective  Blood pressure 98/72, pulse 82, height 5' 4" (1.626 m), weight 150 lb (68 kg), SpO2 97 %.   General: No apparent distress alert and oriented x3 mood and affect normal, dressed appropriately.  HEENT: Pupils equal, extraocular movements intact  Respiratory: Patient's speak in full sentences and does not appear short of breath  Cardiovascular: No lower extremity edema, non tender, no erythema  Gait normal with good balance and coordination.  MSK: Patient does have hypermobility noted.  Patient does have some mild limited range of motion in all planes when he comes to the neck.  Patient has negative Spurling's noted.  Patient does have some scapular dyskinesis left greater than right.  Negative Spurling's.  5 out of 5 strength of the upper extremities.  Osteopathic findings C2 flexed rotated and side bent left C4 flexed rotated and side bent left C6 flexed rotated and side bent left T3 extended rotated and side bent left inhaled third rib T9 extended rotated and side bent right   [id number];  15 additional minutes spent for Therapeutic exercises as stated in above notes.  This included exercises focusing on stretching, strengthening, with significant focus on eccentric aspects.   Long term goals include an improvement in range of motion, strength, endurance as well as avoiding reinjury. Patient's frequency would include in 1-2 times a day, 3-5 times a week for a duration of 6-12 weeks. Exercises that included:  Basic scapular stabilization to include adduction and depression of scapula Scaption, focusing on proper movement and good control Internal and External rotation utilizing a theraband, with elbow tucked at side entire time Rows with theraband   Proper technique shown and discussed handout in great detail with ATC.  All questions were discussed and answered.       Impression and Recommendations:     The above documentation has been reviewed and is accurate and complete ZLyndal Pulley DO

## 2021-08-28 ENCOUNTER — Ambulatory Visit (INDEPENDENT_AMBULATORY_CARE_PROVIDER_SITE_OTHER): Payer: Medicaid Other | Admitting: Family Medicine

## 2021-08-28 ENCOUNTER — Other Ambulatory Visit: Payer: Self-pay

## 2021-08-28 VITALS — BP 98/72 | HR 82 | Ht 64.0 in | Wt 150.0 lb

## 2021-08-28 DIAGNOSIS — G4486 Cervicogenic headache: Secondary | ICD-10-CM | POA: Insufficient documentation

## 2021-08-28 DIAGNOSIS — M9902 Segmental and somatic dysfunction of thoracic region: Secondary | ICD-10-CM | POA: Diagnosis not present

## 2021-08-28 DIAGNOSIS — M9908 Segmental and somatic dysfunction of rib cage: Secondary | ICD-10-CM

## 2021-08-28 DIAGNOSIS — M9901 Segmental and somatic dysfunction of cervical region: Secondary | ICD-10-CM

## 2021-08-28 NOTE — Assessment & Plan Note (Signed)
Patient does have significant tightness of the left side of the occipital area.  This could be contributing to some of the headaches pressure.  Patient noted does have abnormality of the white matter in the brain that is consistent with migraines as well.  Discussed with patient about different treatment options and patient elected to try the osteopathic manipulation.  We discussed vitamin supplementations that could be helpful as well.  Discussed increasing activity slowly and ergonomics throughout the day.  Follow-up with me again in 6 to 8 weeks.

## 2021-08-28 NOTE — Assessment & Plan Note (Signed)
   Decision today to treat with OMT was based on Physical Exam  After verbal consent patient was treated with HVLA, ME, FPR techniques in cervical, thoracic, rib,  areas,  Patient tolerated the procedure well with improvement in symptoms  Patient given exercises, stretches and lifestyle modifications  See medications in patient instructions if given  Patient will follow up in 4-8 weeks 

## 2021-08-28 NOTE — Patient Instructions (Signed)
Vit D 2000IU Vit C 1000mg  daily  See me in 5-6 weeks

## 2021-09-21 NOTE — Progress Notes (Signed)
Zach Gianni Mihalik Hassell [address] [place] Phone: [phone] Subjective:   IVilma Meckel, am serving as a scribe for Dr. Hulan Saas. This visit occurred during the SARS-CoV-2 public health emergency.  Safety protocols were in place, including screening questions prior to the visit, additional usage of staff PPE, and extensive cleaning of exam room while observing appropriate contact time as indicated for disinfecting solutions.   I'm seeing this patient by the request  of:  York, Regina F, NP  CC: Back and neck pain follow-up  [id number]  SHERRLYN BUCKLES is a 41 y.o. female coming in with complaint of back and neck pain. OMT 08/28/2021. Patient states same per usual. No new complaints.  Medications patient has been prescribed: none          Reviewed prior external information including notes and imaging from previsou exam, outside providers and external EMR if available.   As well as notes that were available from care everywhere and other healthcare systems.  Past medical history, social, surgical and family history all reviewed in electronic medical record.  No pertanent information unless stated regarding to the chief complaint.   Past Medical History:  Diagnosis Date   Anxiety    Asthma    as a child   Migraine     No Known Allergies   Review of Systems:  No , visual changes, nausea, vomiting, diarrhea, constipation, dizziness, abdominal pain, skin rash, fevers, chills, night sweats, weight loss, swollen lymph nodes, body aches, joint swelling, chest pain, shortness of breath, mood changes. POSITIVE muscle aches, headache  Objective  Blood pressure 98/66, pulse 68, height 5\' 4"  (1.626 m), weight 151 lb (68.5 kg), SpO2 95 %.   General: No apparent distress alert and oriented x3 mood and affect normal, dressed appropriately.  HEENT: Pupils equal, extraocular movements intact  Respiratory: Patient's speak in full  sentences and does not appear short of breath  Cardiovascular: No lower extremity edema, non tender, no erythema  Low back exam does have some loss of lordosis.  There are some tenderness to palpation of the paraspinal musculature. Neck exam does have loss of lordosis.  Tightness in the left trapezius noted. Limited side bending of the neck on the left.  Osteopathic findings  C2 flexed rotated and side bent right C6 flexed rotated and side bent left T3 extended rotated and side bent left inhaled rib T9 extended rotated and side bent right L2 flexed rotated and side bent right Sacrum right on right       Assessment and Plan:   Cervicogenic headache Continues to have the tightness and seems to be left greater than right. We will continue to monitor.  Continues to have the headaches fairly regularly.  Has a muscle relaxers, will continue to work with the manipulation with patient is feeling like it is making some improvement.  Follow-up again in 6 weeks   Nonallopathic problems  Decision today to treat with OMT was based on Physical Exam  After verbal consent patient was treated with HVLA, ME, FPR techniques in cervical, rib, thoracic, lumbar, and sacral  areas  Patient tolerated the procedure well with improvement in symptoms  Patient given exercises, stretches and lifestyle modifications  See medications in patient instructions if given  Patient will follow up in 4-8 weeks      The above documentation has been reviewed and is accurate and complete Lyndal Pulley, DO        Note:  This dictation was prepared with Dragon dictation along with smaller phrase technology. Any transcriptional errors that result from this process are unintentional.

## 2021-09-22 ENCOUNTER — Other Ambulatory Visit: Payer: Self-pay

## 2021-09-22 ENCOUNTER — Ambulatory Visit (INDEPENDENT_AMBULATORY_CARE_PROVIDER_SITE_OTHER): Payer: Medicaid Other | Admitting: Family Medicine

## 2021-09-22 VITALS — BP 98/66 | HR 68 | Ht 64.0 in | Wt 151.0 lb

## 2021-09-22 DIAGNOSIS — M9901 Segmental and somatic dysfunction of cervical region: Secondary | ICD-10-CM

## 2021-09-22 DIAGNOSIS — M9908 Segmental and somatic dysfunction of rib cage: Secondary | ICD-10-CM

## 2021-09-22 DIAGNOSIS — M9902 Segmental and somatic dysfunction of thoracic region: Secondary | ICD-10-CM

## 2021-09-22 DIAGNOSIS — M9904 Segmental and somatic dysfunction of sacral region: Secondary | ICD-10-CM

## 2021-09-22 DIAGNOSIS — G4486 Cervicogenic headache: Secondary | ICD-10-CM | POA: Diagnosis not present

## 2021-09-22 DIAGNOSIS — M9903 Segmental and somatic dysfunction of lumbar region: Secondary | ICD-10-CM | POA: Diagnosis not present

## 2021-09-22 NOTE — Patient Instructions (Signed)
Good to see you! Good luck with puppy Keep doing everything you're doing See you again in 4-6 weeks

## 2021-09-22 NOTE — Assessment & Plan Note (Signed)
Continues to have the tightness and seems to be left greater than right. We will continue to monitor.  Continues to have the headaches fairly regularly.  Has a muscle relaxers, will continue to work with the manipulation with patient is feeling like it is making some improvement.  Follow-up again in 6 weeks

## 2021-10-09 ENCOUNTER — Encounter: Payer: Self-pay | Admitting: Neurology

## 2021-10-09 ENCOUNTER — Other Ambulatory Visit: Payer: Self-pay | Admitting: Neurology

## 2021-10-09 MED ORDER — TOPIRAMATE 50 MG PO TABS: 50.0000 mg | ORAL_TABLET | Freq: Every day | ORAL | 3 refills | Status: DC

## 2021-11-02 NOTE — Progress Notes (Signed)
?  Terrilee Files D.O. ?[facility] ?[address] [place] [address] ?Phone: [phone] ?Subjective:   ?I, Wilford Grist, am serving as a scribe for Dr. Antoine Primas. ? ?This visit occurred during the SARS-CoV-2 public health emergency.  Safety protocols were in place, including screening questions prior to the visit, additional usage of staff PPE, and extensive cleaning of exam room while observing appropriate contact time as indicated for disinfecting solutions.  ? ?I'm seeing this patient by the request  of:  Dema Severin, NP ? ?CC: Back and neck pain ? ?[id number]  ?BEVA REMUND is a 41 y.o. female coming in with complaint of back and neck pain. OMT 09/22/2021. Patient states that she has not had much pain since last visit.  ?Medications patient has been prescribed: None ? ? ?  ? ? ? ? ?Reviewed prior external information including notes and imaging from previsou exam, outside providers and external EMR if available.  ? ?As well as notes that were available from care everywhere and other healthcare systems. ? ?Past medical history, social, surgical and family history all reviewed in electronic medical record.  No pertanent information unless stated regarding to the chief complaint.  ? ?Past Medical History:  ?Diagnosis Date  ? Anxiety   ? Asthma   ? as a child  ? Migraine   ?  ?No Known Allergies ? ? ?Review of Systems: ? No , visual changes, nausea, vomiting, diarrhea, constipation, dizziness, abdominal pain, skin rash, fevers, chills, night sweats, weight loss, swollen lymph nodes, body aches, joint swelling, chest pain, shortness of breath, mood changes. POSITIVE muscle aches, headache ? ?Objective  ?Blood pressure 100/76, pulse 82, height 5\' 4"  (1.626 m), weight 148 lb (67.1 kg), SpO2 98 %. ?  ?General: No apparent distress alert and oriented x3 mood and affect normal, dressed appropriately.  ?HEENT: Pupils equal, extraocular movements intact  ?Respiratory: Patient's speak in full  sentences and does not appear short of breath  ?Cardiovascular: No lower extremity edema, non tender, no erythema  ? ? ?Osteopathic findings ? ?C2 flexed rotated and side bent left  ?C6 flexed rotated and side bent left ?T3 extended rotated and side bent left  inhaled rib ? ? ?  ?Assessment and Plan: ? ?Musculoskeletal neck pain ?Patient does have more tightness still noted in the neck.  Does seem to be secondary to weather as well as stress.  She does not take any significant medications at this time.  I do feel there is a possibility for beneficial effect on gabapentin if we need to at the moment though we will hold.  Discussed posture noted and ergonomics.  Follow-up again in 6 to 8 weeks.  ? ?Nonallopathic problems ? ?Decision today to treat with OMT was based on Physical Exam ? ?After verbal consent patient was treated with HVLA, ME, FPR techniques in cervical, rib, thoracic  areas ? ?Patient tolerated the procedure well with improvement in symptoms ? ?Patient given exercises, stretches and lifestyle modifications ? ?See medications in patient instructions if given ? ?Patient will follow up in 6-8 weeks ? ?  ? ?The above documentation has been reviewed and is accurate and complete , DO ? ? ? ?  ? ? Note: This dictation was prepared with Dragon dictation along with smaller phrase technology. Any transcriptional errors that result from this process are unintentional.    ?  ?  ? ?

## 2021-11-03 ENCOUNTER — Other Ambulatory Visit: Payer: Self-pay

## 2021-11-03 ENCOUNTER — Ambulatory Visit (INDEPENDENT_AMBULATORY_CARE_PROVIDER_SITE_OTHER): Payer: Medicaid Other | Admitting: Family Medicine

## 2021-11-03 VITALS — BP 100/76 | HR 82 | Ht 64.0 in | Wt 148.0 lb

## 2021-11-03 DIAGNOSIS — M542 Cervicalgia: Secondary | ICD-10-CM | POA: Diagnosis not present

## 2021-11-03 DIAGNOSIS — M9908 Segmental and somatic dysfunction of rib cage: Secondary | ICD-10-CM

## 2021-11-03 DIAGNOSIS — M9902 Segmental and somatic dysfunction of thoracic region: Secondary | ICD-10-CM | POA: Diagnosis not present

## 2021-11-03 DIAGNOSIS — M9901 Segmental and somatic dysfunction of cervical region: Secondary | ICD-10-CM

## 2021-11-03 NOTE — Assessment & Plan Note (Signed)
Patient does have more tightness still noted in the neck.  Does seem to be secondary to weather as well as stress.  She does not take any significant medications at this time.  I do feel there is a possibility for beneficial effect on gabapentin if we need to at the moment though we will hold.  Discussed posture noted and ergonomics.  Follow-up again in 6 to 8 weeks. ?

## 2021-11-03 NOTE — Patient Instructions (Signed)
See me again in 6-8 weeks ?

## 2021-11-15 ENCOUNTER — Ambulatory Visit: Payer: Medicaid Other | Admitting: Adult Health

## 2021-11-15 VITALS — BP 118/74 | HR 73

## 2021-11-15 DIAGNOSIS — G43711 Chronic migraine without aura, intractable, with status migrainosus: Secondary | ICD-10-CM

## 2021-11-15 NOTE — Progress Notes (Signed)
Botox- 200 units x 1 vial ?Lot: [id number] ?Expiration: 04/2024 ?[place]: [phone] ?  ?Bacteriostatic 0.9% Sodium Chloride- 86mL total ?[id number] ?Expiration: 12/23 ?[place]: [id number] ?  ?Dx: [id number] ?BB ?

## 2021-11-15 NOTE — Progress Notes (Signed)
? ? ?11/15/21 JM: Patient returns for repeat Botox injection.  Prior Botox injection 1/11 by Dr. Lucia Gaskins, tolerated well. Seen by orthopedic Dr. Katrinka Blazing for neck and back pain with OMT 2/10 with improvement of symptoms and has follow-up visit scheduled next month.  She was started on topiramate 2/27 as a preventative due to allergy season and warm weather which can trigger migraines, currently taking 50mg  nightly. Does endorse recent increase in headaches but endorses increased stress with recent passing of her mother.  Tolerated procedure well today. ? ? ? ? ?Consent Form ?Botulism Toxin Injection For Chronic Migraine ? ? ? ?Reviewed orally with patient, additionally signature is on file: ? ?Botulism toxin has been approved by the Federal drug administration for treatment of chronic migraine. Botulism toxin does not cure chronic migraine and it may not be effective in some patients. ? ?The administration of botulism toxin is accomplished by injecting a small amount of toxin into the muscles of the neck and head. Dosage must be titrated for each individual. Any benefits resulting from botulism toxin tend to wear off after 3 months with a repeat injection required if benefit is to be maintained. Injections are usually done every 3-4 months with maximum effect peak achieved by about 2 or 3 weeks. Botulism toxin is expensive and you should be sure of what costs you will incur resulting from the injection. ? ?The side effects of botulism toxin use for chronic migraine may include: ? ? -Transient, and usually mild, facial weakness with facial injections ? -Transient, and usually mild, head or neck weakness with head/neck injections ? -Reduction or loss of forehead facial animation due to forehead muscle weakness ? -Eyelid drooping ? -Dry eye ? -Pain at the site of injection or bruising at the site of injection ? -Double vision ? -Potential unknown long term risks ? ? ?Contraindications: You should not have Botox if you are  pregnant, nursing, allergic to albumin, have an infection, skin condition, or muscle weakness at the site of the injection, or have myasthenia gravis, Lambert-Eaton syndrome, or ALS. ? ?It is also possible that as with any injection, there may be an allergic reaction or no effect from the medication. Reduced effectiveness after repeated injections is sometimes seen and rarely infection at the injection site may occur. All care will be taken to prevent these side effects. If therapy is given over a long time, atrophy and wasting in the muscle injected may occur. Occasionally the patient's become refractory to treatment because they develop antibodies to the toxin. In this event, therapy needs to be modified. ? ?I have read the above information and consent to the administration of botulism toxin. ? ? ? ?BOTOX PROCEDURE NOTE FOR MIGRAINE HEADACHE ? ?Contraindications and precautions discussed with patient(above). Aseptic procedure was observed and patient tolerated procedure. Procedure performed by ? ?The condition has existed for more than 6 months, and pt does not have a diagnosis of ALS, Myasthenia Gravis or Lambert-Eaton Syndrome.  Risks and benefits of injections discussed and pt agrees to proceed with the procedure.  Written consent obtained ? ?These injections are medically necessary. Pt  receives good benefits from these injections. These injections do not cause sedations or hallucinations which the oral therapies may cause. ? ? ? ?Botox- 200 units x 1 vial ?Lot: Shanda [id number] ?Expiration: 04/2024 ?[facility]: [phone] ?  ?Bacteriostatic 0.9% Sodium Chloride- 31mL total ?11m ?Expiration: 12/23 ?[facility]: 1/24 ?  ?Dx: [phone] ?Description of procedure: ? ?The patient was placed in a sitting position.  The standard protocol was used for Botox as follows, with 5 units of Botox injected at each site: ? ?-Procerus muscle, midline injection ? ?-Corrugator muscle, bilateral injection ? ?-Frontalis muscle,  bilateral injection, with 2 sites each side, medial injection was performed in the upper one third of the frontalis muscle, in the region vertical from the medial inferior edge of the superior orbital rim. The lateral injection was again in the upper one third of the forehead vertically above the lateral limbus of the cornea, 1.5 cm lateral to the medial injection site. ? ?-Temporalis muscle injection, 4 sites, bilaterally. The first injection was 3 cm above the tragus of the ear, second injection site was 1.5 cm to 3 cm up from the first injection site in line with the tragus of the ear. The third injection site was 1.5-3 cm forward between the first 2 injection sites. The fourth injection site was 1.5 cm posterior to the second injection site. 5th site laterally in the temporalis  muscleat the level of the outer canthus. ? ?-Occipitalis muscle injection, 3 sites, bilaterally. The first injection was done one half way between the occipital protuberance and the tip of the mastoid process behind the ear. The second injection site was done lateral and superior to the first, 1 fingerbreadth from the first injection. The third injection site was 1 fingerbreadth superiorly and medially from the first injection site. ? ?-Cervical paraspinal muscle injection, 2 sites, bilaterally. The first injection site was 1 cm from the midline of the cervical spine, 3 cm inferior to the lower border of the occipital protuberance. The second injection site was 1.5 cm superiorly and laterally to the first injection site. ? ?-Trapezius muscle injection was performed at 3 sites, bilaterally. The first injection site was in the upper trapezius muscle halfway between the inflection point of the neck, and the acromion. The second injection site was one half way between the acromion and the first injection site. The third injection was done between the first injection site and the inflection point of the neck. ? ? ?Will return for repeat  injection in 3 months. ? ? ?A total of 200 units of Botox was prepared, 155 units of Botox was injected as documented above, any Botox not injected was wasted. The patient tolerated the procedure well, there were no complications of the above procedure. ? ? ?

## 2021-12-16 ENCOUNTER — Other Ambulatory Visit: Payer: Self-pay | Admitting: Neurology

## 2021-12-19 ENCOUNTER — Other Ambulatory Visit (HOSPITAL_COMMUNITY): Payer: Self-pay

## 2021-12-19 MED ORDER — CYCLOBENZAPRINE HCL 10 MG PO TABS
10.0000 mg | ORAL_TABLET | Freq: Three times a day (TID) | ORAL | 0 refills | Status: DC | PRN
  Filled 2021-12-19: qty 90, 30d supply, fill #0

## 2021-12-19 MED ORDER — RIZATRIPTAN BENZOATE 10 MG PO TBDP
10.0000 mg | ORAL_TABLET | Freq: Once | ORAL | 0 refills | Status: DC | PRN
Start: 2021-12-19 — End: 2022-05-23
  Filled 2021-12-19: qty 18, 30d supply, fill #0

## 2021-12-20 ENCOUNTER — Other Ambulatory Visit (HOSPITAL_COMMUNITY): Payer: Self-pay

## 2021-12-27 NOTE — Progress Notes (Deleted)
  [facility] [place] [place] Phone: [phone] Subjective:    I'm seeing this patient by the request  of:  York, Regina F, NP  CC:   [id number]  Michelle Rhodes is a 41 y.o. female coming in with complaint of back and neck pain. OMT 11/03/2021. Patient states   Medications patient has been prescribed: None  Taking:         Reviewed prior external information including notes and imaging from previsou exam, outside providers and external EMR if available.   As well as notes that were available from care everywhere and other healthcare systems.  Past medical history, social, surgical and family history all reviewed in electronic medical record.  No pertanent information unless stated regarding to the chief complaint.   Past Medical History:  Diagnosis Date   Anxiety    Asthma    as a child   Migraine     No Known Allergies   Review of Systems:  No headache, visual changes, nausea, vomiting, diarrhea, constipation, dizziness, abdominal pain, skin rash, fevers, chills, night sweats, weight loss, swollen lymph nodes, body aches, joint swelling, chest pain, shortness of breath, mood changes. POSITIVE muscle aches  Objective  There were no vitals taken for this visit.   General: No apparent distress alert and oriented x3 mood and affect normal, dressed appropriately.  HEENT: Pupils equal, extraocular movements intact  Respiratory: Patient's speak in full sentences and does not appear short of breath  Cardiovascular: No lower extremity edema, non tender, no erythema  Neuro: Cranial nerves II through XII are intact, neurovascularly intact in all extremities with 2+ DTRs and 2+ pulses.  Gait normal with good balance and coordination.  MSK:  Non tender with full range of motion and good stability and symmetric strength and tone of shoulders, elbows, wrist, hip, knee and ankles bilaterally.  Back - Normal skin, Spine  with normal alignment and no deformity.  No tenderness to vertebral process palpation.  Paraspinous muscles are not tender and without spasm.   Range of motion is full at neck and lumbar sacral regions  Osteopathic findings  C2 flexed rotated and side bent right C6 flexed rotated and side bent left T3 extended rotated and side bent right inhaled rib T9 extended rotated and side bent left L2 flexed rotated and side bent right Sacrum right on right       Assessment and Plan:    Nonallopathic problems  Decision today to treat with OMT was based on Physical Exam  After verbal consent patient was treated with HVLA, ME, FPR techniques in cervical, rib, thoracic, lumbar, and sacral  areas  Patient tolerated the procedure well with improvement in symptoms  Patient given exercises, stretches and lifestyle modifications  See medications in patient instructions if given  Patient will follow up in 4-8 weeks      The above documentation has been reviewed and is accurate and complete Jacqualin Combes       Note: This dictation was prepared with Dragon dictation along with smaller phrase technology. Any transcriptional errors that result from this process are unintentional.

## 2021-12-29 ENCOUNTER — Ambulatory Visit: Payer: Medicaid Other | Admitting: Family Medicine

## 2021-12-29 ENCOUNTER — Other Ambulatory Visit (HOSPITAL_COMMUNITY): Payer: Self-pay

## 2022-01-01 ENCOUNTER — Encounter: Payer: Self-pay | Admitting: *Deleted

## 2022-01-25 ENCOUNTER — Other Ambulatory Visit: Payer: Self-pay | Admitting: Neurology

## 2022-02-08 ENCOUNTER — Telehealth: Payer: Self-pay | Admitting: Family Medicine

## 2022-02-08 NOTE — Telephone Encounter (Signed)
Pt calling to discuss Botox appt. Would like a call back.

## 2022-02-12 ENCOUNTER — Ambulatory Visit: Payer: Medicaid Other | Admitting: Adult Health

## 2022-02-20 ENCOUNTER — Encounter: Payer: Self-pay | Admitting: Neurology

## 2022-02-20 MED ORDER — TOPAMAX 50 MG PO TABS: 50.0000 mg | ORAL_TABLET | Freq: Every day | ORAL | 2 refills | Status: DC

## 2022-02-21 NOTE — Telephone Encounter (Signed)
I called patient lm to get 07/14 appt rescheduled made a scheduling error.

## 2022-02-23 ENCOUNTER — Ambulatory Visit: Payer: Medicaid Other | Admitting: Neurology

## 2022-02-25 ENCOUNTER — Encounter: Payer: Self-pay | Admitting: Neurology

## 2022-02-27 NOTE — Telephone Encounter (Signed)
I called patient, Michelle Rhodes about two appt dates tomorrow and Thursday at 4:30 with Dr. Lucia Gaskins.

## 2022-02-27 NOTE — Progress Notes (Unsigned)
[name] [address] [place] [address] Phone: [phone] Subjective:   Michelle Rhodes, am serving as a scribe for Dr. Antoine Primas.  I'm seeing this patient by the request  of:  York, Regina F, NP  CC: Back and neck pain follow-up  [id number]  Michelle Rhodes is a 41 y.o. female coming in with complaint of back and neck pain. OMT on 11/03/2021. Patient states that she continues to have migraines due to heat and stress. Lost her mother in March. Patient felt OMT was helping.   Medications patient has been prescribed: Flexeril  Taking:         Reviewed prior external information including notes and imaging from previsou exam, outside providers and external EMR if available.   As well as notes that were available from care everywhere and other healthcare systems.  Past medical history, social, surgical and family history all reviewed in electronic medical record.  No pertanent information unless stated regarding to the chief complaint.   Past Medical History:  Diagnosis Date   Anxiety    Asthma    as a child   Migraine     No Known Allergies   Review of Systems:  No headache, visual changes, nausea, vomiting, diarrhea, constipation, dizziness, abdominal pain, skin rash, fevers, chills, night sweats, weight loss, swollen lymph nodes, body aches, joint swelling, chest pain, shortness of breath, mood changes. POSITIVE muscle aches  Objective  Blood pressure 112/78, pulse 76, height 5\' 4"  (1.626 m), weight 156 lb (70.8 kg), SpO2 98 %.   General: No apparent distress alert and oriented x3 mood and affect normal, dressed appropriately.  HEENT: Pupils equal, extraocular movements intact  Respiratory: Patient's speak in full sentences and does not appear short of breath  Cardiovascular: No lower extremity edema, non tender, no erythema  Gait MSK:  Back does have some loss of lordosis.  Some tenderness to palpation more in the  parascapular region and seems to be left greater than right.  Negative Spurling's.  5 strength of the upper extremities.  Osteopathic findings  C2 flexed rotated and side bent right C6 flexed rotated and side bent left T4 extended rotated and side bent left inhaled rib T9 extended rotated and side bent left L2 flexed rotated and side bent right Sacrum right on right       Assessment and Plan:  Cervicogenic headache Worsening cervicogenic headaches again.  Patient is having more difficulty over the course of time but patient did have the loss of her mother but seems to cause an exacerbation.  Patient given home exercises again.  We discussed icing regimen and home exercises and posture and ergonomics at work if possible.  Follow-up with me again in 6 to 8 weeks for further evaluation and treatment.    Nonallopathic problems  Decision today to treat with OMT was based on Physical Exam  After verbal consent patient was treated with HVLA, ME, FPR techniques in cervical, rib, thoracic, lumbar, and sacral  areas  Patient tolerated the procedure well with improvement in symptoms  Patient given exercises, stretches and lifestyle modifications  See medications in patient instructions if given  Patient will follow up in 4-8 weeks     The above documentation has been reviewed and is accurate and complete , DO         Note: This dictation was prepared with Dragon dictation along with smaller phrase technology. Any transcriptional errors that result from this  process are unintentional.

## 2022-02-28 ENCOUNTER — Encounter: Payer: Self-pay | Admitting: Family Medicine

## 2022-02-28 ENCOUNTER — Ambulatory Visit (INDEPENDENT_AMBULATORY_CARE_PROVIDER_SITE_OTHER): Payer: Medicaid Other | Admitting: Neurology

## 2022-02-28 ENCOUNTER — Ambulatory Visit (INDEPENDENT_AMBULATORY_CARE_PROVIDER_SITE_OTHER): Payer: Medicaid Other | Admitting: Family Medicine

## 2022-02-28 VITALS — BP 112/78 | HR 76 | Ht 64.0 in | Wt 156.0 lb

## 2022-02-28 DIAGNOSIS — M9904 Segmental and somatic dysfunction of sacral region: Secondary | ICD-10-CM | POA: Diagnosis not present

## 2022-02-28 DIAGNOSIS — M9903 Segmental and somatic dysfunction of lumbar region: Secondary | ICD-10-CM

## 2022-02-28 DIAGNOSIS — M9901 Segmental and somatic dysfunction of cervical region: Secondary | ICD-10-CM | POA: Diagnosis not present

## 2022-02-28 DIAGNOSIS — G4486 Cervicogenic headache: Secondary | ICD-10-CM

## 2022-02-28 DIAGNOSIS — M9908 Segmental and somatic dysfunction of rib cage: Secondary | ICD-10-CM

## 2022-02-28 DIAGNOSIS — G43711 Chronic migraine without aura, intractable, with status migrainosus: Secondary | ICD-10-CM | POA: Diagnosis not present

## 2022-02-28 DIAGNOSIS — M9902 Segmental and somatic dysfunction of thoracic region: Secondary | ICD-10-CM

## 2022-02-28 MED ORDER — ONABOTULINUMTOXINA 200 UNITS IJ SOLR: 155.0000 [IU] | Freq: Once | INTRAMUSCULAR | Status: AC

## 2022-02-28 NOTE — Assessment & Plan Note (Signed)
Worsening cervicogenic headaches again.  Patient is having more difficulty over the course of time but patient did have the loss of her mother but seems to cause an exacerbation.  Patient given home exercises again.  We discussed icing regimen and home exercises and posture and ergonomics at work if possible.  Follow-up with me again in 6 to 8 weeks for further evaluation and treatment.

## 2022-02-28 NOTE — Progress Notes (Signed)
Consent Form Botulism Toxin Injection For Chronic Migraine 719/2023: going great, stable, >> 50% decrease in freq and severity of migraines and headaches.   4/5/2023Shanda Bumps [place] 08/23/2021: doing great as far migraines go but she continues to have cervical myofascial pain, musculoskletal neck pain, has tried dry needling, PT, muscle relaxers. Refer to specialist.. MRI cervical spine 2018 was normal. Personally reviewed images with patient (additional 10 minutes on personal review on CD in our office if no improvement recommend repeat MRI cervical spine worsening pain, decreased ROM, crepitus) MRI c-spine 2018: :  Metal artifact slightly obscures the upper anterior spine. On sagittal images, the spine is imaged from above the cervicomedullary junction to T2.   The spinal cord is of normal caliber and signal.   There is some straightening of the cervical curvature. No spondylolisthesis is noted..   The vertebral bodies have normal signal.  The discs and interspaces were further evaluated on axial views from C2 to T1.   The discs were normal at each of the cervical levels. There was no spondylosis. The neural foramina are widely patent at every level and there is no nerve root compression.After the infusion of contrast, a normal enhancement pattern was observed IMPRESSION:  This is a normal MRI of the cervical spine with and without contrast.  No orders of the defined types were placed in this encounter.  I spent 20 minutes of face-to-face and non-face-to-face time with patient on the  1. Chronic migraine without aura, with intractable migraine, so stated, with status migrainosus    diagnosis.  This included previsit chart review, lab review, study review, order entry, electronic health record documentation, patient education on the different diagnostic and therapeutic options, counseling and coordination of care, risks and benefits of management, compliance, or risk factor reduction. This does not  include time spent on botox.    05/27/2021: Stable, still excellent response, > 80% improvement with freg of migraines   Reviewed orally with patient, additionally signature is on file:  Botulism toxin has been approved by the Federal drug administration for treatment of chronic migraine. Botulism toxin does not cure chronic migraine and it may not be effective in some patients.  The administration of botulism toxin is accomplished by injecting a small amount of toxin into the muscles of the neck and head. Dosage must be titrated for each individual. Any benefits resulting from botulism toxin tend to wear off after 3 months with a repeat injection required if benefit is to be maintained. Injections are usually done every 3-4 months with maximum effect peak achieved by about 2 or 3 weeks. Botulism toxin is expensive and you should be sure of what costs you will incur resulting from the injection.  The side effects of botulism toxin use for chronic migraine may include:   -Transient, and usually mild, facial weakness with facial injections  -Transient, and usually mild, head or neck weakness with head/neck injections  -Reduction or loss of forehead facial animation due to forehead muscle weakness  -Eyelid drooping  -Dry eye  -Pain at the site of injection or bruising at the site of injection  -Double vision  -Potential unknown long term risks  Contraindications: You should not have Botox if you are pregnant, nursing, allergic to albumin, have an infection, skin condition, or muscle weakness at the site of the injection, or have myasthenia gravis, Lambert-Eaton syndrome, or ALS.  It is also possible that as with any injection, there may be an allergic reaction or no effect  from the medication. Reduced effectiveness after repeated injections is sometimes seen and rarely infection at the injection site may occur. All care will be taken to prevent these side effects. If therapy is given over a  long time, atrophy and wasting in the muscle injected may occur. Occasionally the patient's become refractory to treatment because they develop antibodies to the toxin. In this event, therapy needs to be modified.  I have read the above information and consent to the administration of botulism toxin.    BOTOX PROCEDURE NOTE FOR MIGRAINE HEADACHE    Contraindications and precautions discussed with patient(above). Aseptic procedure was observed and patient tolerated procedure. Procedure performed by Dr. Artemio Aly  The condition has existed for more than 6 months, and pt does not have a diagnosis of ALS, Myasthenia Gravis or Lambert-Eaton Syndrome.  Risks and benefits of injections discussed and pt agrees to proceed with the procedure.  Written consent obtained  These injections are medically necessary. Pt  receives good benefits from these injections. These injections do not cause sedations or hallucinations which the oral therapies may cause.  Description of procedure:  The patient was placed in a sitting position. The standard protocol was used for Botox as follows, with 5 units of Botox injected at each site:   -Procerus muscle, midline injection  -Corrugator muscle, bilateral injection  -Frontalis muscle, bilateral injection, with 2 sites each side, medial injection was performed in the upper one third of the frontalis muscle, in the region vertical from the medial inferior edge of the superior orbital rim. The lateral injection was again in the upper one third of the forehead vertically above the lateral limbus of the cornea, 1.5 cm lateral to the medial injection site.  - Levator Scapulae: 5 units bilaterally  -Temporalis muscle injection, 5 sites, bilaterally. The first injection was 3 cm above the tragus of the ear, second injection site was 1.5 cm to 3 cm up from the first injection site in line with the tragus of the ear. The third injection site was 1.5-3 cm forward between the  first 2 injection sites. The fourth injection site was 1.5 cm posterior to the second injection site. 5th site laterally in the temporalis  muscleat the level of the outer canthus.  - Patient feels her clenching is a trigger for headaches. +5 units masseter bilaterally   - Patient feels the migraines are centered around the eyes +5 units bilaterally at the outer canthus in the orbicularis occuli  -Occipitalis muscle injection, 3 sites, bilaterally. The first injection was done one half way between the occipital protuberance and the tip of the mastoid process behind the ear. The second injection site was done lateral and superior to the first, 1 fingerbreadth from the first injection. The third injection site was 1 fingerbreadth superiorly and medially from the first injection site.  -Cervical paraspinal muscle injection, 2 sites, bilateral knee first injection site was 1 cm from the midline of the cervical spine, 3 cm inferior to the lower border of the occipital protuberance. The second injection site was 1.5 cm superiorly and laterally to the first injection site.  -Trapezius muscle injection was performed at 3 sites, bilaterally. The first injection site was in the upper trapezius muscle halfway between the inflection point of the neck, and the acromion. The second injection site was one half way between the acromion and the first injection site. The third injection was done between the first injection site and the inflection point of the neck.  Will return for repeat injection in 3 months.   155 unit sof Botox was used, 45U Botox not injected was wasted. The patient tolerated the procedure well, there were no complications of the above procedure.

## 2022-02-28 NOTE — Progress Notes (Signed)
Botox- 200 units x 1 vial Lot: [id number] Expiration: 09/2024 [facility]: 3007-6226-33  Bacteriostatic 0.9% Sodium Chloride- 7mL total Lot: [id number] Expiration: 03/14/2023 NDC: 5638-9373-42  Dx: A76.811 B/B

## 2022-02-28 NOTE — Patient Instructions (Signed)
Good to see you Scapular exercises See me in 4 weeks

## 2022-04-05 NOTE — Progress Notes (Signed)
[name] [address] [place] [address] Phone: [phone] Subjective:   Michelle Rhodes, am serving as a scribe for Dr. Antoine Primas.  I'm seeing this patient by the request  of:  Erskine Emery, NP  CC: neck and back pain   [id number]:[id number]  Michelle Rhodes is a 41 y.o. female coming in with complaint of back and neck pain. OMT on 02/28/2022. Patient states worse since the last visit.  Patient has had difficulty where she is having more pain and headaches recently.  Patient states that nothing seems to be making significant differences.  Does have to take the Flexeril on a more regular basis at this time.  No radiation down the arms.  Medications patient has been prescribed: None  Taking:         Reviewed prior external information including notes and imaging from previsou exam, outside providers and external EMR if available.   As well as notes that were available from care everywhere and other healthcare systems.  Past medical history, social, surgical and family history all reviewed in electronic medical record.  No pertanent information unless stated regarding to the chief complaint.   Past Medical History:  Diagnosis Date   Anxiety    Asthma    as a child   Migraine     No Known Allergies   Review of Systems:  No headache, visual changes, nausea, vomiting, diarrhea, constipation, dizziness, abdominal pain, skin rash, fevers, chills, night sweats, weight loss, swollen lymph nodes, body aches, joint swelling, chest pain, shortness of breath, mood changes. POSITIVE muscle aches  Objective  Blood pressure 102/68, pulse 78, height 5\' 4"  (1.626 m), weight 153 lb (69.4 kg), SpO2 98 %.   General: No apparent distress alert and oriented x3 mood and affect normal, dressed appropriately.  HEENT: Pupils equal, extraocular movements intact  Respiratory: Patient's speak in full sentences and does not appear short of breath   Cardiovascular: No lower extremity edema, non tender, no erythema  MSK:  \Neck exam significant tightness noted in bilateral musculature.  Multiple trigger points noted in the trapezius, rhomboid and levator scapula and even latissimus dorsi bilaterally.  Negative Spurling's though.  Limited range of motion secondary to patient voluntary guarding as well.   Osteopathic findings  C2 flexed rotated and side bent right C6 flexed rotated and side bent left T3 extended rotated and side bent right inhaled rib T9 extended rotated and side bent left L2 flexed rotated and side bent right Sacrum right on right   After verbal consent patient was prepped with alcohol swabs and with a 25-gauge needle patient was injected in 4 distinct trigger points in the latissimus dorsi, rhomboid and trapezius muscle.  A total of 3 cc of 0.5% Marcaine and 1 cc of Kenalog 40 mg per mill use.  Minimal blood loss.  Band-Aid placed.  Postinjection instructions given    Assessment and Plan:  Musculoskeletal neck pain Continues to have good difficulty with musculoskeletal problems in the neck.  We will get a repeat x-ray of the neck today as well secondary to the severe worsening pain.  Patient did have trigger points noted today and given trigger point injections.  Patient is to continue to work on other exercises.  Given medications including Flexeril and to use it nightly.  We will get laboratory work-up to make sure nothing else is contributing to some of the headaches as well.  Follow-up again in 4 weeks  Trigger  point of shoulder region Bilateral injections given in the rhomboid, trapezius and latissimus dorsi.  Patient tolerated the procedure well.  Had some significant decrease in pain almost immediately in the neck.  Did not quite help the headache yet though.  Follow-up again still in 4 weeks.  Worsening headache to seek medical attention.    Nonallopathic problems  Decision today to treat with OMT was based  on Physical Exam  After verbal consent patient was treated with HVLA, ME, FPR techniques in cervical, rib, thoracic, lumbar, and sacral  areas  Patient tolerated the procedure well with improvement in symptoms  Patient given exercises, stretches and lifestyle modifications  See medications in patient instructions if given  Patient will follow up in 4-8 weeks     The above documentation has been reviewed and is accurate and complete Judi Saa, DO         Note: This dictation was prepared with Dragon dictation along with smaller phrase technology. Any transcriptional errors that result from this process are unintentional.

## 2022-04-06 ENCOUNTER — Ambulatory Visit (INDEPENDENT_AMBULATORY_CARE_PROVIDER_SITE_OTHER): Payer: Medicaid Other

## 2022-04-06 ENCOUNTER — Ambulatory Visit (INDEPENDENT_AMBULATORY_CARE_PROVIDER_SITE_OTHER): Payer: Medicaid Other | Admitting: Family Medicine

## 2022-04-06 VITALS — BP 102/68 | HR 78 | Ht 64.0 in | Wt 153.0 lb

## 2022-04-06 DIAGNOSIS — M9901 Segmental and somatic dysfunction of cervical region: Secondary | ICD-10-CM | POA: Diagnosis not present

## 2022-04-06 DIAGNOSIS — G4486 Cervicogenic headache: Secondary | ICD-10-CM | POA: Diagnosis not present

## 2022-04-06 DIAGNOSIS — M9908 Segmental and somatic dysfunction of rib cage: Secondary | ICD-10-CM | POA: Diagnosis not present

## 2022-04-06 DIAGNOSIS — M542 Cervicalgia: Secondary | ICD-10-CM | POA: Diagnosis not present

## 2022-04-06 DIAGNOSIS — M9903 Segmental and somatic dysfunction of lumbar region: Secondary | ICD-10-CM | POA: Diagnosis not present

## 2022-04-06 DIAGNOSIS — M25519 Pain in unspecified shoulder: Secondary | ICD-10-CM | POA: Diagnosis not present

## 2022-04-06 DIAGNOSIS — M9904 Segmental and somatic dysfunction of sacral region: Secondary | ICD-10-CM

## 2022-04-06 DIAGNOSIS — M2550 Pain in unspecified joint: Secondary | ICD-10-CM

## 2022-04-06 DIAGNOSIS — M9902 Segmental and somatic dysfunction of thoracic region: Secondary | ICD-10-CM

## 2022-04-06 LAB — COMPREHENSIVE METABOLIC PANEL
ALT: 13 U/L (ref 0–35)
AST: 17 U/L (ref 0–37)
Albumin: 4.8 g/dL (ref 3.5–5.2)
Alkaline Phosphatase: 36 U/L — ABNORMAL LOW (ref 39–117)
BUN: 13 mg/dL (ref 6–23)
CO2: 23 mEq/L (ref 19–32)
Calcium: 9.7 mg/dL (ref 8.4–10.5)
Chloride: 108 mEq/L (ref 96–112)
Creatinine, Ser: 0.89 mg/dL (ref 0.40–1.20)
GFR: 81.49 mL/min (ref >60.00)
Glucose, Bld: 88 mg/dL (ref 70–99)
Potassium: 4 mEq/L (ref 3.5–5.1)
Sodium: 136 mEq/L (ref 135–145)
Total Bilirubin: 0.3 mg/dL (ref 0.2–1.2)
Total Protein: 7.5 g/dL (ref 6.0–8.3)

## 2022-04-06 LAB — CBC WITH DIFFERENTIAL/PLATELET
Basophils Absolute: 0 10*3/uL (ref 0.0–0.1)
Basophils Relative: 0.9 % (ref 0.0–3.0)
Eosinophils Absolute: 0.1 10*3/uL (ref 0.0–0.7)
Eosinophils Relative: 1.6 % (ref 0.0–5.0)
HCT: 40.2 % (ref 36.0–46.0)
Hemoglobin: 13.6 g/dL (ref 12.0–15.0)
Lymphocytes Relative: 27.8 % (ref 12.0–46.0)
Lymphs Abs: 1.4 10*3/uL (ref 0.7–4.0)
MCHC: 33.9 g/dL (ref 30.0–36.0)
MCV: 95.1 fl (ref 78.0–100.0)
Monocytes Absolute: 0.4 10*3/uL (ref 0.1–1.0)
Monocytes Relative: 8.5 % (ref 3.0–12.0)
Neutro Abs: 3.2 10*3/uL (ref 1.4–7.7)
Neutrophils Relative %: 61.2 % (ref 43.0–77.0)
Platelets: 334 10*3/uL (ref 150.0–400.0)
RBC: 4.23 Mil/uL (ref 3.87–5.11)
RDW: 13.2 % (ref 11.5–15.5)
WBC: 5.2 10*3/uL (ref 4.0–10.5)

## 2022-04-06 LAB — IBC PANEL
Iron: 95 ug/dL (ref 42–145)
Saturation Ratios: 26.8 % (ref 20.0–50.0)
TIBC: 354.2 ug/dL (ref 250.0–450.0)
Transferrin: 253 mg/dL (ref 212.0–360.0)

## 2022-04-06 LAB — SEDIMENTATION RATE: Sed Rate: 5 mm/hr (ref 0–20)

## 2022-04-06 LAB — TSH: TSH: 1.83 u[IU]/mL (ref 0.35–5.50)

## 2022-04-06 LAB — VITAMIN D 25 HYDROXY (VIT D DEFICIENCY, FRACTURES): VITD: 33.87 ng/mL (ref 30.00–100.00)

## 2022-04-06 LAB — URIC ACID: Uric Acid, Serum: 2.2 mg/dL — ABNORMAL LOW (ref 2.4–7.0)

## 2022-04-06 LAB — C-REACTIVE PROTEIN: CRP: <1 mg/dL (ref 0.5–20.0)

## 2022-04-06 LAB — FERRITIN: Ferritin: 34.5 ng/mL (ref 10.0–291.0)

## 2022-04-06 MED ORDER — PREDNISONE 20 MG PO TABS: 40.0000 mg | ORAL_TABLET | Freq: Every day | ORAL | 0 refills | Status: DC

## 2022-04-06 NOTE — Assessment & Plan Note (Signed)
Bilateral injections given in the rhomboid, trapezius and latissimus dorsi.  Patient tolerated the procedure well.  Had some significant decrease in pain almost immediately in the neck.  Did not quite help the headache yet though.  Follow-up again still in 4 weeks.  Worsening headache to seek medical attention.

## 2022-04-06 NOTE — Assessment & Plan Note (Signed)
Continues to have good difficulty with musculoskeletal problems in the neck.  We will get a repeat x-ray of the neck today as well secondary to the severe worsening pain.  Patient did have trigger points noted today and given trigger point injections.  Patient is to continue to work on other exercises.  Given medications including Flexeril and to use it nightly.  We will get laboratory work-up to make sure nothing else is contributing to some of the headaches as well.  Follow-up again in 4 weeks

## 2022-04-06 NOTE — Patient Instructions (Addendum)
Trigger Point injections today Prednisone 40mg  prescribed Take flexeril regularly at night Xray today Labs today

## 2022-04-09 LAB — ANA: Anti Nuclear Antibody (ANA): POSITIVE — AB

## 2022-04-09 LAB — PTH, INTACT AND CALCIUM
Calcium: 9.9 mg/dL (ref 8.6–10.2)
PTH: 32 pg/mL (ref 16–77)

## 2022-04-09 LAB — CYCLIC CITRUL PEPTIDE ANTIBODY, IGG: Cyclic Citrullin Peptide Ab: <16 UNITS

## 2022-04-09 LAB — RHEUMATOID FACTOR: Rheumatoid fact SerPl-aCnc: <14 IU/mL (ref <14)

## 2022-04-09 LAB — ANGIOTENSIN CONVERTING ENZYME: Angiotensin-Converting Enzyme: 10 U/L (ref 9–67)

## 2022-04-09 LAB — ANTI-NUCLEAR AB-TITER (ANA TITER): ANA Titer 1: 1:40 {titer} — ABNORMAL HIGH

## 2022-04-09 LAB — CALCIUM, IONIZED: Calcium, Ion: 5.2 mg/dL (ref 4.7–5.5)

## 2022-04-24 NOTE — Progress Notes (Signed)
  [name] [address] [place] [address] Phone: [phone] Subjective:   Michelle Rhodes, am serving as a scribe for Dr. Antoine Primas.  I'm seeing this patient by the request  of:  Erskine Emery, NP  CC: Neck to upper back tenderness  [id number]  [name] is a 41 y.o. female coming in with complaint of back and neck pain. OMT 04/06/2022. Patient states that injections last visit were helpful. Neck pain worse on L but R is also still tight but less than last visit.   Medications patient has been prescribed: Prednisone  Taking:         Reviewed prior external information including notes and imaging from previsou exam, outside providers and external EMR if available.   As well as notes that were available from care everywhere and other healthcare systems.  Past medical history, social, surgical and family history all reviewed in electronic medical record.  No pertanent information unless stated regarding to the chief complaint.   Past Medical History:  Diagnosis Date   Anxiety    Asthma    as a child   Migraine       Review of Systems:  No, visual changes, nausea, vomiting, diarrhea, constipation, dizziness, abdominal pain, skin rash, fevers, chills, night sweats, weight loss, swollen lymph nodes,  joint swelling, chest pain, shortness of breath, mood changes. POSITIVE muscle aches, body aches, headache  Objective  Blood pressure 94/70, pulse 67, height 5\' 4"  (1.626 m), weight 154 lb (69.9 kg), SpO2 99 %.   General: No apparent distress alert and oriented x3 mood and affect normal, dressed appropriately.  HEENT: Pupils equal, extraocular movements intact  Respiratory: Patient's speak in full sentences and does not appear short of breath  Cardiovascular: No lower extremity edema, non tender, no erythema  Gait normal overall MSK:  Neck exam does have some loss of lordosis patient still has significant tightness on  the left side of the parascapular region.  Patient does have some mild limited range of motion noted.  Osteopathic findings  C2 flexed rotated and side bent right C6 flexed rotated and side bent left T3 extended rotated and side bent right inhaled rib T9 extended rotated and side bent left inhaled rib      Assessment and Plan:  Cervicogenic headache Patient did respond extremely well to the osteopathic manipulation. Patient is going to continue with home exercises.  No other change in medications at the moment.  We will consider such medicines as well.  If necessary.  Has Flexeril for breakthrough pain.  Follow-up again in 6 to 8 weeks    Nonallopathic problems  Decision today to treat with OMT was based on Physical Exam  After verbal consent patient was treated with HVLA, ME, FPR techniques in cervical, rib, thoracic,  areas  Patient tolerated the procedure well with improvement in symptoms  Patient given exercises, stretches and lifestyle modifications  See medications in patient instructions if given  Patient will follow up in 4-8 weeks     The above documentation has been reviewed and is accurate and complete , DO         Note: This dictation was prepared with Dragon dictation along with smaller phrase technology. Any transcriptional errors that result from this process are unintentional.

## 2022-04-26 ENCOUNTER — Ambulatory Visit (INDEPENDENT_AMBULATORY_CARE_PROVIDER_SITE_OTHER): Payer: Medicaid Other | Admitting: Family Medicine

## 2022-04-26 VITALS — BP 94/70 | HR 67 | Ht 64.0 in | Wt 154.0 lb

## 2022-04-26 DIAGNOSIS — M9902 Segmental and somatic dysfunction of thoracic region: Secondary | ICD-10-CM | POA: Diagnosis not present

## 2022-04-26 DIAGNOSIS — M9908 Segmental and somatic dysfunction of rib cage: Secondary | ICD-10-CM | POA: Diagnosis not present

## 2022-04-26 DIAGNOSIS — G4486 Cervicogenic headache: Secondary | ICD-10-CM | POA: Diagnosis not present

## 2022-04-26 DIAGNOSIS — M9901 Segmental and somatic dysfunction of cervical region: Secondary | ICD-10-CM

## 2022-04-26 NOTE — Patient Instructions (Signed)
Keep watching positioning  Enjoy the gig See me in 5-6 weeks

## 2022-04-26 NOTE — Assessment & Plan Note (Signed)
Patient did respond extremely well to the osteopathic manipulation. Patient is going to continue with home exercises.  No other change in medications at the moment.  We will consider such medicines as well.  If necessary.  Has Flexeril for breakthrough pain.  Follow-up again in 6 to 8 weeks

## 2022-05-12 ENCOUNTER — Other Ambulatory Visit: Payer: Self-pay | Admitting: Neurology

## 2022-05-22 ENCOUNTER — Ambulatory Visit: Payer: Medicaid Other | Admitting: Neurology

## 2022-05-23 ENCOUNTER — Ambulatory Visit (INDEPENDENT_AMBULATORY_CARE_PROVIDER_SITE_OTHER): Payer: Medicaid Other | Admitting: Neurology

## 2022-05-23 ENCOUNTER — Encounter: Payer: Self-pay | Admitting: *Deleted

## 2022-05-23 ENCOUNTER — Ambulatory Visit: Payer: Medicaid Other | Admitting: Neurology

## 2022-05-23 DIAGNOSIS — G43711 Chronic migraine without aura, intractable, with status migrainosus: Secondary | ICD-10-CM | POA: Diagnosis not present

## 2022-05-23 MED ORDER — RIZATRIPTAN BENZOATE 10 MG PO TBDP: 10.0000 mg | ORAL_TABLET | Freq: Once | ORAL | 3 refills | Status: DC | PRN

## 2022-05-23 MED ORDER — NURTEC 75 MG PO TBDP: 75.0000 mg | ORAL_TABLET | Freq: Every day | ORAL | 11 refills | Status: DC | PRN

## 2022-05-23 MED ORDER — ONABOTULINUMTOXINA 200 UNITS IJ SOLR
155.0000 [IU] | Freq: Once | INTRAMUSCULAR | Status: AC
  Administered 2022-05-23: 155 [IU] via INTRAMUSCULAR

## 2022-05-23 NOTE — Progress Notes (Signed)
Consent Form Botulism Toxin Injection For Chronic Migraine  05/23/2022: Stable still doing very well. She has > 50% improvement in migraine frequency and severity. Now with botox she has 4 migraine days a month and < 8 total headache days a month.   Meds ordered this encounter  Medications   botulinum toxin Type A (BOTOX) injection 155 Units    Botox- 200 units x 1 vial Lot: [id number] Expiration: 09/2024 NDC: [id number]  Bacteriostatic 0.9% Sodium Chloride- 91mL total Lot: [id number] Expiration: 03/14/2023 NDC: [id number]  Dx: [id number] B/B   Rimegepant Sulfate (NURTEC) 75 MG TBDP    Sig: Take 75 mg by mouth daily as needed (migraine). Take as close to onset of migraine as possible. Do not take more than 1 tablet per day.    Dispense:  16 tablet    Refill:  11   rizatriptan (MAXALT-MLT) 10 MG disintegrating tablet    Sig: Dissolve 1 tablet (10 mg total) by mouth once as needed for migraine. May repeat in 2 hours if needed    Dispense:  18 tablet    Refill:  3    719/2023: going great, stable, >> 50% decrease in freq and severity of migraines and headaches.   4/5/2023Janett Billow McCue 08/23/2021: doing great as far migraines go but she continues to have cervical myofascial pain, musculoskletal neck pain, has tried dry needling, PT, muscle relaxers. Refer to specialist.. MRI cervical spine 2018 was normal. Personally reviewed images with patient (additional 10 minutes on personal review on CD in our office if no improvement recommend repeat MRI cervical spine worsening pain, decreased ROM, crepitus) MRI c-spine 2018: :  Metal artifact slightly obscures the upper anterior spine. On sagittal images, the spine is imaged from above the cervicomedullary junction to T2.   The spinal cord is of normal caliber and signal.   There is some straightening of the cervical curvature. No spondylolisthesis is noted..   The vertebral bodies have normal signal.  The discs and interspaces were further  evaluated on axial views from C2 to T1.   The discs were normal at each of the cervical levels. There was no spondylosis. The neural foramina are widely patent at every level and there is no nerve root compression.After the infusion of contrast, a normal enhancement pattern was observed IMPRESSION:  This is a normal MRI of the cervical spine with and without contrast.  No orders of the defined types were placed in this encounter.  I spent 20 minutes of face-to-face and non-face-to-face time with patient on the  No diagnosis found.  diagnosis.  This included previsit chart review, lab review, study review, order entry, electronic health record documentation, patient education on the different diagnostic and therapeutic options, counseling and coordination of care, risks and benefits of management, compliance, or risk factor reduction. This does not include time spent on botox.    05/27/2021: Stable, still excellent response, > 80% improvement with freg of migraines   Reviewed orally with patient, additionally signature is on file:  Botulism toxin has been approved by the Federal drug administration for treatment of chronic migraine. Botulism toxin does not cure chronic migraine and it may not be effective in some patients.  The administration of botulism toxin is accomplished by injecting a small amount of toxin into the muscles of the neck and head. Dosage must be titrated for each individual. Any benefits resulting from botulism toxin tend to wear off after 3 months with a repeat injection required if benefit is  to be maintained. Injections are usually done every 3-4 months with maximum effect peak achieved by about 2 or 3 weeks. Botulism toxin is expensive and you should be sure of what costs you will incur resulting from the injection.  The side effects of botulism toxin use for chronic migraine may include:   -Transient, and usually mild, facial weakness with facial injections  -Transient,  and usually mild, head or neck weakness with head/neck injections  -Reduction or loss of forehead facial animation due to forehead muscle weakness  -Eyelid drooping  -Dry eye  -Pain at the site of injection or bruising at the site of injection  -Double vision  -Potential unknown long term risks  Contraindications: You should not have Botox if you are pregnant, nursing, allergic to albumin, have an infection, skin condition, or muscle weakness at the site of the injection, or have myasthenia gravis, Lambert-Eaton syndrome, or ALS.  It is also possible that as with any injection, there may be an allergic reaction or no effect from the medication. Reduced effectiveness after repeated injections is sometimes seen and rarely infection at the injection site may occur. All care will be taken to prevent these side effects. If therapy is given over a long time, atrophy and wasting in the muscle injected may occur. Occasionally the patient's become refractory to treatment because they develop antibodies to the toxin. In this event, therapy needs to be modified.  I have read the above information and consent to the administration of botulism toxin.    BOTOX PROCEDURE NOTE FOR MIGRAINE HEADACHE    Contraindications and precautions discussed with patient(above). Aseptic procedure was observed and patient tolerated procedure. Procedure performed by Dr. Georgia Dom  The condition has existed for more than 6 months, and pt does not have a diagnosis of ALS, Myasthenia Gravis or Lambert-Eaton Syndrome.  Risks and benefits of injections discussed and pt agrees to proceed with the procedure.  Written consent obtained  These injections are medically necessary. Pt  receives good benefits from these injections. These injections do not cause sedations or hallucinations which the oral therapies may cause.  Description of procedure:  The patient was placed in a sitting position. The standard protocol was used for  Botox as follows, with 5 units of Botox injected at each site:   -Procerus muscle, midline injection  -Corrugator muscle, bilateral injection  -Frontalis muscle, bilateral injection, with 2 sites each side, medial injection was performed in the upper one third of the frontalis muscle, in the region vertical from the medial inferior edge of the superior orbital rim. The lateral injection was again in the upper one third of the forehead vertically above the lateral limbus of the cornea, 1.5 cm lateral to the medial injection site.  - Levator Scapulae: 5 units bilaterally  -Temporalis muscle injection, 5 sites, bilaterally. The first injection was 3 cm above the tragus of the ear, second injection site was 1.5 cm to 3 cm up from the first injection site in line with the tragus of the ear. The third injection site was 1.5-3 cm forward between the first 2 injection sites. The fourth injection site was 1.5 cm posterior to the second injection site. 5th site laterally in the temporalis  muscleat the level of the outer canthus.  - Patient feels her clenching is a trigger for headaches. +5 units masseter bilaterally   - Patient feels the migraines are centered around the eyes +5 units bilaterally at the outer canthus in the orbicularis occuli  -  Occipitalis muscle injection, 3 sites, bilaterally. The first injection was done one half way between the occipital protuberance and the tip of the mastoid process behind the ear. The second injection site was done lateral and superior to the first, 1 fingerbreadth from the first injection. The third injection site was 1 fingerbreadth superiorly and medially from the first injection site.  -Cervical paraspinal muscle injection, 2 sites, bilateral knee first injection site was 1 cm from the midline of the cervical spine, 3 cm inferior to the lower border of the occipital protuberance. The second injection site was 1.5 cm superiorly and laterally to the first  injection site.  -Trapezius muscle injection was performed at 3 sites, bilaterally. The first injection site was in the upper trapezius muscle halfway between the inflection point of the neck, and the acromion. The second injection site was one half way between the acromion and the first injection site. The third injection was done between the first injection site and the inflection point of the neck.   Will return for repeat injection in 3 months.   155 unit sof Botox was used, 45U Botox not injected was wasted. The patient tolerated the procedure well, there were no complications of the above procedure.

## 2022-05-23 NOTE — Progress Notes (Signed)
Botox- 200 units x 1 vial Lot: [id number] Expiration: 09/2024 NDC: 0023-3921-02  Bacteriostatic 0.9% Sodium Chloride- 4mL total Lot: [id number] Expiration: 03/14/2023 NDC: 0409-1966-02  Dx: [id number] B/B  

## 2022-05-28 ENCOUNTER — Other Ambulatory Visit: Payer: Self-pay | Admitting: Family

## 2022-05-29 ENCOUNTER — Other Ambulatory Visit: Payer: Self-pay | Admitting: Family

## 2022-05-29 DIAGNOSIS — N6320 Unspecified lump in the left breast, unspecified quadrant: Secondary | ICD-10-CM

## 2022-05-29 DIAGNOSIS — N6310 Unspecified lump in the right breast, unspecified quadrant: Secondary | ICD-10-CM

## 2022-06-01 ENCOUNTER — Ambulatory Visit: Payer: Medicaid Other | Admitting: Family Medicine

## 2022-06-04 ENCOUNTER — Telehealth: Payer: Self-pay | Admitting: *Deleted

## 2022-06-04 NOTE — Telephone Encounter (Signed)
Nurtec PA< Key: [id number], [id number]. Your information has been sent to [facility].

## 2022-06-04 NOTE — Telephone Encounter (Addendum)
Joelene Millin is calling from [employer]. Stated she need to know if Pt has had 4 or more migraine in past 4 months. She is requesting chart notes. Please fax to [phone].

## 2022-06-04 NOTE — Telephone Encounter (Signed)
Faxed Dr Cathren Laine office note dated 05/23/22. Received confirmation.

## 2022-06-04 NOTE — Telephone Encounter (Signed)
Received fax from [facility]: denied Nurtec. "We show that you have primary pharmacy benefit through another plan. Pharmacy should bill through primary plan before billing [place]" [place], spoke with PA dept, Melissa who stated they only have [employer] as her insurance. She stated they will reach out to patient. I advised will send her a my chart. Melissa verbalized understanding, appreciation. I sent my chart asking if she has another insurance.

## 2022-06-12 NOTE — Telephone Encounter (Addendum)
Called [employer], spoke with Michelle Rhodes who transferred me to [facility], pharmacy services who stated he sees she has active pharmacy benefits with with Rx benefits:  ID [id number], bin [id number], pcn irx, group rxbenefit Will submit new PA on CMM.  Received message:  Cannot find matching patient with Name and Date of Birth provided. For additional information, please contact the phone number on the back of the member prescription ID card. This is same # I have called earlier when I spoke to May, pharmacy.  Called [facility], spoke with Michelle Rhodes who stated the only insurance they have for her is [employer]. Attempted PA on CMM again with [employer] form, received " System was not able to process the request because the previous Prior Authorization Request was Denied." Called patient and advised her of situation. I advised she call Medicaid and tell them she no longer has previous plan so PA will go through with [facility]. She stated she'll call this week and let us know after she has taken care of it.

## 2022-06-12 NOTE — Telephone Encounter (Signed)
Received fax from [facility] : Nurtec denied because following criteria not met: beneficiary has 4 or more migraine days per month for at least 3 months. Called [employer], unable to get to advocate to discuss.

## 2022-07-20 ENCOUNTER — Other Ambulatory Visit: Payer: Medicaid Other

## 2022-07-22 ENCOUNTER — Other Ambulatory Visit: Payer: Self-pay | Admitting: Neurology

## 2022-08-10 ENCOUNTER — Ambulatory Visit
Admission: RE | Admit: 2022-08-10 | Discharge: 2022-08-10 | Disposition: A | Discharge status: Home / Self Care | Payer: Medicaid Other | Source: Ambulatory Visit | Attending: Family | Admitting: Family

## 2022-08-10 DIAGNOSIS — N6320 Unspecified lump in the left breast, unspecified quadrant: Secondary | ICD-10-CM

## 2022-08-10 DIAGNOSIS — N6310 Unspecified lump in the right breast, unspecified quadrant: Secondary | ICD-10-CM

## 2022-08-20 ENCOUNTER — Telehealth: Payer: Self-pay | Admitting: *Deleted

## 2022-08-20 NOTE — Telephone Encounter (Signed)
I called the pt & LVM (ok per DPR) with office number asking for call back asap to confirm which insurance she has and if its changed for 2024. She has a botox appointment tomorrow afternoon 1/9 at 4 pm. Can call back or send mychart message.

## 2022-08-21 ENCOUNTER — Telehealth: Payer: Self-pay | Admitting: Neurology

## 2022-08-21 ENCOUNTER — Ambulatory Visit: Payer: Medicaid Other | Admitting: Neurology

## 2022-08-21 NOTE — Telephone Encounter (Signed)
LVM and sent mychart msg asking pt about rescheduling 08/21/22 appointment due to the weather

## 2022-08-21 NOTE — Telephone Encounter (Signed)
Spoke with Nieka @ [facility] Medicaid. She confirmed no Josem Kaufmann is required for [id number] and [phone], bill under medical benefit buy and bill. Call reference # [id number].

## 2022-08-23 ENCOUNTER — Ambulatory Visit (INDEPENDENT_AMBULATORY_CARE_PROVIDER_SITE_OTHER): Payer: Medicaid Other | Admitting: Neurology

## 2022-08-23 DIAGNOSIS — G43711 Chronic migraine without aura, intractable, with status migrainosus: Secondary | ICD-10-CM | POA: Diagnosis not present

## 2022-08-23 MED ORDER — ONABOTULINUMTOXINA 200 UNITS IJ SOLR
155.0000 [IU] | Freq: Once | INTRAMUSCULAR | Status: AC
  Administered 2022-08-23: 155 [IU] via INTRAMUSCULAR

## 2022-08-23 MED ORDER — NURTEC 75 MG PO TBDP: 75.0000 mg | ORAL_TABLET | Freq: Every day | ORAL | 11 refills | Status: DC | PRN

## 2022-08-23 NOTE — Progress Notes (Signed)
Botox- 200 units x 1 vial Lot: [id number] Expiration: 12/2024 NDC: 0023-3921-02  Bacteriostatic 0.9% Sodium Chloride- 4mL total Lot: [id number]   Expiration: 04/14/2023 NDC: 0409-1966-02  Dx: G43.711 B/B  

## 2022-08-23 NOTE — Progress Notes (Signed)
Consent Form Botulism Toxin Injection For Chronic Migraine  05/23/2022: Stable still doing very well. She has > 50% improvement in migraine frequency and severity. Now with botox she has 4 migraine days a month and < 8 total headache days a month.   05/23/2022: Stable still doing very well. She has > 50% improvement in migraine frequency and severity. Now with botox she has 4 migraine days a month and < 8 total headache days a month.   Meds ordered this encounter  Medications   botulinum toxin Type A (BOTOX) injection 155 Units    Botox- 200 units x 1 vial Lot: [id number] Expiration: 12/2024 NDC: 1194-1740-81  Bacteriostatic 0.9% Sodium Chloride- 69mL total Lot: [id number] Expiration: 04/14/2023 NDC: 6314-9702-63  Dx: Z85.885  B/B    719/2023: going great, stable, >> 50% decrease in freq and severity of migraines and headaches.   4/5/2023Janett Billow McCue 08/23/2021: doing great as far migraines go but she continues to have cervical myofascial pain, musculoskletal neck pain, has tried dry needling, PT, muscle relaxers. Refer to specialist.. MRI cervical spine 2018 was normal. Personally reviewed images with patient (additional 10 minutes on personal review on CD in our office if no improvement recommend repeat MRI cervical spine worsening pain, decreased ROM, crepitus) MRI c-spine 2018: :  Metal artifact slightly obscures the upper anterior spine. On sagittal images, the spine is imaged from above the cervicomedullary junction to T2.   The spinal cord is of normal caliber and signal.   There is some straightening of the cervical curvature. No spondylolisthesis is noted..   The vertebral bodies have normal signal.  The discs and interspaces were further evaluated on axial views from C2 to T1.   The discs were normal at each of the cervical levels. There was no spondylosis. The neural foramina are widely patent at every level and there is no nerve root compression.After the infusion of contrast, a  normal enhancement pattern was observed IMPRESSION:  This is a normal MRI of the cervical spine with and without contrast.  No orders of the defined types were placed in this encounter.  I spent 20 minutes of face-to-face and non-face-to-face time with patient on the  1. Chronic migraine without aura, with intractable migraine, so stated, with status migrainosus     diagnosis.  This included previsit chart review, lab review, study review, order entry, electronic health record documentation, patient education on the different diagnostic and therapeutic options, counseling and coordination of care, risks and benefits of management, compliance, or risk factor reduction. This does not include time spent on botox.    05/27/2021: Stable, still excellent response, > 80% improvement with freg of migraines   Reviewed orally with patient, additionally signature is on file:  Botulism toxin has been approved by the Federal drug administration for treatment of chronic migraine. Botulism toxin does not cure chronic migraine and it may not be effective in some patients.  The administration of botulism toxin is accomplished by injecting a small amount of toxin into the muscles of the neck and head. Dosage must be titrated for each individual. Any benefits resulting from botulism toxin tend to wear off after 3 months with a repeat injection required if benefit is to be maintained. Injections are usually done every 3-4 months with maximum effect peak achieved by about 2 or 3 weeks. Botulism toxin is expensive and you should be sure of what costs you will incur resulting from the injection.  The side effects of botulism toxin use  for chronic migraine may include:   -Transient, and usually mild, facial weakness with facial injections  -Transient, and usually mild, head or neck weakness with head/neck injections  -Reduction or loss of forehead facial animation due to forehead muscle weakness  -Eyelid  drooping  -Dry eye  -Pain at the site of injection or bruising at the site of injection  -Double vision  -Potential unknown long term risks  Contraindications: You should not have Botox if you are pregnant, nursing, allergic to albumin, have an infection, skin condition, or muscle weakness at the site of the injection, or have myasthenia gravis, Lambert-Eaton syndrome, or ALS.  It is also possible that as with any injection, there may be an allergic reaction or no effect from the medication. Reduced effectiveness after repeated injections is sometimes seen and rarely infection at the injection site may occur. All care will be taken to prevent these side effects. If therapy is given over a long time, atrophy and wasting in the muscle injected may occur. Occasionally the patient's become refractory to treatment because they develop antibodies to the toxin. In this event, therapy needs to be modified.  I have read the above information and consent to the administration of botulism toxin.    BOTOX PROCEDURE NOTE FOR MIGRAINE HEADACHE    Contraindications and precautions discussed with patient(above). Aseptic procedure was observed and patient tolerated procedure. Procedure performed by Dr. Artemio Aly  The condition has existed for more than 6 months, and pt does not have a diagnosis of ALS, Myasthenia Gravis or Lambert-Eaton Syndrome.  Risks and benefits of injections discussed and pt agrees to proceed with the procedure.  Written consent obtained  These injections are medically necessary. Pt  receives good benefits from these injections. These injections do not cause sedations or hallucinations which the oral therapies may cause.  Description of procedure:  The patient was placed in a sitting position. The standard protocol was used for Botox as follows, with 5 units of Botox injected at each site:   -Procerus muscle, midline injection  -Corrugator muscle, bilateral  injection  -Frontalis muscle, bilateral injection, with 2 sites each side, medial injection was performed in the upper one third of the frontalis muscle, in the region vertical from the medial inferior edge of the superior orbital rim. The lateral injection was again in the upper one third of the forehead vertically above the lateral limbus of the cornea, 1.5 cm lateral to the medial injection site.  - Levator Scapulae: 5 units bilaterally  -Temporalis muscle injection, 5 sites, bilaterally. The first injection was 3 cm above the tragus of the ear, second injection site was 1.5 cm to 3 cm up from the first injection site in line with the tragus of the ear. The third injection site was 1.5-3 cm forward between the first 2 injection sites. The fourth injection site was 1.5 cm posterior to the second injection site. 5th site laterally in the temporalis  muscleat the level of the outer canthus.  - Patient feels her clenching is a trigger for headaches. +5 units masseter bilaterally   - Patient feels the migraines are centered around the eyes +5 units bilaterally at the outer canthus in the orbicularis occuli  -Occipitalis muscle injection, 3 sites, bilaterally. The first injection was done one half way between the occipital protuberance and the tip of the mastoid process behind the ear. The second injection site was done lateral and superior to the first, 1 fingerbreadth from the first injection. The third  injection site was 1 fingerbreadth superiorly and medially from the first injection site.  -Cervical paraspinal muscle injection, 2 sites, bilateral knee first injection site was 1 cm from the midline of the cervical spine, 3 cm inferior to the lower border of the occipital protuberance. The second injection site was 1.5 cm superiorly and laterally to the first injection site.  -Trapezius muscle injection was performed at 3 sites, bilaterally. The first injection site was in the upper trapezius muscle  halfway between the inflection point of the neck, and the acromion. The second injection site was one half way between the acromion and the first injection site. The third injection was done between the first injection site and the inflection point of the neck.   Will return for repeat injection in 3 months.   155 unit sof Botox was used, 45U Botox not injected was wasted. The patient tolerated the procedure well, there were no complications of the above procedure.

## 2022-08-27 ENCOUNTER — Telehealth: Payer: Self-pay | Admitting: Neurology

## 2022-08-27 NOTE — Telephone Encounter (Signed)
Mychart Message sent.

## 2022-11-01 DIAGNOSIS — D485 Neoplasm of uncertain behavior of skin: Secondary | ICD-10-CM | POA: Diagnosis not present

## 2022-11-01 DIAGNOSIS — B079 Viral wart, unspecified: Secondary | ICD-10-CM | POA: Diagnosis not present

## 2022-11-15 DIAGNOSIS — L718 Other rosacea: Secondary | ICD-10-CM | POA: Diagnosis not present

## 2022-11-20 ENCOUNTER — Ambulatory Visit (INDEPENDENT_AMBULATORY_CARE_PROVIDER_SITE_OTHER): Payer: 59 | Admitting: Neurology

## 2022-11-20 ENCOUNTER — Other Ambulatory Visit: Payer: Self-pay | Admitting: *Deleted

## 2022-11-20 DIAGNOSIS — G43711 Chronic migraine without aura, intractable, with status migrainosus: Secondary | ICD-10-CM

## 2022-11-20 MED ORDER — ONABOTULINUMTOXINA 200 UNITS IJ SOLR
155.0000 [IU] | Freq: Once | INTRAMUSCULAR | Status: AC
  Administered 2022-11-20: 155 [IU] via INTRAMUSCULAR

## 2022-11-20 NOTE — Progress Notes (Signed)
Botox- 200 units x 1 vial Lot: [id number] Expiration: 01/2025 NDC: 0211-1552-08  Bacteriostatic 0.9% Sodium Chloride- 4 mL total Lot: [id number] Expiration: 06/2024 NDC: 22449-753-00  Dx: G43.711  B/B Witnessed by Raynald Kemp, CMA

## 2022-11-20 NOTE — Progress Notes (Signed)
Consent Form Botulism Toxin Injection For Chronic Migraine 11/20/2022: Stable 08/23/2022: Stable still doing very well. She has > 50% improvement in migraine frequency and severity. Now with botox she has 4 migraine days a month and < 8 total headache days a month.   05/23/2022: Stable still doing very well. She has > 50% improvement in migraine frequency and severity. Now with botox she has 4 migraine days a month and < 8 total headache days a month.   Meds ordered this encounter  Medications   botulinum toxin Type A (BOTOX) injection 155 Units    Botox- 200 units x 1 vial Lot: [id number] Expiration: 01/2025 NDC: 9449-6759-16   Bacteriostatic 0.9% Sodium Chloride- 4 mL total Lot: [id number] Expiration: 06/2024 NDC: 93570-177-93   Dx: G43.711  B/B Witnessed by Raynald Kemp, CMA    719/2023: going great, stable, >> 50% decrease in freq and severity of migraines and headaches.   4/5/2023Shanda Bumps McCue 08/23/2021: doing great as far migraines go but she continues to have cervical myofascial pain, musculoskletal neck pain, has tried dry needling, PT, muscle relaxers. Refer to specialist.. MRI cervical spine 2018 was normal. Personally reviewed images with patient (additional 10 minutes on personal review on CD in our office if no improvement recommend repeat MRI cervical spine worsening pain, decreased ROM, crepitus) MRI c-spine 2018: :  Metal artifact slightly obscures the upper anterior spine. On sagittal images, the spine is imaged from above the cervicomedullary junction to T2.   The spinal cord is of normal caliber and signal.   There is some straightening of the cervical curvature. No spondylolisthesis is noted..   The vertebral bodies have normal signal.  The discs and interspaces were further evaluated on axial views from C2 to T1.   The discs were normal at each of the cervical levels. There was no spondylosis. The neural foramina are widely patent at every level and there is no nerve  root compression.After the infusion of contrast, a normal enhancement pattern was observed IMPRESSION:  This is a normal MRI of the cervical spine with and without contrast.  No orders of the defined types were placed in this encounter.  I spent 20 minutes of face-to-face and non-face-to-face time with patient on the  1. Chronic migraine without aura, with intractable migraine, so stated, with status migrainosus     diagnosis.  This included previsit chart review, lab review, study review, order entry, electronic health record documentation, patient education on the different diagnostic and therapeutic options, counseling and coordination of care, risks and benefits of management, compliance, or risk factor reduction. This does not include time spent on botox.    05/27/2021: Stable, still excellent response, > 80% improvement with freg of migraines   Reviewed orally with patient, additionally signature is on file:  Botulism toxin has been approved by the Federal drug administration for treatment of chronic migraine. Botulism toxin does not cure chronic migraine and it may not be effective in some patients.  The administration of botulism toxin is accomplished by injecting a small amount of toxin into the muscles of the neck and head. Dosage must be titrated for each individual. Any benefits resulting from botulism toxin tend to wear off after 3 months with a repeat injection required if benefit is to be maintained. Injections are usually done every 3-4 months with maximum effect peak achieved by about 2 or 3 weeks. Botulism toxin is expensive and you should be sure of what costs you will incur resulting from the  injection.  The side effects of botulism toxin use for chronic migraine may include:   -Transient, and usually mild, facial weakness with facial injections  -Transient, and usually mild, head or neck weakness with head/neck injections  -Reduction or loss of forehead facial animation  due to forehead muscle weakness  -Eyelid drooping  -Dry eye  -Pain at the site of injection or bruising at the site of injection  -Double vision  -Potential unknown long term risks  Contraindications: You should not have Botox if you are pregnant, nursing, allergic to albumin, have an infection, skin condition, or muscle weakness at the site of the injection, or have myasthenia gravis, Lambert-Eaton syndrome, or ALS.  It is also possible that as with any injection, there may be an allergic reaction or no effect from the medication. Reduced effectiveness after repeated injections is sometimes seen and rarely infection at the injection site may occur. All care will be taken to prevent these side effects. If therapy is given over a long time, atrophy and wasting in the muscle injected may occur. Occasionally the patient's become refractory to treatment because they develop antibodies to the toxin. In this event, therapy needs to be modified.  I have read the above information and consent to the administration of botulism toxin.    BOTOX PROCEDURE NOTE FOR MIGRAINE HEADACHE    Contraindications and precautions discussed with patient(above). Aseptic procedure was observed and patient tolerated procedure. Procedure performed by Dr. Artemio Aly  The condition has existed for more than 6 months, and pt does not have a diagnosis of ALS, Myasthenia Gravis or Lambert-Eaton Syndrome.  Risks and benefits of injections discussed and pt agrees to proceed with the procedure.  Written consent obtained  These injections are medically necessary. Pt  receives good benefits from these injections. These injections do not cause sedations or hallucinations which the oral therapies may cause.  Description of procedure:  The patient was placed in a sitting position. The standard protocol was used for Botox as follows, with 5 units of Botox injected at each site:   -Procerus muscle, midline  injection  -Corrugator muscle, bilateral injection  -Frontalis muscle, bilateral injection, with 2 sites each side, medial injection was performed in the upper one third of the frontalis muscle, in the region vertical from the medial inferior edge of the superior orbital rim. The lateral injection was again in the upper one third of the forehead vertically above the lateral limbus of the cornea, 1.5 cm lateral to the medial injection site.  - Levator Scapulae: 5 units bilaterally  -Temporalis muscle injection, 5 sites, bilaterally. The first injection was 3 cm above the tragus of the ear, second injection site was 1.5 cm to 3 cm up from the first injection site in line with the tragus of the ear. The third injection site was 1.5-3 cm forward between the first 2 injection sites. The fourth injection site was 1.5 cm posterior to the second injection site. 5th site laterally in the temporalis  muscleat the level of the outer canthus.  - Patient feels her clenching is a trigger for headaches. +5 units masseter bilaterally   - Patient feels the migraines are centered around the eyes +5 units bilaterally at the outer canthus in the orbicularis occuli  -Occipitalis muscle injection, 3 sites, bilaterally. The first injection was done one half way between the occipital protuberance and the tip of the mastoid process behind the ear. The second injection site was done lateral and superior to the  first, 1 fingerbreadth from the first injection. The third injection site was 1 fingerbreadth superiorly and medially from the first injection site.  -Cervical paraspinal muscle injection, 2 sites, bilateral knee first injection site was 1 cm from the midline of the cervical spine, 3 cm inferior to the lower border of the occipital protuberance. The second injection site was 1.5 cm superiorly and laterally to the first injection site.  -Trapezius muscle injection was performed at 3 sites, bilaterally. The first  injection site was in the upper trapezius muscle halfway between the inflection point of the neck, and the acromion. The second injection site was one half way between the acromion and the first injection site. The third injection was done between the first injection site and the inflection point of the neck.   Will return for repeat injection in 3 months.   155 unit sof Botox was used, 45U Botox not injected was wasted. The patient tolerated the procedure well, there were no complications of the above procedure.

## 2022-11-21 ENCOUNTER — Telehealth: Payer: Self-pay | Admitting: *Deleted

## 2022-11-21 NOTE — Telephone Encounter (Signed)
Approved. 11-21-22 thru 05-23-23.  Sent to [facility] Nurtec 75mg  max approved daily 0.6. [employer] PA # [id number].

## 2022-11-21 NOTE — Telephone Encounter (Signed)
Initiated NURTEC PA on CMM KEY [id number]  G43.711. tried sumatriptan and rizatriptan. Acute Migraine.  Pending determination.

## 2022-11-23 ENCOUNTER — Other Ambulatory Visit: Payer: Self-pay | Admitting: Neurology

## 2022-11-26 MED ORDER — NURTEC 75 MG PO TBDP: 75.0000 mg | ORAL_TABLET | Freq: Every day | ORAL | 11 refills | Status: DC | PRN

## 2022-11-26 NOTE — Addendum Note (Signed)
Addended by: Guy Begin on: 11/26/2022 05:34 PM   Modules accepted: Orders

## 2022-11-26 NOTE — Addendum Note (Signed)
Addended by: Guy Begin on: 11/26/2022 05:32 PM   Modules accepted: Orders

## 2022-11-28 ENCOUNTER — Other Ambulatory Visit: Payer: Self-pay | Admitting: Neurology

## 2022-11-29 MED ORDER — CYCLOBENZAPRINE HCL 10 MG PO TABS: 10.0000 mg | ORAL_TABLET | Freq: Every day | ORAL | 0 refills | Status: DC

## 2022-12-04 NOTE — Telephone Encounter (Signed)
Redoing PA for Nurtec 16 tabs/ 30days.  KEY [id number].  Determination pending.

## 2022-12-06 NOTE — Telephone Encounter (Signed)
APPROVAL for 16 tab/ 30 days thru 05-23-2023. REF PA [id number].    [employer].

## 2022-12-11 NOTE — Telephone Encounter (Signed)
Alison Stalling, pt now has [employer] as primary insurance. Are you able to do a retro auth under that insurance for 4/9 Botox appointment please? Thank you!

## 2022-12-26 ENCOUNTER — Telehealth: Payer: Self-pay | Admitting: Family

## 2022-12-26 NOTE — Telephone Encounter (Signed)
Ok to schedule - I saw pt's mom Luetta Nutting before she passed away 2022-01-16

## 2022-12-26 NOTE — Progress Notes (Unsigned)
[facility] [address] [place] [address] Phone: [phone] Subjective:   Michelle [facility], am serving as a scribe for Dr. Antoine Primas.  I'm seeing this patient by the request  of:  Erskine Emery, NP  CC: neck and back pain follow up   NGE:[id number]  Michelle Rhodes is a 42 y.o. female coming in with complaint of back and neck pain. OMT on 04/26/2022. Patient states doing okay. Trigger point injections really helped last time. No new concerns. Patient states that since her mother died she became less focused on herself.  Having worsening pain and migraines again.  Getting Botox but has not been quite as beneficial.  Patient states that has headaches almost every day at the moment.  Medications patient has been prescribed:   Taking:         Reviewed prior external information including notes and imaging from previsou exam, outside providers and external EMR if available.   As well as notes that were available from care everywhere and other healthcare systems.  Past medical history, social, surgical and family history all reviewed in electronic medical record.  No pertanent information unless stated regarding to the chief complaint.   Past Medical History:  Diagnosis Date   Anxiety    Asthma    as a child   Migraine     No Known Allergies   Review of Systems:  No headache, visual changes, nausea, vomiting, diarrhea, constipation, dizziness, abdominal pain, skin rash, fevers, chills, night sweats, weight loss, swollen lymph nodes, body aches, joint swelling, chest pain, shortness of breath, mood changes. POSITIVE muscle aches  Objective  Blood pressure 102/60, pulse 70, height 5\' 4"  (1.626 m), weight 145 lb (65.8 kg), SpO2 97 %.   General: No apparent distress alert and oriented x3 mood and affect normal, dressed appropriately.  HEENT: Pupils equal, extraocular movements intact  Respiratory: Patient's speak in full sentences and  does not appear short of breath  Cardiovascular: No lower extremity edema, non tender, no erythema  Gait normal  MSK:  Neck exam shows significant tightness noted in the paraspinal musculature. Back back shows upper back and thoracic area does have multiple trigger points noted that seems to be more on the right and left rhomboid, levator scapula and trapezius muscles.  Osteopathic findings  C2 flexed rotated and side bent right C6 flexed rotated and side bent left T3 extended rotated and side bent left inhaled rib T9 extended rotated and side bent left inhaled. L2 flexed rotated and side bent right Sacrum right on right  After verbal consent patient was prepped with alcohol swab and with a 25-gauge half inch needle injected into 4 distinct trigger points in the levator scapula, trapezius, rhomboid muscles.  A total of 3 cc of 0.5% Marcaine and 1 cc of Kenalog 40 mg/mL used.  No blood loss.  Band-Aid placed.  Postinjection instructions given     Assessment and Plan:  Trigger point of shoulder region Patient made improvement with this as well.  Discussed icing regimen and home exercises, discussed which activities to do and which ones to avoid.  Discussed the potential for some atrophy of the area if this does occur in her back.  Follow-up with me again 6 to 8 weeks with patient responding to manipulation.    Nonallopathic problems  Decision today to treat with OMT was based on Physical Exam  After verbal consent patient was treated with HVLA, ME, FPR techniques in  cervical, rib, thoracic, lumbar, and sacral  areas  Patient tolerated the procedure well with improvement in symptoms  Patient given exercises, stretches and lifestyle modifications  See medications in patient instructions if given  Patient will follow up in 4-8 weeks     The above documentation has been reviewed and is accurate and complete Judi Saa, DO          Note: This dictation was prepared  with Dragon dictation along with smaller phrase technology. Any transcriptional errors that result from this process are unintentional.

## 2022-12-26 NOTE — Telephone Encounter (Signed)
Patient would like to know if Dr Reece Agar will take her on as a new patient ?He was her moms Dr.Ms Michelle Rhodes.

## 2022-12-27 ENCOUNTER — Encounter: Payer: Self-pay | Admitting: Family Medicine

## 2022-12-27 ENCOUNTER — Ambulatory Visit: Payer: 59 | Admitting: Family Medicine

## 2022-12-27 VITALS — BP 102/60 | HR 70 | Ht 64.0 in | Wt 145.0 lb

## 2022-12-27 DIAGNOSIS — M9902 Segmental and somatic dysfunction of thoracic region: Secondary | ICD-10-CM

## 2022-12-27 DIAGNOSIS — M9904 Segmental and somatic dysfunction of sacral region: Secondary | ICD-10-CM | POA: Diagnosis not present

## 2022-12-27 DIAGNOSIS — M9901 Segmental and somatic dysfunction of cervical region: Secondary | ICD-10-CM | POA: Diagnosis not present

## 2022-12-27 DIAGNOSIS — M25519 Pain in unspecified shoulder: Secondary | ICD-10-CM

## 2022-12-27 DIAGNOSIS — M9903 Segmental and somatic dysfunction of lumbar region: Secondary | ICD-10-CM | POA: Diagnosis not present

## 2022-12-27 DIAGNOSIS — M9908 Segmental and somatic dysfunction of rib cage: Secondary | ICD-10-CM

## 2022-12-27 NOTE — Patient Instructions (Addendum)
Trigger point injections today Good to see you Continue to stay active Keep hands in peripheral vision See me in 6 weeks

## 2022-12-27 NOTE — Assessment & Plan Note (Signed)
Patient made improvement with this as well.  Discussed icing regimen and home exercises, discussed which activities to do and which ones to avoid.  Discussed the potential for some atrophy of the area if this does occur in her back.  Follow-up with me again 6 to 8 weeks with patient responding to manipulation.

## 2022-12-27 NOTE — Telephone Encounter (Signed)
LVMTCB and schedule new pt appointment

## 2022-12-28 NOTE — Telephone Encounter (Addendum)
I outreached [employer] and they do not do retro authorizations.I would suggest insurance is verified before doing the procedures in the future.  Call Reference# [id number]

## 2023-01-15 ENCOUNTER — Encounter: Payer: Self-pay | Admitting: Family Medicine

## 2023-01-15 ENCOUNTER — Ambulatory Visit (INDEPENDENT_AMBULATORY_CARE_PROVIDER_SITE_OTHER): Payer: 59 | Admitting: Family Medicine

## 2023-01-15 ENCOUNTER — Other Ambulatory Visit (HOSPITAL_BASED_OUTPATIENT_CLINIC_OR_DEPARTMENT_OTHER): Payer: Self-pay

## 2023-01-15 VITALS — BP 112/72 | HR 67 | Temp 97.4°F | Ht 64.25 in | Wt 142.0 lb

## 2023-01-15 DIAGNOSIS — Z13220 Encounter for screening for lipoid disorders: Secondary | ICD-10-CM | POA: Diagnosis not present

## 2023-01-15 DIAGNOSIS — Z1159 Encounter for screening for other viral diseases: Secondary | ICD-10-CM

## 2023-01-15 DIAGNOSIS — F411 Generalized anxiety disorder: Secondary | ICD-10-CM | POA: Diagnosis not present

## 2023-01-15 DIAGNOSIS — Z131 Encounter for screening for diabetes mellitus: Secondary | ICD-10-CM | POA: Diagnosis not present

## 2023-01-15 DIAGNOSIS — G43711 Chronic migraine without aura, intractable, with status migrainosus: Secondary | ICD-10-CM

## 2023-01-15 DIAGNOSIS — Z136 Encounter for screening for cardiovascular disorders: Secondary | ICD-10-CM | POA: Diagnosis not present

## 2023-01-15 DIAGNOSIS — R5382 Chronic fatigue, unspecified: Secondary | ICD-10-CM | POA: Diagnosis not present

## 2023-01-15 DIAGNOSIS — F419 Anxiety disorder, unspecified: Secondary | ICD-10-CM

## 2023-01-15 MED ORDER — LORAZEPAM 0.5 MG PO TABS
0.5000 mg | ORAL_TABLET | Freq: Two times a day (BID) | ORAL | 0 refills | Status: DC | PRN
  Filled 2023-01-15: qty 30, 15d supply, fill #0

## 2023-01-15 NOTE — Patient Instructions (Addendum)
Trial lorazepam as needed for anxiety.  Continue working on healthy stress relieving strategies. We will refer you to counselor. Return as needed or in 2-3 months for physical, prior for labwork.

## 2023-01-15 NOTE — Progress Notes (Addendum)
Ph: [phone] Fax: [phone]   Patient ID: Michelle Rhodes, female    DOB: 24-May-1981, 42 y.o.   MRN: [id number]  This visit was conducted in person.  BP 112/72   Pulse 67   Temp (!) 97.4 F (36.3 C) (Temporal)   Ht 5' 4.25" (1.632 m)   Wt 142 lb (64.4 kg)   SpO2 99%   BMI 24.18 kg/m    CC: new pt to establish Subjective:   HPI: Michelle Rhodes is a 42 y.o. female presenting on 01/15/2023 for New Patient (Initial Visit)   Previously saw Drusilla Kanner NP with [facility] - insurance did not cover this practice.  I saw pt's grandmother Michelle Rhodes until she passed away 2021/10/30.   Migraines since age 32 yo. Has seen [facility] Dr Lucia Gaskins managed on botox injections Q3 months with Nurtec 75mg  and flexeril 10mg  PRN abortively. May-July are worst months for migraines. MRI brain with and without contrast 2020 - stable scattered foci of non-specific T2 hyperintensities L>R frontal subcortical white matter most likely thought to represent minimal chronic microvascular ischemic change with the sequela of migraine headache.   H/o BSO age 74yo unclear reason. Did not receive HRT. Reviewed records from surgery 2016 - she had BTL for AUB, hyesteroscopy, and novasure ablation. Ovaries remain. I am unable to change surgical history to remove oophorectomy.  Sees GYN - due for f/u   Notes ongoing anxiety issue for years - acutely worse over the past year. Excessive worrying. Becoming overwhelming.  Enjoys walking, cleaning, yardwork, going to gym.  Has previously tried hydroxyzine PRN - didn't help.  Buspar caused nightmares.  Tried zoloft last year - caused SI. Lexapro - no effect.  Has tried meditation.  Trouble sleeping - wakes up at 3am and watches TV. Trouble shutting brain off. Getting 4-5 hours of sleep.  H/o depression.    01/15/2023    3:03 PM  Depression screen PHQ 2/9  Decreased Interest 1  Down, Depressed, Hopeless 2  PHQ - 2 Score 3  Altered sleeping 3  Tired, decreased  energy 0  Change in appetite 0  Feeling bad or failure about yourself  0  Trouble concentrating 2  Moving slowly or fidgety/restless 0  Suicidal thoughts 0  PHQ-9 Score 8  Difficult doing work/chores Somewhat difficult       01/15/2023    3:03 PM  GAD 7 : Generalized Anxiety Score  Nervous, Anxious, on Edge 2  Control/stop worrying 3  Worry too much - different things 3  Trouble relaxing 3  Restless 3  Easily annoyed or irritable 3  Afraid - awful might happen 3  Total GAD 7 Score 20  Anxiety Difficulty Somewhat difficult    Mother with stroke x2 passed away 76s, no known immediate family with brain aneurysms.  Right handed Lives at home with son, daughter and longterm partner. Caffeine use: tea occasionally with dinner Occ: [employer] [place] [facility] and [place] on weekends  Activity: goes to gym 3d/wk Diet: good water, fruits/vegetables daily      Relevant past medical, surgical, family and social history reviewed and updated as indicated. Interim medical history since our last visit reviewed. Allergies and medications reviewed and updated. Outpatient Medications Prior to Visit  Medication Sig Dispense Refill   botulinum toxin Type A (BOTOX) 100 units SOLR injection PROVIDER TO INJECT 155 UNITS INTRAMUSCULARLY INTO HEAD AND NECK EVERY 3 MONTHS. DISCARD REMAINDER. 2 each 0   cyclobenzaprine (FLEXERIL) 10 MG tablet Take 1  tablet (10 mg total) by mouth at bedtime. 90 tablet 0   Rimegepant Sulfate (NURTEC) 75 MG TBDP Take 1 tablet (75 mg total) by mouth daily as needed (migraine). 16 tablet 11   rizatriptan (MAXALT-MLT) 10 MG disintegrating tablet Dissolve 1 tablet (10 mg total) by mouth once as needed for migraine. May repeat in 2 hours if needed 18 tablet 3   Facility-Administered Medications Prior to Visit  Medication Dose Route Frequency Provider Last Rate Last Admin   botulinum toxin Type A (BOTOX) injection 155 Units  155 Units Intramuscular Once Anson Fret, MD         Past Medical History:  Diagnosis Date   Anxiety    Asthma    as a child   Migraine    Past Surgical History:  Procedure Laterality Date   BREAST BIOPSY Left 2012   x 2    BREAST BIOPSY Left 2018   BREAST REDUCTION SURGERY     ENDOMETRIAL ABLATION N/A 01/06/2015   Procedure: ENDOMETRIAL ABLATION;  Surgeon: [place] Bing, MD;  Location: [facility] OR[facility];  Service: Gynecology;  Laterality: N/A;   LAPAROSCOPIC BILATERAL SALPINGO OOPHERECTOMY Bilateral 01/06/2015   LAPAROSCOPIC BILATERAL TUBAL LIGATION with FILSIE CLIPS, novasure ablation, hysteroscopy for AUB [place] Bing, MD)   REDUCTION MAMMAPLASTY Bilateral 2020    Family History  Problem Relation Age of Onset   Hypertension Mother    Stroke Mother        x2, passed away 11s   Hypertension Father    Stroke Paternal Grandmother    CAD Paternal Grandmother    Aneurysm Paternal Grandmother        carotid artery   Colon cancer Paternal Grandfather    Diabetes Neg Hx    Social History   Tobacco Use   Smoking status: Former    Packs/day: 0.25    Years: 10.00    Additional pack years: 0.00    Total pack years: 2.50    Types: Cigarettes    Quit date: 11/11/2016    Years since quitting: 6.2   Smokeless tobacco: Never  Vaping Use   Vaping Use: Every day  Substance Use Topics   Alcohol use: No    Comment: socially   Drug use: No     Per HPI unless specifically indicated in ROS section below Review of Systems  Objective:  BP 112/72   Pulse 67   Temp (!) 97.4 F (36.3 C) (Temporal)   Ht 5' 4.25" (1.632 m)   Wt 142 lb (64.4 kg)   SpO2 99%   BMI 24.18 kg/m   Wt Readings from Last 3 Encounters:  01/15/23 142 lb (64.4 kg)  12/27/22 145 lb (65.8 kg)  04/26/22 154 lb (69.9 kg)      Physical Exam Vitals and nursing note reviewed.  Constitutional:      Appearance: Normal appearance. She is not ill-appearing.  HENT:     Head: Normocephalic and atraumatic.     Mouth/Throat:     Mouth: Mucous membranes are moist.      Pharynx: Oropharynx is clear. No oropharyngeal exudate or posterior oropharyngeal erythema.  Eyes:     Extraocular Movements: Extraocular movements intact.     Conjunctiva/sclera: Conjunctivae normal.     Pupils: Pupils are equal, round, and reactive to light.  Neck:     Thyroid: No thyroid mass or thyromegaly.  Cardiovascular:     Rate and Rhythm: Normal rate and regular rhythm.     Pulses: Normal pulses.  Heart sounds: Normal heart sounds. No murmur heard. Pulmonary:     Effort: Pulmonary effort is normal. No respiratory distress.     Breath sounds: Normal breath sounds. No wheezing, rhonchi or rales.  Musculoskeletal:     Cervical back: Normal range of motion and neck supple.     Right lower leg: No edema.     Left lower leg: No edema.  Lymphadenopathy:     Cervical: No cervical adenopathy.  Skin:    General: Skin is warm and dry.     Findings: No rash.  Neurological:     Mental Status: She is alert.  Psychiatric:        Mood and Affect: Mood normal.        Behavior: Behavior normal.       Results for orders placed or performed in visit on 04/06/22  VITAMIN D 25 Hydroxy (Vit-D Deficiency, Fractures)  Result Value Ref Range   VITD 33.87 30.00 - 100.00 ng/mL  Uric acid  Result Value Ref Range   Uric Acid, Serum 2.2 (L) 2.4 - 7.0 mg/dL  TSH  Result Value Ref Range   TSH 1.83 0.35 - 5.50 uIU/mL  Sedimentation rate  Result Value Ref Range   Sed Rate 5 0 - 20 mm/hr  Rheumatoid factor  Result Value Ref Range   Rheumatoid fact SerPl-aCnc <14 <14 IU/mL  PTH, intact and calcium  Result Value Ref Range   PTH 32 16 - 77 pg/mL   Calcium 9.9 8.6 - 10.2 mg/dL  IBC panel  Result Value Ref Range   Iron 95 42 - 145 ug/dL   Transferrin 829.5 621.3 - 360.0 mg/dL   Saturation Ratios 08.6 20.0 - 50.0 %   TIBC 354.2 250.0 - 450.0 mcg/dL  Ferritin  Result Value Ref Range   Ferritin 34.5 10.0 - 291.0 ng/mL  Cyclic citrul peptide antibody, IgG  Result Value Ref Range    Cyclic Citrullin Peptide Ab <57 UNITS  C-reactive protein  Result Value Ref Range   CRP <1.0 0.5 - 20.0 mg/dL  Comprehensive metabolic panel  Result Value Ref Range   Sodium 136 135 - 145 mEq/L   Potassium 4.0 3.5 - 5.1 mEq/L   Chloride 108 96 - 112 mEq/L   CO2 23 19 - 32 mEq/L   Glucose, Bld 88 70 - 99 mg/dL   BUN 13 6 - 23 mg/dL   Creatinine, Ser 8.46 0.40 - 1.20 mg/dL   Total Bilirubin 0.3 0.2 - 1.2 mg/dL   Alkaline Phosphatase 36 (L) 39 - 117 U/L   AST 17 0 - 37 U/L   ALT 13 0 - 35 U/L   Total Protein 7.5 6.0 - 8.3 g/dL   Albumin 4.8 3.5 - 5.2 g/dL   GFR 96.29 >52.84 mL/min   Calcium 9.7 8.4 - 10.5 mg/dL  CBC with Differential/Platelet  Result Value Ref Range   WBC 5.2 4.0 - 10.5 K/uL   RBC 4.23 3.87 - 5.11 Mil/uL   Hemoglobin 13.6 12.0 - 15.0 g/dL   HCT 13.2 44.0 - 10.2 %   MCV 95.1 78.0 - 100.0 fl   MCHC 33.9 30.0 - 36.0 g/dL   RDW 72.5 36.6 - 44.0 %   Platelets 334.0 150.0 - 400.0 K/uL   Neutrophils Relative % 61.2 43.0 - 77.0 %   Lymphocytes Relative 27.8 12.0 - 46.0 %   Monocytes Relative 8.5 3.0 - 12.0 %   Eosinophils Relative 1.6 0.0 - 5.0 %   Basophils Relative  0.9 0.0 - 3.0 %   Neutro Abs 3.2 1.4 - 7.7 K/uL   Lymphs Abs 1.4 0.7 - 4.0 K/uL   Monocytes Absolute 0.4 0.1 - 1.0 K/uL   Eosinophils Absolute 0.1 0.0 - 0.7 K/uL   Basophils Absolute 0.0 0.0 - 0.1 K/uL  Calcium, ionized  Result Value Ref Range   Calcium, Ion 5.2 4.7 - 5.5 mg/dL  Angiotensin converting enzyme  Result Value Ref Range   Angiotensin-Converting Enzyme 10 9 - 67 U/L  ANA  Result Value Ref Range   Anti Nuclear Antibody (ANA) POSITIVE (A) NEGATIVE  Anti-nuclear ab-titer (ANA titer)  Result Value Ref Range   ANA Titer 1 1:40 (H) titer   ANA Pattern 1 Nuclear, Speckled (A)    No results found for: "CHOL", "HDL", "LDLCALC", "LDLDIRECT", "TRIG", "CHOLHDL"   Assessment & Plan:   Problem List Items Addressed This Visit     Chronic migraine without aura, with intractable migraine, so  stated, with status migrainosus    Chronic migraines since age 49 yo. This is followed by neurologist Dr Lucia Gaskins. She takes botox Q3 months preventatively and nurtec or flexeril abortively. Appreciate neurology care.       Relevant Orders   CBC with Differential/Platelet   TSH   GAD (generalized anxiety disorder) - Primary    Chronic, present for years, deteriorated over the past year, possibly after grandmother's passing. Notes excessive worrying which can be overwhelming. Has implemented healthy stress relieving strategies. She's previously tried zoloft, lexapro, buspar, and hydroxyzine with intolerance or poor effect. Notes trouble sleeping as well (sleep maintenance insomnia).  Will start lorazepam PRN, will refer to counselor.  Discussed pros and cons of controlled substances and expectations to receive prescription from our office. Discussed recommended short term use of med. Discussed risks of medication including dependence, tolerance, and addiction/abuse potential. If ongoing need for lorazepam, will need to sign controlled substance agreement.  Consider SNRI, consider trazodone. Reassess in 2-3 months at CPE.       Relevant Medications   LORazepam (ATIVAN) 0.5 MG tablet   Other Relevant Orders   Ambulatory referral to Psychology   Other Visit Diagnoses     Need for hepatitis C screening test       Relevant Orders   Hepatitis C antibody   Diabetes mellitus screening       Relevant Orders   Comprehensive metabolic panel   Encounter for lipid screening for cardiovascular disease       Relevant Orders   Lipid panel   Comprehensive metabolic panel   Chronic fatigue       Relevant Orders   Vitamin B12   VITAMIN D 25 Hydroxy (Vit-D Deficiency, Fractures)        Meds ordered this encounter  Medications   LORazepam (ATIVAN) 0.5 MG tablet    Sig: Take 1 tablet (0.5 mg total) by mouth 2 (two) times daily as needed for anxiety.    Dispense:  30 tablet    Refill:  0     Orders Placed This Encounter  Procedures   Lipid panel    Standing Status:   Future    Standing Expiration Date:   01/22/2024   Comprehensive metabolic panel    Standing Status:   Future    Standing Expiration Date:   01/22/2024   CBC with Differential/Platelet    Standing Status:   Future    Standing Expiration Date:   01/22/2024   Hepatitis C antibody    Standing Status:  Future    Standing Expiration Date:   01/22/2024   TSH    Standing Status:   Future    Standing Expiration Date:   01/22/2024   Vitamin B12    Standing Status:   Future    Standing Expiration Date:   01/22/2024   VITAMIN D 25 Hydroxy (Vit-D Deficiency, Fractures)    Standing Status:   Future    Standing Expiration Date:   01/22/2024   Ambulatory referral to Psychology    Referral Priority:   Routine    Referral Type:   Psychiatric    Referral Reason:   Specialty Services Required    Requested Specialty:   Psychology    Number of Visits Requested:   1    Patient Instructions  Trial lorazepam as needed for anxiety.  Continue working on healthy stress relieving strategies. We will refer you to counselor. Return as needed or in 2-3 months for physical, prior for labwork.   Follow up plan: Return in about 3 months (around 04/17/2023), or if symptoms worsen or fail to improve, for annual exam, prior fasting for blood work.  Eustaquio Boyden, MD

## 2023-01-21 ENCOUNTER — Encounter: Payer: Self-pay | Admitting: Family Medicine

## 2023-01-21 NOTE — Telephone Encounter (Signed)
Did not see orders in yet.

## 2023-01-22 ENCOUNTER — Telehealth: Payer: Self-pay | Admitting: Neurology

## 2023-01-22 ENCOUNTER — Encounter: Payer: Self-pay | Admitting: Family Medicine

## 2023-01-22 DIAGNOSIS — F411 Generalized anxiety disorder: Secondary | ICD-10-CM | POA: Insufficient documentation

## 2023-01-22 NOTE — Assessment & Plan Note (Addendum)
Chronic, present for years, deteriorated over the past year, possibly after grandmother's passing. Notes excessive worrying which can be overwhelming. Has implemented healthy stress relieving strategies. She's previously tried zoloft, lexapro, buspar, and hydroxyzine with intolerance or poor effect. Notes trouble sleeping as well (sleep maintenance insomnia).  Will start lorazepam PRN, will refer to counselor.  Discussed pros and cons of controlled substances and expectations to receive prescription from our office. Discussed recommended short term use of med. Discussed risks of medication including dependence, tolerance, and addiction/abuse potential. If ongoing need for lorazepam, will need to sign controlled substance agreement.  Consider SNRI, consider trazodone. Reassess in 2-3 months at CPE.

## 2023-01-22 NOTE — Assessment & Plan Note (Signed)
Chronic migraines since age 42 yo. This is followed by neurologist Dr Lucia Gaskins. She takes botox Q3 months preventatively and nurtec or flexeril abortively. Appreciate neurology care.

## 2023-01-22 NOTE — Telephone Encounter (Signed)
Initiated new auth under [employer] for [employer] via [employer]. Berkley Harvey is pending ref #: [id number].

## 2023-01-22 NOTE — Addendum Note (Signed)
Addended by: Eustaquio Boyden on: 01/22/2023 05:47 PM   Modules accepted: Orders

## 2023-01-24 NOTE — Telephone Encounter (Signed)
Received approval via [employer] portal, ref # [id number] (01/22/23-07/21/23).

## 2023-01-28 ENCOUNTER — Ambulatory Visit (INDEPENDENT_AMBULATORY_CARE_PROVIDER_SITE_OTHER): Payer: 59 | Admitting: Family Medicine

## 2023-01-28 ENCOUNTER — Encounter: Payer: Self-pay | Admitting: Family Medicine

## 2023-01-28 VITALS — BP 90/70 | HR 59 | Temp 98.0°F | Ht 64.25 in | Wt 142.4 lb

## 2023-01-28 DIAGNOSIS — B9789 Other viral agents as the cause of diseases classified elsewhere: Secondary | ICD-10-CM

## 2023-01-28 DIAGNOSIS — J040 Acute laryngitis: Secondary | ICD-10-CM

## 2023-01-28 NOTE — Progress Notes (Signed)
    Mattia Liford T. Hakop Humbarger, MD, CAQ Sports Medicine [facility] at [facility] [address] [place], [address]  Phone: [phone]  FAX: [phone]  Michelle Rhodes - 42 y.o. female  MRN [id number]  Date of Birth: 1981/07/16  Date: 01/28/2023  PCP: Eustaquio Boyden, MD  Referral: Eustaquio Boyden, MD  Chief Complaint  Patient presents with   Laryngitis    Started 3 days ago Negative Covid Test on Saturday and Sunday x3   Subjective:   Michelle Rhodes is a 42 y.o. very pleasant female patient with Body mass index is 24.25 kg/m. who presents with the following:  Laryngitis.  For 3 days she has had some isolated laryngitis.  She has had some occasional discomfort in the throat, but this is not frankly sore.  She denies any headache, sinus pressure, sinus congestion, chest congestion, cough, nausea, vomiting, diarrhea, or other acute symptoms at all.  She drank some hot tea this morning, this does seem to help.  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     Review of Systems is noted in the HPI, as appropriate  Objective:   BP 90/70 (BP Location: Left Arm, Patient Position: Sitting, Cuff Size: Normal)   Pulse (!) 59   Temp 98 F (36.7 C) (Temporal)   Ht 5' 4.25" (1.632 m)   Wt 142 lb 6 oz (64.6 kg)   SpO2 99%   BMI 24.25 kg/m    Gen: WDWN, NAD. Globally Non-toxic HEENT: Throat clear, w/o exudate, R TM clear, L TM - good landmarks, No fluid present. No rhinnorhea.  MMM Frontal sinuses: NT Max sinuses: NT NECK: Anterior cervical  LAD is absent CV: RRR, No M/G/R, cap refill <2 sec PULM: Breathing comfortably in no respiratory distress. no wheezing, crackles, rhonchi   Laboratory and Imaging Data:  Assessment and Plan:     ICD-10-CM   1. Viral laryngitis  J04.0    B97.89      Classic in presentation without any other associated symptoms.   This will resolve on its own.  She will return to work, reassurance.  Disposition: No follow-ups on file.  [employer] speech-to-text software was used for transcription in this dictation.  Possible transcriptional errors can occur using Animal nutritionist.   Signed,  Elpidio Galea. Gust Eugene, MD   Outpatient Encounter Medications as of 01/28/2023  Medication Sig   botulinum toxin Type A (BOTOX) 100 units SOLR injection PROVIDER TO INJECT 155 UNITS INTRAMUSCULARLY INTO HEAD AND NECK EVERY 3 MONTHS. DISCARD REMAINDER.   cyclobenzaprine (FLEXERIL) 10 MG tablet Take 1 tablet (10 mg total) by mouth at bedtime.   LORazepam (ATIVAN) 0.5 MG tablet Take 1 tablet (0.5 mg total) by mouth 2 (two) times daily as needed for anxiety.   Rimegepant Sulfate (NURTEC) 75 MG TBDP Take 1 tablet (75 mg total) by mouth daily as needed (migraine).   Facility-Administered Encounter Medications as of 01/28/2023  Medication   botulinum toxin Type A (BOTOX) injection 155 Units

## 2023-01-29 ENCOUNTER — Other Ambulatory Visit (HOSPITAL_BASED_OUTPATIENT_CLINIC_OR_DEPARTMENT_OTHER): Payer: Self-pay

## 2023-01-29 ENCOUNTER — Telehealth: Payer: Self-pay

## 2023-01-29 ENCOUNTER — Other Ambulatory Visit: Payer: Self-pay

## 2023-01-29 DIAGNOSIS — B9789 Other viral agents as the cause of diseases classified elsewhere: Secondary | ICD-10-CM

## 2023-01-29 MED ORDER — PREDNISONE 10 MG PO TABS
ORAL_TABLET | ORAL | 0 refills | Status: DC
Start: 2023-01-29 — End: 2023-01-29

## 2023-01-29 MED ORDER — PREDNISONE 10 MG PO TABS
ORAL_TABLET | ORAL | 0 refills | Status: DC
Start: 2023-01-29 — End: 2023-04-19
  Filled 2023-01-29: qty 18, 9d supply, fill #0

## 2023-01-29 NOTE — Telephone Encounter (Signed)
Pt seen her PCP yesterday . She is not feeling better today COVID-19 test was negative . Throat is not feeling any better , hoarseness. Can we send in Prednisone ?

## 2023-01-29 NOTE — Telephone Encounter (Signed)
Sent in Rx to [facility] 

## 2023-01-30 ENCOUNTER — Other Ambulatory Visit (INDEPENDENT_AMBULATORY_CARE_PROVIDER_SITE_OTHER): Payer: 59

## 2023-01-30 ENCOUNTER — Encounter: Payer: Self-pay | Admitting: Neurology

## 2023-01-30 DIAGNOSIS — Z136 Encounter for screening for cardiovascular disorders: Secondary | ICD-10-CM

## 2023-01-30 DIAGNOSIS — Z13220 Encounter for screening for lipoid disorders: Secondary | ICD-10-CM | POA: Diagnosis not present

## 2023-01-30 DIAGNOSIS — R5382 Chronic fatigue, unspecified: Secondary | ICD-10-CM

## 2023-01-30 DIAGNOSIS — G43711 Chronic migraine without aura, intractable, with status migrainosus: Secondary | ICD-10-CM

## 2023-01-30 DIAGNOSIS — Z131 Encounter for screening for diabetes mellitus: Secondary | ICD-10-CM | POA: Diagnosis not present

## 2023-01-30 DIAGNOSIS — Z1159 Encounter for screening for other viral diseases: Secondary | ICD-10-CM

## 2023-01-30 LAB — LIPID PANEL
Cholesterol: 191 mg/dL (ref 0–200)
HDL: 73.6 mg/dL (ref >39.00)
LDL Cholesterol: 109 mg/dL — ABNORMAL HIGH (ref 0–99)
NonHDL: 117.71
Total CHOL/HDL Ratio: 3
Triglycerides: 45 mg/dL (ref 0.0–149.0)
VLDL: 9 mg/dL (ref 0.0–40.0)

## 2023-01-30 LAB — CBC WITH DIFFERENTIAL/PLATELET
Basophils Absolute: 0 10*3/uL (ref 0.0–0.1)
Basophils Relative: 0.3 % (ref 0.0–3.0)
Eosinophils Absolute: 0 10*3/uL (ref 0.0–0.7)
Eosinophils Relative: 0.3 % (ref 0.0–5.0)
HCT: 41.3 % (ref 36.0–46.0)
Hemoglobin: 13.9 g/dL (ref 12.0–15.0)
Lymphocytes Relative: 14.1 % (ref 12.0–46.0)
Lymphs Abs: 1.2 10*3/uL (ref 0.7–4.0)
MCHC: 33.8 g/dL (ref 30.0–36.0)
MCV: 97 fl (ref 78.0–100.0)
Monocytes Absolute: 0.7 10*3/uL (ref 0.1–1.0)
Monocytes Relative: 7.6 % (ref 3.0–12.0)
Neutro Abs: 6.8 10*3/uL (ref 1.4–7.7)
Neutrophils Relative %: 77.7 % — ABNORMAL HIGH (ref 43.0–77.0)
Platelets: 390 10*3/uL (ref 150.0–400.0)
RBC: 4.26 Mil/uL (ref 3.87–5.11)
RDW: 13.5 % (ref 11.5–15.5)
WBC: 8.7 10*3/uL (ref 4.0–10.5)

## 2023-01-30 LAB — COMPREHENSIVE METABOLIC PANEL
ALT: 12 U/L (ref 0–35)
AST: 15 U/L (ref 0–37)
Albumin: 4.7 g/dL (ref 3.5–5.2)
Alkaline Phosphatase: 46 U/L (ref 39–117)
BUN: 12 mg/dL (ref 6–23)
CO2: 24 mEq/L (ref 19–32)
Calcium: 9.8 mg/dL (ref 8.4–10.5)
Chloride: 100 mEq/L (ref 96–112)
Creatinine, Ser: 0.67 mg/dL (ref 0.40–1.20)
GFR: 109.23 mL/min (ref >60.00)
Glucose, Bld: 85 mg/dL (ref 70–99)
Potassium: 3.9 mEq/L (ref 3.5–5.1)
Sodium: 136 mEq/L (ref 135–145)
Total Bilirubin: 0.4 mg/dL (ref 0.2–1.2)
Total Protein: 7.6 g/dL (ref 6.0–8.3)

## 2023-01-30 LAB — VITAMIN B12: Vitamin B-12: 444 pg/mL (ref 211–911)

## 2023-01-30 LAB — VITAMIN D 25 HYDROXY (VIT D DEFICIENCY, FRACTURES): VITD: 44.55 ng/mL (ref 30.00–100.00)

## 2023-01-30 LAB — TSH: TSH: 1.56 u[IU]/mL (ref 0.35–5.50)

## 2023-01-31 LAB — HEPATITIS C ANTIBODY: Hepatitis C Ab: NONREACTIVE

## 2023-02-01 ENCOUNTER — Other Ambulatory Visit: Payer: Self-pay | Admitting: Family Medicine

## 2023-02-01 ENCOUNTER — Other Ambulatory Visit (HOSPITAL_BASED_OUTPATIENT_CLINIC_OR_DEPARTMENT_OTHER): Payer: Self-pay

## 2023-02-01 ENCOUNTER — Telehealth: Payer: Self-pay

## 2023-02-01 MED ORDER — PROMETHAZINE-DM 6.25-15 MG/5ML PO SYRP
5.0000 mL | ORAL_SOLUTION | Freq: Four times a day (QID) | ORAL | 0 refills | Status: DC | PRN
  Filled 2023-02-01: qty 118, 6d supply, fill #0

## 2023-02-01 MED ORDER — AZITHROMYCIN 250 MG PO TABS
ORAL_TABLET | ORAL | 0 refills | Status: AC
  Filled 2023-02-01: qty 6, 5d supply, fill #0

## 2023-02-01 NOTE — Telephone Encounter (Signed)
Pt states she is having a hard time sleeping due to coughing . She is not any better per pt can we please call pt in something ?

## 2023-02-01 NOTE — Progress Notes (Signed)
Patient has called reporting she is unable to sleep at night due to coughing and feels chills/sweating. Patient has been sick for about a week.

## 2023-02-01 NOTE — Telephone Encounter (Signed)
Pt is aware.  

## 2023-02-05 ENCOUNTER — Other Ambulatory Visit: Payer: Self-pay | Admitting: Family Medicine

## 2023-02-05 ENCOUNTER — Other Ambulatory Visit (HOSPITAL_BASED_OUTPATIENT_CLINIC_OR_DEPARTMENT_OTHER): Payer: Self-pay

## 2023-02-05 ENCOUNTER — Telehealth: Payer: Self-pay

## 2023-02-05 MED ORDER — CEPHALEXIN 500 MG PO CAPS
500.0000 mg | ORAL_CAPSULE | Freq: Two times a day (BID) | ORAL | 0 refills | Status: AC
  Filled 2023-02-05: qty 20, 10d supply, fill #0

## 2023-02-05 NOTE — Progress Notes (Unsigned)
[name] [address] [place] [address] Phone: [phone] Subjective:   Michelle Rhodes, am serving as a scribe for Dr. Antoine Primas.  I'm seeing this patient by the request  of:  Eustaquio Boyden, MD  CC: Back and neck pain follow-up  [id number]  Michelle Rhodes is a 42 y.o. female coming in with complaint of back and neck pain. OMT On 12/27/2022. Patient states continue to have tightness on the left side of the neck.  Describes it as a dull, throbbing aching sensation.  Medications patient has been prescribed:   Taking:         Reviewed prior external information including notes and imaging from previsou exam, outside providers and external EMR if available.   As well as notes that were available from care everywhere and other healthcare systems.  Past medical history, social, surgical and family history all reviewed in electronic medical record.  No pertanent information unless stated regarding to the chief complaint.   Past Medical History:  Diagnosis Date   Anxiety    Asthma    as a child   Migraine     No Known Allergies   Review of Systems:  No  visual changes, nausea, vomiting, diarrhea, constipation, dizziness, abdominal pain, skin rash, fevers, chills, night sweats, weight loss, swollen lymph nodes, body aches, joint swelling, chest pain, shortness of breath, mood changes. POSITIVE muscle aches, headache  Objective  Blood pressure 106/68, pulse 70, height 5\' 4"  (1.626 m), weight 146 lb (66.2 kg), SpO2 98 %.   General: No apparent distress alert and oriented x3 mood and affect normal, dressed appropriately.  HEENT: Pupils equal, extraocular movements intact  Respiratory: Patient's speak in full sentences and does not appear short of breath  Cardiovascular: No lower extremity edema, non tender, no erythema  Back exam does have some loss of lordosis noted.  Patient is Does have significant tightness of the neck  bilaterally.  Significant tightness of the right and left trapezius left greater than right.  Patient does have a trigger points noted in the levator scapula, trapezius rhomboid.  Osteopathic findings  C2 flexed rotated and side bent left C6 flexed rotated and side bent left T3 extended rotated and side bent left inhaled rib L5 flexed rotated and side bent left Sacrum right on right   After verbal consent patient was prepped with alcohol swab and with a 25-gauge half inch needle injected with 3cc of 0.5% Marcaine 1 cc of Kenalog 40 mg/mL.  These were in 3 distinct trigger points in the trapezius, rhomboid and latissimus dorsi of the left shoulder region.  No blood loss.  Band-Aid placed.  Postinjection instructions given   Assessment and Plan:  Trigger point of shoulder region Continues to have chronic problems.  Could be more posteriorly than the lungs.  Discussed continuing advanced imaging that was having some in the past we will continue to work on. Dialysis.  Found previously.  Increasing activities otherwise.  Follow-up again in 6 to 8 weeks.  Cervicogenic headache Continues to have some cervicogenic headaches.  Has responded well to osteopathic manipulation previously and hopefully will continue.  Discussed posture and balance otherwise.  Follow-up again in 6 to 8 weeks     Nonallopathic problems  Decision today to treat with OMT was based on Physical Exam  After verbal consent patient was treated with HVLA, ME, FPR techniques in cervical, rib, thoracic, lumbar, and sacral  areas  Patient tolerated the procedure  well with improvement in symptoms  Patient given exercises, stretches and lifestyle modifications  See medications in patient instructions if given  Patient will follow up in 4-8 weeks             Note: This dictation was prepared with Dragon dictation along with smaller phrase technology. Any transcriptional errors that result from this process are  unintentional.

## 2023-02-05 NOTE — Telephone Encounter (Signed)
Pt is aware of medication sent in and referral

## 2023-02-05 NOTE — Progress Notes (Signed)
Sending Keflex 500mg  BID for 10 days for sinus infection. Azithromycin was not effective and still having symptoms. If not improved, will recommend an ENT referral.

## 2023-02-05 NOTE — Telephone Encounter (Signed)
Pt is asking if we can call in her some Kelfex ? Pt is still not feeling well

## 2023-02-07 ENCOUNTER — Encounter: Payer: Self-pay | Admitting: Family Medicine

## 2023-02-07 ENCOUNTER — Ambulatory Visit (INDEPENDENT_AMBULATORY_CARE_PROVIDER_SITE_OTHER): Payer: 59 | Admitting: Family Medicine

## 2023-02-07 VITALS — BP 106/68 | HR 70 | Ht 64.0 in | Wt 146.0 lb

## 2023-02-07 DIAGNOSIS — G4486 Cervicogenic headache: Secondary | ICD-10-CM | POA: Diagnosis not present

## 2023-02-07 DIAGNOSIS — M9902 Segmental and somatic dysfunction of thoracic region: Secondary | ICD-10-CM

## 2023-02-07 DIAGNOSIS — M9901 Segmental and somatic dysfunction of cervical region: Secondary | ICD-10-CM

## 2023-02-07 DIAGNOSIS — M9908 Segmental and somatic dysfunction of rib cage: Secondary | ICD-10-CM

## 2023-02-07 DIAGNOSIS — M9903 Segmental and somatic dysfunction of lumbar region: Secondary | ICD-10-CM

## 2023-02-07 DIAGNOSIS — M9904 Segmental and somatic dysfunction of sacral region: Secondary | ICD-10-CM | POA: Diagnosis not present

## 2023-02-07 DIAGNOSIS — M25519 Pain in unspecified shoulder: Secondary | ICD-10-CM

## 2023-02-07 NOTE — Assessment & Plan Note (Signed)
Continues to have some cervicogenic headaches.  Has responded well to osteopathic manipulation previously and hopefully will continue.  Discussed posture and balance otherwise.  Follow-up again in 6 to 8 weeks

## 2023-02-07 NOTE — Patient Instructions (Signed)
Good to see you! Trigger point injections today See you again in 7-8 weeks

## 2023-02-07 NOTE — Assessment & Plan Note (Signed)
Continues to have chronic problems.  Could be more posteriorly than the lungs.  Discussed continuing advanced imaging that was having some in the past we will continue to work on. Dialysis.  Found previously.  Increasing activities otherwise.  Follow-up again in 6 to 8 weeks.

## 2023-02-19 ENCOUNTER — Ambulatory Visit (INDEPENDENT_AMBULATORY_CARE_PROVIDER_SITE_OTHER): Payer: 59 | Admitting: Neurology

## 2023-02-19 ENCOUNTER — Other Ambulatory Visit: Payer: Self-pay | Admitting: Oncology

## 2023-02-19 DIAGNOSIS — Z006 Encounter for examination for normal comparison and control in clinical research program: Secondary | ICD-10-CM

## 2023-02-19 DIAGNOSIS — G43711 Chronic migraine without aura, intractable, with status migrainosus: Secondary | ICD-10-CM

## 2023-02-19 DIAGNOSIS — R9082 White matter disease, unspecified: Secondary | ICD-10-CM

## 2023-02-19 DIAGNOSIS — H539 Unspecified visual disturbance: Secondary | ICD-10-CM

## 2023-02-19 DIAGNOSIS — G379 Demyelinating disease of central nervous system, unspecified: Secondary | ICD-10-CM

## 2023-02-19 NOTE — Progress Notes (Signed)
Consent Form 02/24/2023: Have recommended MRI of the brain in the past. She would like to go forward with it due to concerning symptoms including: MRI brain due to concerning symptoms of abnormal white matter changes to follow for progression and blurry vision - to look for vasculidities, demyelination(multiple sclerosis) or other brain - MS protocol. stable with migraines still doing very well. She has > 50% improvement in migraine frequency and severity  Orders Placed This Encounter  Procedures   MR BRAIN W WO CONTRAST     Botulism Toxin Injection For Chronic Migraine 11/20/2022: Stable 08/23/2022: Stable still doing very well. She has > 50% improvement in migraine frequency and severity. Now with botox she has 4 migraine days a month and < 8 total headache days a month.   05/23/2022: Stable still doing very well. She has > 50% improvement in migraine frequency and severity. Now with botox she has 4 migraine days a month and < 8 total headache days a month.     719/2023: going great, stable, >> 50% decrease in freq and severity of migraines and headaches.   4/5/2023[name] 08/23/2021: doing great as far migraines go but she continues to have cervical myofascial pain, musculoskletal neck pain, has tried dry needling, PT, muscle relaxers. Refer to specialist.. MRI cervical spine 2018 was normal. Personally reviewed images with patient (additional 10 minutes on personal review on CD in our office if no improvement recommend repeat MRI cervical spine worsening pain, decreased ROM, crepitus) MRI c-spine 2018: :  Metal artifact slightly obscures the upper anterior spine. On sagittal images, the spine is imaged from above the cervicomedullary junction to T2.   The spinal cord is of normal caliber and signal.   There is some straightening of the cervical curvature. No spondylolisthesis is noted..   The vertebral bodies have normal signal.  The discs and interspaces were further evaluated on  axial views from C2 to T1.   The discs were normal at each of the cervical levels. There was no spondylosis. The neural foramina are widely patent at every level and there is no nerve root compression.After the infusion of contrast, a normal enhancement pattern was observed IMPRESSION:  This is a normal MRI of the cervical spine with and without contrast.  Orders Placed This Encounter  Procedures   MR BRAIN W WO CONTRAST   I spent 20 minutes of face-to-face and non-face-to-face time with patient on the  1. White matter abnormality on MRI of brain   2. Demyelinating changes in brain (HCC)   3. Vision changes      diagnosis.  This included previsit chart review, lab review, study review, order entry, electronic health record documentation, patient education on the different diagnostic and therapeutic options, counseling and coordination of care, risks and benefits of management, compliance, or risk factor reduction. This does not include time spent on botox.    05/27/2021: Stable, still excellent response, > 80% improvement with freg of migraines   Reviewed orally with patient, additionally signature is on file:  Botulism toxin has been approved by the Federal drug administration for treatment of chronic migraine. Botulism toxin does not cure chronic migraine and it may not be effective in some patients.  The administration of botulism toxin is accomplished by injecting a small amount of toxin into the muscles of the neck and head. Dosage must be titrated for each individual. Any benefits resulting from botulism toxin tend to wear off after 3 months with a repeat injection  required if benefit is to be maintained. Injections are usually done every 3-4 months with maximum effect peak achieved by about 2 or 3 weeks. Botulism toxin is expensive and you should be sure of what costs you will incur resulting from the injection.  The side effects of botulism toxin use for chronic migraine may  include:   -Transient, and usually mild, facial weakness with facial injections  -Transient, and usually mild, head or neck weakness with head/neck injections  -Reduction or loss of forehead facial animation due to forehead muscle weakness  -Eyelid drooping  -Dry eye  -Pain at the site of injection or bruising at the site of injection  -Double vision  -Potential unknown long term risks  Contraindications: You should not have Botox if you are pregnant, nursing, allergic to albumin, have an infection, skin condition, or muscle weakness at the site of the injection, or have myasthenia gravis, Lambert-Eaton syndrome, or ALS.  It is also possible that as with any injection, there may be an allergic reaction or no effect from the medication. Reduced effectiveness after repeated injections is sometimes seen and rarely infection at the injection site may occur. All care will be taken to prevent these side effects. If therapy is given over a long time, atrophy and wasting in the muscle injected may occur. Occasionally the patient's become refractory to treatment because they develop antibodies to the toxin. In this event, therapy needs to be modified.  I have read the above information and consent to the administration of botulism toxin.    BOTOX PROCEDURE NOTE FOR MIGRAINE HEADACHE    Contraindications and precautions discussed with patient(above). Aseptic procedure was observed and patient tolerated procedure. Procedure performed by Dr. Artemio Aly  The condition has existed for more than 6 months, and pt does not have a diagnosis of ALS, Myasthenia Gravis or Lambert-Eaton Syndrome.  Risks and benefits of injections discussed and pt agrees to proceed with the procedure.  Written consent obtained  These injections are medically necessary. Pt  receives good benefits from these injections. These injections do not cause sedations or hallucinations which the oral therapies may cause.  Description of  procedure:  The patient was placed in a sitting position. The standard protocol was used for Botox as follows, with 5 units of Botox injected at each site:   -Procerus muscle, midline injection  -Corrugator muscle, bilateral injection  -Frontalis muscle, bilateral injection, with 2 sites each side, medial injection was performed in the upper one third of the frontalis muscle, in the region vertical from the medial inferior edge of the superior orbital rim. The lateral injection was again in the upper one third of the forehead vertically above the lateral limbus of the cornea, 1.5 cm lateral to the medial injection site.  - Levator Scapulae: 5 units bilaterally  -Temporalis muscle injection, 5 sites, bilaterally. The first injection was 3 cm above the tragus of the ear, second injection site was 1.5 cm to 3 cm up from the first injection site in line with the tragus of the ear. The third injection site was 1.5-3 cm forward between the first 2 injection sites. The fourth injection site was 1.5 cm posterior to the second injection site. 5th site laterally in the temporalis  muscleat the level of the outer canthus.  - Patient feels her clenching is a trigger for headaches. +5 units masseter bilaterally   - Patient feels the migraines are centered around the eyes +5 units bilaterally at the outer canthus in  the orbicularis occuli  -Occipitalis muscle injection, 3 sites, bilaterally. The first injection was done one half way between the occipital protuberance and the tip of the mastoid process behind the ear. The second injection site was done lateral and superior to the first, 1 fingerbreadth from the first injection. The third injection site was 1 fingerbreadth superiorly and medially from the first injection site.  -Cervical paraspinal muscle injection, 2 sites, bilateral knee first injection site was 1 cm from the midline of the cervical spine, 3 cm inferior to the lower border of the occipital  protuberance. The second injection site was 1.5 cm superiorly and laterally to the first injection site.  -Trapezius muscle injection was performed at 3 sites, bilaterally. The first injection site was in the upper trapezius muscle halfway between the inflection point of the neck, and the acromion. The second injection site was one half way between the acromion and the first injection site. The third injection was done between the first injection site and the inflection point of the neck.   Will return for repeat injection in 3 months.   155 unit sof Botox was used, 45U Botox not injected was wasted. The patient tolerated the procedure well, there were no complications of the above procedure.

## 2023-02-19 NOTE — Progress Notes (Signed)
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

## 2023-02-25 ENCOUNTER — Telehealth: Payer: Self-pay | Admitting: Neurology

## 2023-02-25 DIAGNOSIS — G43711 Chronic migraine without aura, intractable, with status migrainosus: Secondary | ICD-10-CM | POA: Diagnosis not present

## 2023-02-25 MED ORDER — ONABOTULINUMTOXINA 200 UNITS IJ SOLR
155.0000 [IU] | Freq: Once | INTRAMUSCULAR | Status: AC
Start: 2023-02-25 — End: 2023-02-25
  Administered 2023-02-25: 155 [IU] via INTRAMUSCULAR

## 2023-02-25 NOTE — Telephone Encounter (Signed)
[facility] and [facility] medicaid NPR sent to GI [id number]

## 2023-03-21 ENCOUNTER — Ambulatory Visit: Payer: 59 | Admitting: Clinical

## 2023-03-27 NOTE — Progress Notes (Unsigned)
[name] [address] [place] [address] Phone: [phone] Subjective:   Bruce Donath, am serving as a scribe for Dr. Antoine Primas.  I'm seeing this patient by the request  of:  Eustaquio Boyden, MD  CC: Low back and neck pain follow-up  [id number]  Michelle Rhodes is a 42 y.o. female coming in with complaint of back and neck pain. OMT On 02/07/2023. Patient states that she has had more migraines the past 2 weeks. Neck pain has increased. Injections do give her relief for a few weeks. Weather increases her pain as well.   Medications patient has been prescribed:   Taking:         Reviewed prior external information including notes and imaging from previsou exam, outside providers and external EMR if available.   As well as notes that were available from care everywhere and other healthcare systems.  Past medical history, social, surgical and family history all reviewed in electronic medical record.  No pertanent information unless stated regarding to the chief complaint.   Past Medical History:  Diagnosis Date   Anxiety    Asthma    as a child   Migraine     No Known Allergies   Review of Systems:  No visual changes, nausea, vomiting, diarrhea, constipation, dizziness, abdominal pain, skin rash, fevers, chills, night sweats, weight loss, swollen lymph nodes, body aches, joint swelling, chest pain, shortne ss of breath, mood changes. POSITIVE muscle aches, headache  Objective  Blood pressure 110/84, height 5\' 4"  (1.626 m), weight 143 lb (64.9 kg).   General: No apparent distress alert and oriented x3 mood and affect normal, dressed appropriately.  HEENT: Pupils equal, extraocular movements intact  Respiratory: Patient's speak in full sentences and does not appear short of breath  Cardiovascular: No lower extremity edema, non tender, no erythema  Negative does have some tightness noted.  Osteopathic findings  C2  flexed rotated and side bent right C7 flexed rotated and side bent left T3 extended rotated and side bent right inhaled rib T6 extended rotated and side bent left L3 flexed rotated and side bent right Sacrum right on right     Assessment and Plan:  Cervicogenic headache Cervicogenic headaches noted.  Discussed with patient and seems to be worsening.  Did have some elevation in blood pressure today as well.  Patient is scheduled for an MRI of the brain.  Has had some white matter abnormalities of the brain previously.  Patient has had chronic migraines as well.  Neck exam does show some increasing in tightness as well that we will monitor.  Patient is having facial flushing so we will get some laboratory workup to further evaluate if anything else is potentially contributing.  Has had a positive ANA previously but we have not followed up with it with patient having difficulty as well weight and she did lose her mom.  We will recheck labs to see if anything is changing drastically or not causing some of these other symptoms.  Follow-up with me again in 6 to 8 weeks otherwise.    Nonallopathic problems  Decision today to treat with OMT was based on Physical Exam  After verbal consent patient was treated with HVLA, ME, FPR techniques in cervical, rib, thoracic, lumbar, and sacral  areas  Patient tolerated the procedure well with improvement in symptoms  Patient given exercises, stretches and lifestyle modifications  See medications in patient instructions if given  Patient will follow  up in 4-8 weeks      The above documentation has been reviewed and is accurate and complete Judi Saa, DO        Note: This dictation was prepared with Dragon dictation along with smaller phrase technology. Any transcriptional errors that result from this process are unintentional.

## 2023-03-28 ENCOUNTER — Encounter: Payer: Self-pay | Admitting: Family Medicine

## 2023-03-28 ENCOUNTER — Other Ambulatory Visit: Payer: 59

## 2023-03-28 ENCOUNTER — Ambulatory Visit (INDEPENDENT_AMBULATORY_CARE_PROVIDER_SITE_OTHER): Payer: 59 | Admitting: Family Medicine

## 2023-03-28 ENCOUNTER — Ambulatory Visit
Admission: RE | Admit: 2023-03-28 | Discharge: 2023-03-28 | Disposition: A | Discharge status: Home / Self Care | Payer: 59 | Source: Ambulatory Visit | Attending: Neurology | Admitting: Neurology

## 2023-03-28 VITALS — BP 110/84 | Ht 64.0 in | Wt 143.0 lb

## 2023-03-28 DIAGNOSIS — H539 Unspecified visual disturbance: Secondary | ICD-10-CM | POA: Diagnosis not present

## 2023-03-28 DIAGNOSIS — M9902 Segmental and somatic dysfunction of thoracic region: Secondary | ICD-10-CM

## 2023-03-28 DIAGNOSIS — G4486 Cervicogenic headache: Secondary | ICD-10-CM

## 2023-03-28 DIAGNOSIS — M2550 Pain in unspecified joint: Secondary | ICD-10-CM | POA: Diagnosis not present

## 2023-03-28 DIAGNOSIS — R9082 White matter disease, unspecified: Secondary | ICD-10-CM | POA: Diagnosis not present

## 2023-03-28 DIAGNOSIS — M9908 Segmental and somatic dysfunction of rib cage: Secondary | ICD-10-CM

## 2023-03-28 DIAGNOSIS — M9903 Segmental and somatic dysfunction of lumbar region: Secondary | ICD-10-CM | POA: Diagnosis not present

## 2023-03-28 DIAGNOSIS — M9904 Segmental and somatic dysfunction of sacral region: Secondary | ICD-10-CM

## 2023-03-28 DIAGNOSIS — M9901 Segmental and somatic dysfunction of cervical region: Secondary | ICD-10-CM

## 2023-03-28 DIAGNOSIS — G379 Demyelinating disease of central nervous system, unspecified: Secondary | ICD-10-CM

## 2023-03-28 LAB — COMPREHENSIVE METABOLIC PANEL
ALT: 11 U/L (ref 0–35)
AST: 16 U/L (ref 0–37)
Albumin: 4.8 g/dL (ref 3.5–5.2)
Alkaline Phosphatase: 34 U/L — ABNORMAL LOW (ref 39–117)
BUN: 11 mg/dL (ref 6–23)
CO2: 25 mEq/L (ref 19–32)
Calcium: 9.6 mg/dL (ref 8.4–10.5)
Chloride: 99 mEq/L (ref 96–112)
Creatinine, Ser: 0.64 mg/dL (ref 0.40–1.20)
GFR: 110.32 mL/min (ref >60.00)
Glucose, Bld: 88 mg/dL (ref 70–99)
Potassium: 3.7 mEq/L (ref 3.5–5.1)
Sodium: 132 mEq/L — ABNORMAL LOW (ref 135–145)
Total Bilirubin: 0.5 mg/dL (ref 0.2–1.2)
Total Protein: 7.3 g/dL (ref 6.0–8.3)

## 2023-03-28 LAB — IBC PANEL
Iron: 109 ug/dL (ref 42–145)
Saturation Ratios: 29.5 % (ref 20.0–50.0)
TIBC: 369.6 ug/dL (ref 250.0–450.0)
Transferrin: 264 mg/dL (ref 212.0–360.0)

## 2023-03-28 LAB — FERRITIN: Ferritin: 46 ng/mL (ref 10.0–291.0)

## 2023-03-28 LAB — TSH: TSH: 1.54 u[IU]/mL (ref 0.35–5.50)

## 2023-03-28 LAB — CBC WITH DIFFERENTIAL/PLATELET
Basophils Absolute: 0 10*3/uL (ref 0.0–0.1)
Basophils Relative: 0.9 % (ref 0.0–3.0)
Eosinophils Absolute: 0 10*3/uL (ref 0.0–0.7)
Eosinophils Relative: 0.7 % (ref 0.0–5.0)
HCT: 39.2 % (ref 36.0–46.0)
Hemoglobin: 13.4 g/dL (ref 12.0–15.0)
Lymphocytes Relative: 28.6 % (ref 12.0–46.0)
Lymphs Abs: 1.4 10*3/uL (ref 0.7–4.0)
MCHC: 34.2 g/dL (ref 30.0–36.0)
MCV: 97.3 fl (ref 78.0–100.0)
Monocytes Absolute: 0.4 10*3/uL (ref 0.1–1.0)
Monocytes Relative: 8 % (ref 3.0–12.0)
Neutro Abs: 3 10*3/uL (ref 1.4–7.7)
Neutrophils Relative %: 61.8 % (ref 43.0–77.0)
Platelets: 366 10*3/uL (ref 150.0–400.0)
RBC: 4.03 Mil/uL (ref 3.87–5.11)
RDW: 12.8 % (ref 11.5–15.5)
WBC: 4.9 10*3/uL (ref 4.0–10.5)

## 2023-03-28 LAB — C-REACTIVE PROTEIN: CRP: <1 mg/dL (ref 0.5–20.0)

## 2023-03-28 LAB — URIC ACID: Uric Acid, Serum: 2.4 mg/dL (ref 2.4–7.0)

## 2023-03-28 LAB — VITAMIN D 25 HYDROXY (VIT D DEFICIENCY, FRACTURES): VITD: 44.17 ng/mL (ref 30.00–100.00)

## 2023-03-28 LAB — SEDIMENTATION RATE: Sed Rate: 3 mm/hr (ref 0–20)

## 2023-03-28 LAB — VITAMIN B12: Vitamin B-12: 673 pg/mL (ref 211–911)

## 2023-03-28 MED ORDER — GADOPICLENOL 0.5 MMOL/ML IV SOLN
6.0000 mL | Freq: Once | INTRAVENOUS | Status: AC | PRN
  Administered 2023-03-28: 6 mL via INTRAVENOUS

## 2023-03-28 NOTE — Patient Instructions (Addendum)
Labs today See me in 6-8 weeks

## 2023-03-28 NOTE — Assessment & Plan Note (Signed)
Cervicogenic headaches noted.  Discussed with patient and seems to be worsening.  Did have some elevation in blood pressure today as well.  Patient is scheduled for an MRI of the brain.  Has had some white matter abnormalities of the brain previously.  Patient has had chronic migraines as well.  Neck exam does show some increasing in tightness as well that we will monitor.  Patient is having facial flushing so we will get some laboratory workup to further evaluate if anything else is potentially contributing.  Has had a positive ANA previously but we have not followed up with it with patient having difficulty as well weight and she did lose her mom.  We will recheck labs to see if anything is changing drastically or not causing some of these other symptoms.  Follow-up with me again in 6 to 8 weeks otherwise.

## 2023-03-31 LAB — CENTROMERE ANTIBODIES: Centromere Ab Screen: <1 AI

## 2023-03-31 LAB — ANGIOTENSIN CONVERTING ENZYME: Angiotensin-Converting Enzyme: 21 U/L (ref 9–67)

## 2023-03-31 LAB — CALCIUM, IONIZED: Calcium, Ion: 5.2 mg/dL (ref 4.7–5.5)

## 2023-03-31 LAB — ANA: Anti Nuclear Antibody (ANA): POSITIVE — AB

## 2023-03-31 LAB — ANTI-NUCLEAR AB-TITER (ANA TITER): ANA Titer 1: 1:40 {titer} — ABNORMAL HIGH

## 2023-03-31 LAB — PTH, INTACT AND CALCIUM
Calcium: 9.9 mg/dL (ref 8.6–10.2)
PTH: 51 pg/mL (ref 16–77)

## 2023-03-31 LAB — RHEUMATOID FACTOR: Rheumatoid fact SerPl-aCnc: <10 [IU]/mL (ref <14)

## 2023-03-31 LAB — CYCLIC CITRUL PEPTIDE ANTIBODY, IGG: Cyclic Citrullin Peptide Ab: <16 U

## 2023-04-04 ENCOUNTER — Ambulatory Visit: Payer: 59

## 2023-04-18 ENCOUNTER — Other Ambulatory Visit: Payer: 59

## 2023-04-19 ENCOUNTER — Encounter: Payer: Self-pay | Admitting: Family Medicine

## 2023-04-19 ENCOUNTER — Ambulatory Visit (INDEPENDENT_AMBULATORY_CARE_PROVIDER_SITE_OTHER): Payer: 59 | Admitting: Family Medicine

## 2023-04-19 VITALS — BP 116/68 | HR 68 | Temp 97.5°F | Ht 64.0 in | Wt 144.5 lb

## 2023-04-19 DIAGNOSIS — R232 Flushing: Secondary | ICD-10-CM | POA: Insufficient documentation

## 2023-04-19 DIAGNOSIS — Z0000 Encounter for general adult medical examination without abnormal findings: Secondary | ICD-10-CM | POA: Insufficient documentation

## 2023-04-19 DIAGNOSIS — F5104 Psychophysiologic insomnia: Secondary | ICD-10-CM | POA: Diagnosis not present

## 2023-04-19 DIAGNOSIS — R768 Other specified abnormal immunological findings in serum: Secondary | ICD-10-CM | POA: Diagnosis not present

## 2023-04-19 DIAGNOSIS — G4486 Cervicogenic headache: Secondary | ICD-10-CM

## 2023-04-19 DIAGNOSIS — G43711 Chronic migraine without aura, intractable, with status migrainosus: Secondary | ICD-10-CM

## 2023-04-19 DIAGNOSIS — F411 Generalized anxiety disorder: Secondary | ICD-10-CM

## 2023-04-19 NOTE — Assessment & Plan Note (Signed)
Isolated episodes of flushing associated with spikes in BP up to 160 systolic, as well as endorsed periods of palpitations.  No headache with this.  I have asked her to collect 24 hour urine catecholamine/metanephrine collection next episode to evaluate for pheochromocytoma.

## 2023-04-19 NOTE — Assessment & Plan Note (Addendum)
Chronic. This likely contributes to chronic insomnia. She declines trazodone or SNRI trial. Previous intolerance to SSRIs and buspar. Lorazepam hasn't been effective.

## 2023-04-19 NOTE — Patient Instructions (Addendum)
Schedule well woman up with GYN.  Next flushing episode with high blood pressure - start collecting 24 hour urine - ordered today.  Return as needed or in 1 year for next physical.   Bedtime routine checklist: 1. Avoid naps during the day 2. Avoid stimulants such as caffeine and nicotine. Avoid bedtime alcohol (it can speed onset of sleep but the body's metabolism can cause awakenings). 3. All forms of exercise help ensure sound sleep - limit vigorous exercise to morning or late afternoon 4. Avoid food too close to bedtime including chocolate (which contains caffeine) 5. Soak up natural light 6. Establish regular bedtime routine. 7. Associate bed with sleep - avoid TV, computer or phone, reading while in bed. 8. Ensure pleasant, relaxing sleep environment - quiet, dark, cool room.

## 2023-04-19 NOTE — Assessment & Plan Note (Signed)
Preventative protocols reviewed and updated unless pt declined. Discussed healthy diet and lifestyle.  She will contact GYN to schedule well woman exam.

## 2023-04-19 NOTE — Assessment & Plan Note (Addendum)
Chronic sleep maintenance insomnia.  Tried and failed melatonin, benadryl (paradoxical effect) Lorazepam has not been beneficial.  See above. Sleep hygiene handout provided.  Declines trial of trazodone.

## 2023-04-19 NOTE — Progress Notes (Signed)
Ph: [phone] Fax: [phone]   Patient ID: Michelle Rhodes, female    DOB: 1981-02-08, 42 y.o.   MRN: [id number]  This visit was conducted in person.  BP 116/68   Pulse 68   Temp (!) 97.5 F (36.4 C) (Temporal)   Ht 5\' 4"  (1.626 m)   Wt 144 lb 8 oz (65.5 kg)   SpO2 99%   BMI 24.80 kg/m    CC: CPE Subjective:   HPI: Michelle Rhodes is a 42 y.o. female presenting on 04/19/2023 for Annual Exam   I saw grandmother Luetta Nutting until she passed away 2021-11-16.  Mother with stroke x2 passed away remotely in her 69s, no known immediate family with brain aneurysms.   GAD worsening over the past year. Last visit we started lorazepam 0.5mg  BID PRN, referred to counselor. To consider SNRI vs trazodone. Didn't find lorazepam was effective. Declines trial of trazodone. Will try sleep apps vs white noise machine.  Chronic sleep maintenance insomnia - 4 hours average. Has bedtime routine.  No benefit with melatonin or benadryl.   Migraines since age 83yo followed by [facility] Dr Lucia Gaskins on botox injections Q2mo (last 02/2023) with Nurtec 75mg  daily PRN and flexeril 10mg  PRN abortively. Summer is worst months.  MRI brain with and without contrast 2020 - stable scattered foci of non-specific T2 hyperintensities L>R frontal subcortical white matter most likely thought to represent minimal chronic microvascular ischemic change with the sequela of migraine headache.  Rpt MRI with and without contrast 02/2023: No acute findings.  Normal enhancement pattern. Scattered T2/FLAIR hyperintense foci in the subcortical white matter of the cerebral hemispheres.  These are nonspecific and most likely represents sequela of mild chronic microvascular ischemic change, migraine or trauma.  They are not typical for demyelination. 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 More recently saw sports medicine Dr Michiel Sites for cervicogenic headache - managed with trigger point injection with benefit.   Recently found to have mildly elevated ANA with low titers (1:40).  This was checked for episodes of flushing with high blood pressures, but without palpitations or headache.   Reviewed records from surgery 2016 - she had BTL for AUB, hysteroscopy, and novasure ablation. Ovaries remain. I am unable to change surgical history to remove "oophorectomy".  Sees GYN - due for f/u   Preventative: Colon cancer screening - not due Well woman exam through GYN [facility]) - will call to schedule f/u as due Mammo dx 07/2022 - [facility] DEXA scan - not due Lung cancer screening - not due Flu shot - yearly COVID shot - x2 Tetanus UTD Pneumonia shot - not due Shingrix - not due Advanced directive discussion -  Seat belt use discussed Sunscreen use discussed. No changing moles on skin. Sleep - averaging 4 hours/night  Non smoker  Alcohol - seldom Dentist - q6 mo Eye exam - yearly ([facility])   Right handed Lives at home with son, daughter and longterm partner. Caffeine use: tea occasionally with dinner Occ: [facility] and [place] on weekends  Activity: goes to gym 3d/wk Diet: good water, fruits/vegetables daily      Relevant past medical, surgical, family and social history reviewed and updated as indicated. Interim medical history since our last visit reviewed. Allergies and medications reviewed and updated. Outpatient Medications Prior to Visit  Medication Sig Dispense Refill   botulinum toxin Type A (BOTOX) 100 units SOLR injection PROVIDER TO INJECT 155 UNITS  INTRAMUSCULARLY INTO HEAD AND NECK EVERY 3 MONTHS. DISCARD REMAINDER. 2 each 0   cyclobenzaprine (FLEXERIL) 10 MG tablet Take 1 tablet (10 mg total) by mouth at bedtime. 90 tablet 0   LORazepam (ATIVAN) 0.5 MG tablet Take 1 tablet (0.5 mg total) by mouth 2 (two) times daily as needed for anxiety. 30 tablet 0   Rimegepant Sulfate (NURTEC) 75 MG TBDP Take 1 tablet (75 mg total) by mouth daily as  needed (migraine). 16 tablet 11   predniSONE (DELTASONE) 10 MG tablet Take 3 tablets by mouth x 3 days and then 2 tabs x3 days and then 1 tab x3 days.  Take with food. 18 tablet 0   promethazine-dextromethorphan (PROMETHAZINE-DM) 6.25-15 MG/5ML syrup Take 5 mLs by mouth 4 (four) times daily as needed for cough. 118 mL 0   Facility-Administered Medications Prior to Visit  Medication Dose Route Frequency Provider Last Rate Last Admin   botulinum toxin Type A (BOTOX) injection 155 Units  155 Units Intramuscular Once Anson Fret, MD         Per HPI unless specifically indicated in ROS section below Review of Systems  Constitutional:  Negative for activity change, appetite change, chills, fatigue, fever and unexpected weight change.  HENT:  Negative for hearing loss.   Eyes:  Negative for visual disturbance.  Respiratory:  Negative for cough, chest tightness, shortness of breath and wheezing.   Cardiovascular:  Negative for chest pain, palpitations and leg swelling.  Gastrointestinal:  Negative for abdominal distention, abdominal pain, blood in stool, constipation, diarrhea, nausea and vomiting.  Genitourinary:  Negative for difficulty urinating and hematuria.  Musculoskeletal:  Negative for arthralgias, myalgias and neck pain.  Skin:  Negative for rash.  Neurological:  Negative for dizziness, seizures, syncope and headaches.  Hematological:  Negative for adenopathy. Does not bruise/bleed easily.  Psychiatric/Behavioral:  Negative for dysphoric mood. The patient is not nervous/anxious.     Objective:  BP 116/68   Pulse 68   Temp (!) 97.5 F (36.4 C) (Temporal)   Ht 5\' 4"  (1.626 m)   Wt 144 lb 8 oz (65.5 kg)   SpO2 99%   BMI 24.80 kg/m   Wt Readings from Last 3 Encounters:  04/19/23 144 lb 8 oz (65.5 kg)  03/28/23 143 lb (64.9 kg)  02/07/23 146 lb (66.2 kg)      Physical Exam Vitals and nursing note reviewed.  Constitutional:      Appearance: Normal appearance. She is not  ill-appearing.  HENT:     Head: Normocephalic and atraumatic.     Right Ear: Tympanic membrane, ear canal and external ear normal. There is no impacted cerumen.     Left Ear: Tympanic membrane, ear canal and external ear normal. There is no impacted cerumen.     Nose: Nose normal.     Mouth/Throat:     Mouth: Mucous membranes are moist.     Pharynx: Oropharynx is clear. No oropharyngeal exudate or posterior oropharyngeal erythema.  Eyes:     General:        Right eye: No discharge.        Left eye: No discharge.     Extraocular Movements: Extraocular movements intact.     Conjunctiva/sclera: Conjunctivae normal.     Pupils: Pupils are equal, round, and reactive to light.  Neck:     Thyroid: No thyroid mass or thyromegaly.  Cardiovascular:     Rate and Rhythm: Normal rate and regular rhythm.     Pulses:  Normal pulses.     Heart sounds: Normal heart sounds. No murmur heard. Pulmonary:     Effort: Pulmonary effort is normal. No respiratory distress.     Breath sounds: Normal breath sounds. No wheezing, rhonchi or rales.  Abdominal:     General: Bowel sounds are normal. There is no distension.     Palpations: Abdomen is soft. There is no mass.     Tenderness: There is no abdominal tenderness. There is no guarding or rebound.     Hernia: No hernia is present.  Musculoskeletal:     Cervical back: Normal range of motion and neck supple. No rigidity.     Right lower leg: No edema.     Left lower leg: No edema.  Lymphadenopathy:     Cervical: No cervical adenopathy.  Skin:    General: Skin is warm and dry.     Findings: No rash.  Neurological:     General: No focal deficit present.     Mental Status: She is alert. Mental status is at baseline.  Psychiatric:        Mood and Affect: Mood normal.        Behavior: Behavior normal.       Results for orders placed or performed in visit on 03/28/23  Centromere Antibodies  Result Value Ref Range   Centromere Ab Screen <1.0 NEG <1.0  NEG AI  VITAMIN D 25 Hydroxy (Vit-D Deficiency, Fractures)  Result Value Ref Range   VITD 44.17 30.00 - 100.00 ng/mL  Vitamin B12  Result Value Ref Range   Vitamin B-12 673 211 - 911 pg/mL  Uric acid  Result Value Ref Range   Uric Acid, Serum 2.4 2.4 - 7.0 mg/dL  TSH  Result Value Ref Range   TSH 1.54 0.35 - 5.50 uIU/mL  Sedimentation rate  Result Value Ref Range   Sed Rate 3 0 - 20 mm/hr  Rheumatoid factor  Result Value Ref Range   Rheumatoid fact SerPl-aCnc <10 <14 IU/mL  PTH, intact and calcium  Result Value Ref Range   PTH 51 16 - 77 pg/mL   Calcium 9.9 8.6 - 10.2 mg/dL  IBC panel  Result Value Ref Range   Iron 109 42 - 145 ug/dL   Transferrin 161.0 960.4 - 360.0 mg/dL   Saturation Ratios 54.0 20.0 - 50.0 %   TIBC 369.6 250.0 - 450.0 mcg/dL  Ferritin  Result Value Ref Range   Ferritin 46.0 10.0 - 291.0 ng/mL  Cyclic citrul peptide antibody, IgG  Result Value Ref Range   Cyclic Citrullin Peptide Ab <98 UNITS  C-reactive protein  Result Value Ref Range   CRP <1.0 0.5 - 20.0 mg/dL  Comprehensive metabolic panel  Result Value Ref Range   Sodium 132 (L) 135 - 145 mEq/L   Potassium 3.7 3.5 - 5.1 mEq/L   Chloride 99 96 - 112 mEq/L   CO2 25 19 - 32 mEq/L   Glucose, Bld 88 70 - 99 mg/dL   BUN 11 6 - 23 mg/dL   Creatinine, Ser 1.19 0.40 - 1.20 mg/dL   Total Bilirubin 0.5 0.2 - 1.2 mg/dL   Alkaline Phosphatase 34 (L) 39 - 117 U/L   AST 16 0 - 37 U/L   ALT 11 0 - 35 U/L   Total Protein 7.3 6.0 - 8.3 g/dL   Albumin 4.8 3.5 - 5.2 g/dL   GFR 147.82 >95.62 mL/min   Calcium 9.6 8.4 - 10.5 mg/dL  CBC with Differential/Platelet  Result Value Ref Range   WBC 4.9 4.0 - 10.5 K/uL   RBC 4.03 3.87 - 5.11 Mil/uL   Hemoglobin 13.4 12.0 - 15.0 g/dL   HCT 29.5 62.1 - 30.8 %   MCV 97.3 78.0 - 100.0 fl   MCHC 34.2 30.0 - 36.0 g/dL   RDW 65.7 84.6 - 96.2 %   Platelets 366.0 150.0 - 400.0 K/uL   Neutrophils Relative % 61.8 43.0 - 77.0 %   Lymphocytes Relative 28.6 12.0 - 46.0 %    Monocytes Relative 8.0 3.0 - 12.0 %   Eosinophils Relative 0.7 0.0 - 5.0 %   Basophils Relative 0.9 0.0 - 3.0 %   Neutro Abs 3.0 1.4 - 7.7 K/uL   Lymphs Abs 1.4 0.7 - 4.0 K/uL   Monocytes Absolute 0.4 0.1 - 1.0 K/uL   Eosinophils Absolute 0.0 0.0 - 0.7 K/uL   Basophils Absolute 0.0 0.0 - 0.1 K/uL  Calcium, ionized  Result Value Ref Range   Calcium, Ion 5.2 4.7 - 5.5 mg/dL  Angiotensin converting enzyme  Result Value Ref Range   Angiotensin-Converting Enzyme 21 9 - 67 U/L  ANA  Result Value Ref Range   Anti Nuclear Antibody (ANA) POSITIVE (A) NEGATIVE  Anti-nuclear ab-titer (ANA titer)  Result Value Ref Range   ANA Titer 1 1:40 (H) titer   ANA Pattern 1 Nuclear, Speckled (A)    Lab Results  Component Value Date   CHOL 191 01/30/2023   HDL 73.60 01/30/2023   LDLCALC 109 (H) 01/30/2023   TRIG 45.0 01/30/2023   CHOLHDL 3 01/30/2023    Assessment & Plan:   Problem List Items Addressed This Visit     Health maintenance examination - Primary (Chronic)    Preventative protocols reviewed and updated unless pt declined. Discussed healthy diet and lifestyle.  She will contact GYN to schedule well woman exam.       Chronic migraine without aura, with intractable migraine, so stated, with status migrainosus    Appreciate neurology care, on botox Q3 mo preventatively and nurtec/flexeril abortively.       Cervicogenic headache    Appreciate sports medicine care, receiving trigger point injections.       GAD (generalized anxiety disorder)    Chronic. This likely contributes to chronic insomnia. She declines trazodone or SNRI trial. Previous intolerance to SSRIs and buspar. Lorazepam hasn't been effective.       Flushing    Isolated episodes of flushing associated with spikes in BP up to 160 systolic, as well as endorsed periods of palpitations.  No headache with this.  I have asked her to collect 24 hour urine catecholamine/metanephrine collection next episode to evaluate  for pheochromocytoma.       Relevant Orders   Catecholamine+VMA, 24-Hr Urine   Positive ANA (antinuclear antibody)    Anticipate false positive given persistently low titers 1:40      Chronic insomnia    Chronic sleep maintenance insomnia.  Tried and failed melatonin, benadryl (paradoxical effect) Lorazepam has not been beneficial.  See above. Sleep hygiene handout provided.  Declines trial of trazodone.         No orders of the defined types were placed in this encounter.   Orders Placed This Encounter  Procedures   Catecholamine+VMA, 24-Hr Urine    Standing Status:   Future    Standing Expiration Date:   04/18/2024    Patient Instructions  Schedule well woman up with GYN.  Next flushing episode with high blood  pressure - start collecting 24 hour urine - ordered today.  Return as needed or in 1 year for next physical.   Bedtime routine checklist: 1. Avoid naps during the day 2. Avoid stimulants such as caffeine and nicotine. Avoid bedtime alcohol (it can speed onset of sleep but the body's metabolism can cause awakenings). 3. All forms of exercise help ensure sound sleep - limit vigorous exercise to morning or late afternoon 4. Avoid food too close to bedtime including chocolate (which contains caffeine) 5. Soak up natural light 6. Establish regular bedtime routine. 7. Associate bed with sleep - avoid TV, computer or phone, reading while in bed. 8. Ensure pleasant, relaxing sleep environment - quiet, dark, cool room.   Follow up plan: Return in about 1 year (around 04/18/2024) for annual exam, prior fasting for blood work.  Eustaquio Boyden, MD

## 2023-04-19 NOTE — Assessment & Plan Note (Signed)
Appreciate neurology care, on botox Q3 mo preventatively and nurtec/flexeril abortively.

## 2023-04-19 NOTE — Assessment & Plan Note (Signed)
Appreciate sports medicine care, receiving trigger point injections.

## 2023-04-19 NOTE — Assessment & Plan Note (Signed)
Anticipate false positive given persistently low titers 1:40

## 2023-04-29 ENCOUNTER — Encounter: Payer: Self-pay | Admitting: Neurology

## 2023-05-08 NOTE — Progress Notes (Deleted)
  [name] [address] [place] [address] Phone: [phone] Subjective:    I'm seeing this patient by the request  of:  Eustaquio Boyden, MD  CC:   [id number]  Michelle Rhodes is a 42 y.o. female coming in with complaint of back and neck pain. OMT 03/28/2023.  Patient states   Medications patient has been prescribed: None  Taking:         Reviewed prior external information including notes and imaging from previsou exam, outside providers and external EMR if available.   As well as notes that were available from care everywhere and other healthcare systems.  Past medical history, social, surgical and family history all reviewed in electronic medical record.  No pertanent information unless stated regarding to the chief complaint.   Past Medical History:  Diagnosis Date   Anxiety    Asthma    as a child   Migraine     No Known Allergies   Review of Systems:  No headache, visual changes, nausea, vomiting, diarrhea, constipation, dizziness, abdominal pain, skin rash, fevers, chills, night sweats, weight loss, swollen lymph nodes, body aches, joint swelling, chest pain, shortness of breath, mood changes. POSITIVE muscle aches  Objective  There were no vitals taken for this visit.   General: No apparent distress alert and oriented x3 mood and affect normal, dressed appropriately.  HEENT: Pupils equal, extraocular movements intact  Respiratory: Patient's speak in full sentences and does not appear short of breath  Cardiovascular: No lower extremity edema, non tender, no erythema  Gait MSK:  Back   Osteopathic findings  C2 flexed rotated and side bent right C6 flexed rotated and side bent left T3 extended rotated and side bent right inhaled rib T9 extended rotated and side bent left L2 flexed rotated and side bent right Sacrum right on right       Assessment and Plan:  No problem-specific Assessment & Plan  notes found for this encounter.    Nonallopathic problems  Decision today to treat with OMT was based on Physical Exam  After verbal consent patient was treated with HVLA, ME, FPR techniques in cervical, rib, thoracic, lumbar, and sacral  areas  Patient tolerated the procedure well with improvement in symptoms  Patient given exercises, stretches and lifestyle modifications  See medications in patient instructions if given  Patient will follow up in 4-8 weeks             Note: This dictation was prepared with Dragon dictation along with smaller phrase technology. Any transcriptional errors that result from this process are unintentional.

## 2023-05-09 ENCOUNTER — Ambulatory Visit: Payer: 59 | Admitting: Family Medicine

## 2023-05-10 ENCOUNTER — Encounter: Payer: Self-pay | Admitting: Neurology

## 2023-05-15 ENCOUNTER — Other Ambulatory Visit: Payer: Self-pay | Admitting: Neurology

## 2023-05-15 ENCOUNTER — Other Ambulatory Visit: Payer: Self-pay | Admitting: Family Medicine

## 2023-05-16 ENCOUNTER — Ambulatory Visit: Payer: 59 | Admitting: Neurology

## 2023-05-16 DIAGNOSIS — G43711 Chronic migraine without aura, intractable, with status migrainosus: Secondary | ICD-10-CM | POA: Diagnosis not present

## 2023-05-16 MED ORDER — ONABOTULINUMTOXINA 100 UNITS IJ SOLR
155.0000 [IU] | Freq: Once | INTRAMUSCULAR | Status: AC
Start: 2023-05-16 — End: 2023-05-16
  Administered 2023-05-16: 155 [IU] via INTRAMUSCULAR

## 2023-05-16 NOTE — Progress Notes (Signed)
Botox- 100 units x 2 vials Lot: [id number]  Expiration: 07/2025 NDC: 6433-2951-88  Bacteriostatic 0.9% Sodium Chloride- 4 mL  Lot: [id number] Expiration: 11/12/2023 NDC: 4166-0630-16  Dx:G43.711  B/B Witnessed by Delmer Islam

## 2023-05-16 NOTE — Progress Notes (Signed)
Consent Form 05/16/2023: Doing great, Michelle Rhodes has > 70% improvement in migraine frequency and severity 02/24/2023: Have recommended MRI of the brain in the past. Michelle Rhodes would like to go forward with it due to concerning symptoms including: MRI brain due to concerning symptoms of abnormal white matter changes to follow for progression and blurry vision - to look for vasculidities, demyelination(multiple sclerosis) or other brain - MS protocol. stable with migraines still doing very well. Michelle Rhodes has > 50% improvement in migraine frequency and severity  No orders of the defined types were placed in this encounter.    Botulism Toxin Injection For Chronic Migraine 11/20/2022: Stable 08/23/2022: Stable still doing very well. Michelle Rhodes has > 50% improvement in migraine frequency and severity. Now with botox Michelle Rhodes has 4 migraine days a month and < 8 total headache days a month.   05/23/2022: Stable still doing very well. Michelle Rhodes has > 50% improvement in migraine frequency and severity. Now with botox Michelle Rhodes has 4 migraine days a month and < 8 total headache days a month.     719/2023: going great, stable, >> 50% decrease in freq and severity of migraines and headaches.   4/5/2023[name] 08/23/2021: doing great as far migraines go but Michelle Rhodes continues to have cervical myofascial pain, musculoskletal neck pain, has tried dry needling, PT, muscle relaxers. Refer to specialist.. MRI cervical spine 2018 was normal. Personally reviewed images with patient (additional 10 minutes on personal review on CD in our office if no improvement recommend repeat MRI cervical spine worsening pain, decreased ROM, crepitus) MRI c-spine 2018: :  Metal artifact slightly obscures the upper anterior spine. On sagittal images, the spine is imaged from above the cervicomedullary junction to T2.   The spinal cord is of normal caliber and signal.   There is some straightening of the cervical curvature. No spondylolisthesis is noted..   The vertebral  bodies have normal signal.  The discs and interspaces were further evaluated on axial views from C2 to T1.   The discs were normal at each of the cervical levels. There was no spondylosis. The neural foramina are widely patent at every level and there is no nerve root compression.After the infusion of contrast, a normal enhancement pattern was observed IMPRESSION:  This is a normal MRI of the cervical spine with and without contrast.  No orders of the defined types were placed in this encounter.  I spent 20 minutes of face-to-face and non-face-to-face time with patient on the  1. Chronic migraine without aura, with intractable migraine, so stated, with status migrainosus      diagnosis.  This included previsit chart review, lab review, study review, order entry, electronic health record documentation, patient education on the different diagnostic and therapeutic options, counseling and coordination of care, risks and benefits of management, compliance, or risk factor reduction. This does not include time spent on botox.    05/27/2021: Stable, still excellent response, > 80% improvement with freg of migraines   Reviewed orally with patient, additionally signature is on file:  Botulism toxin has been approved by the Federal drug administration for treatment of chronic migraine. Botulism toxin does not cure chronic migraine and it may not be effective in some patients.  The administration of botulism toxin is accomplished by injecting a small amount of toxin into the muscles of the neck and head. Dosage must be titrated for each individual. Any benefits resulting from botulism toxin tend to wear off after 3 months with a repeat injection required  if benefit is to be maintained. Injections are usually done every 3-4 months with maximum effect peak achieved by about 2 or 3 weeks. Botulism toxin is expensive and you should be sure of what costs you will incur resulting from the injection.  The side  effects of botulism toxin use for chronic migraine may include:   -Transient, and usually mild, facial weakness with facial injections  -Transient, and usually mild, head or neck weakness with head/neck injections  -Reduction or loss of forehead facial animation due to forehead muscle weakness  -Eyelid drooping  -Dry eye  -Pain at the site of injection or bruising at the site of injection  -Double vision  -Potential unknown long term risks  Contraindications: You should not have Botox if you are pregnant, nursing, allergic to albumin, have an infection, skin condition, or muscle weakness at the site of the injection, or have myasthenia gravis, Lambert-Eaton syndrome, or ALS.  It is also possible that as with any injection, there may be an allergic reaction or no effect from the medication. Reduced effectiveness after repeated injections is sometimes seen and rarely infection at the injection site may occur. All care will be taken to prevent these side effects. If therapy is given over a long time, atrophy and wasting in the muscle injected may occur. Occasionally the patient's become refractory to treatment because they develop antibodies to the toxin. In this event, therapy needs to be modified.  I have read the above information and consent to the administration of botulism toxin.    BOTOX PROCEDURE NOTE FOR MIGRAINE HEADACHE    Contraindications and precautions discussed with patient(above). Aseptic procedure was observed and patient tolerated procedure. Procedure performed by Dr. Artemio Aly  The condition has existed for more than 6 months, and pt does not have a diagnosis of ALS, Myasthenia Gravis or Lambert-Eaton Syndrome.  Risks and benefits of injections discussed and pt agrees to proceed with the procedure.  Written consent obtained  These injections are medically necessary. Pt  receives good benefits from these injections. These injections do not cause sedations or hallucinations  which the oral therapies may cause.  Description of procedure:  The patient was placed in a sitting position. The standard protocol was used for Botox as follows, with 5 units of Botox injected at each site:   -Procerus muscle, midline injection  -Corrugator muscle, bilateral injection  -Frontalis muscle, bilateral injection, with 2 sites each side, medial injection was performed in the upper one third of the frontalis muscle, in the region vertical from the medial inferior edge of the superior orbital rim. The lateral injection was again in the upper one third of the forehead vertically above the lateral limbus of the cornea, 1.5 cm lateral to the medial injection site.  - Levator Scapulae: 5 units bilaterally  -Temporalis muscle injection, 5 sites, bilaterally. The first injection was 3 cm above the tragus of the ear, second injection site was 1.5 cm to 3 cm up from the first injection site in line with the tragus of the ear. The third injection site was 1.5-3 cm forward between the first 2 injection sites. The fourth injection site was 1.5 cm posterior to the second injection site. 5th site laterally in the temporalis  muscleat the level of the outer canthus.  - Patient feels her clenching is a trigger for headaches. +5 units masseter bilaterally   - Patient feels the migraines are centered around the eyes +5 units bilaterally at the outer canthus in the  orbicularis occuli  -Occipitalis muscle injection, 3 sites, bilaterally. The first injection was done one half way between the occipital protuberance and the tip of the mastoid process behind the ear. The second injection site was done lateral and superior to the first, 1 fingerbreadth from the first injection. The third injection site was 1 fingerbreadth superiorly and medially from the first injection site.  -Cervical paraspinal muscle injection, 2 sites, bilateral knee first injection site was 1 cm from the midline of the cervical spine,  3 cm inferior to the lower border of the occipital protuberance. The second injection site was 1.5 cm superiorly and laterally to the first injection site.  -Trapezius muscle injection was performed at 3 sites, bilaterally. The first injection site was in the upper trapezius muscle halfway between the inflection point of the neck, and the acromion. The second injection site was one half way between the acromion and the first injection site. The third injection was done between the first injection site and the inflection point of the neck.   Will return for repeat injection in 3 months.   155 unit sof Botox was used, 45U Botox not injected was wasted. The patient tolerated the procedure well, there were no complications of the above procedure.

## 2023-05-20 ENCOUNTER — Other Ambulatory Visit (HOSPITAL_BASED_OUTPATIENT_CLINIC_OR_DEPARTMENT_OTHER): Payer: Self-pay

## 2023-05-20 DIAGNOSIS — Z0289 Encounter for other administrative examinations: Secondary | ICD-10-CM

## 2023-05-20 MED ORDER — CYCLOBENZAPRINE HCL 10 MG PO TABS
10.0000 mg | ORAL_TABLET | Freq: Every day | ORAL | 0 refills | Status: DC
  Filled 2023-06-04 – 2023-07-02 (×3): qty 90, 90d supply, fill #0

## 2023-05-21 ENCOUNTER — Other Ambulatory Visit (HOSPITAL_BASED_OUTPATIENT_CLINIC_OR_DEPARTMENT_OTHER): Payer: Self-pay

## 2023-05-21 MED ORDER — LORAZEPAM 0.5 MG PO TABS
0.5000 mg | ORAL_TABLET | Freq: Two times a day (BID) | ORAL | 0 refills | Status: DC | PRN
  Filled 2023-06-04: qty 30, 15d supply, fill #0

## 2023-05-21 NOTE — Telephone Encounter (Signed)
[employer] accomodation forms competed, placed in POD 4 for MD review and signature.

## 2023-05-21 NOTE — Telephone Encounter (Signed)
ERx 

## 2023-05-21 NOTE — Telephone Encounter (Signed)
Form signed and sent to medical records.

## 2023-05-22 ENCOUNTER — Telehealth: Payer: Self-pay | Admitting: *Deleted

## 2023-05-22 NOTE — Telephone Encounter (Signed)
Pt reliance matrix faxed on 05/22/2023

## 2023-05-29 ENCOUNTER — Telehealth: Payer: Self-pay

## 2023-05-29 ENCOUNTER — Other Ambulatory Visit (HOSPITAL_COMMUNITY): Payer: Self-pay

## 2023-05-29 NOTE — Telephone Encounter (Signed)
*  [facility]  Pharmacy Patient Advocate Encounter  Received notification from [facility] that Prior Authorization for Nurtec 75MG  dispersible tablets  has been APPROVED from 05/29/2023 to 05/27/2024. Ran test claim, Copay is $refill too soon. This test claim was processed through [facility]- copay amounts may vary at other pharmacies due to pharmacy/plan contracts, or as the patient moves through the different stages of their insurance plan.   PA #/Case ID/Reference #: [facility]

## 2023-06-04 ENCOUNTER — Other Ambulatory Visit: Payer: Self-pay

## 2023-06-04 ENCOUNTER — Other Ambulatory Visit (HOSPITAL_BASED_OUTPATIENT_CLINIC_OR_DEPARTMENT_OTHER): Payer: Self-pay

## 2023-06-14 ENCOUNTER — Other Ambulatory Visit (HOSPITAL_BASED_OUTPATIENT_CLINIC_OR_DEPARTMENT_OTHER): Payer: Self-pay

## 2023-06-16 ENCOUNTER — Other Ambulatory Visit (HOSPITAL_BASED_OUTPATIENT_CLINIC_OR_DEPARTMENT_OTHER): Payer: Self-pay

## 2023-06-27 ENCOUNTER — Other Ambulatory Visit (HOSPITAL_BASED_OUTPATIENT_CLINIC_OR_DEPARTMENT_OTHER): Payer: Self-pay

## 2023-06-28 ENCOUNTER — Other Ambulatory Visit (HOSPITAL_BASED_OUTPATIENT_CLINIC_OR_DEPARTMENT_OTHER): Payer: Self-pay

## 2023-06-29 ENCOUNTER — Other Ambulatory Visit
Admission: RE | Admit: 2023-06-29 | Discharge: 2023-06-29 | Disposition: A | Discharge status: Home / Self Care | Payer: 59 | Source: Ambulatory Visit | Attending: Oncology | Admitting: Oncology

## 2023-06-29 DIAGNOSIS — Z006 Encounter for examination for normal comparison and control in clinical research program: Secondary | ICD-10-CM | POA: Insufficient documentation

## 2023-07-02 ENCOUNTER — Other Ambulatory Visit (HOSPITAL_BASED_OUTPATIENT_CLINIC_OR_DEPARTMENT_OTHER): Payer: Self-pay

## 2023-07-02 ENCOUNTER — Encounter: Payer: Self-pay | Admitting: Family Medicine

## 2023-07-03 ENCOUNTER — Encounter: Payer: Self-pay | Admitting: Neurology

## 2023-07-04 MED ORDER — TOPAMAX 50 MG PO TABS: 50.0000 mg | ORAL_TABLET | Freq: Every day | ORAL | 3 refills | Status: DC

## 2023-07-04 NOTE — Telephone Encounter (Signed)
Last prescription was 02-21-2023 #90 with 2 refills. Brand name topamax.  She takes prn.

## 2023-07-08 ENCOUNTER — Other Ambulatory Visit (HOSPITAL_BASED_OUTPATIENT_CLINIC_OR_DEPARTMENT_OTHER): Payer: Self-pay

## 2023-07-09 ENCOUNTER — Other Ambulatory Visit (HOSPITAL_BASED_OUTPATIENT_CLINIC_OR_DEPARTMENT_OTHER): Payer: Self-pay

## 2023-07-09 ENCOUNTER — Encounter (HOSPITAL_BASED_OUTPATIENT_CLINIC_OR_DEPARTMENT_OTHER): Payer: Self-pay

## 2023-07-09 ENCOUNTER — Other Ambulatory Visit: Payer: Self-pay

## 2023-07-09 LAB — GENECONNECT MOLECULAR SCREEN: Genetic Analysis Overall Interpretation: NEGATIVE

## 2023-07-09 MED ORDER — TOPAMAX 50 MG PO TABS
50.0000 mg | ORAL_TABLET | Freq: Every day | ORAL | 3 refills | Status: DC
  Filled 2023-07-09: qty 90, 90d supply, fill #0

## 2023-07-09 NOTE — Addendum Note (Signed)
Addended by: Bertram Savin on: 07/09/2023 08:59 AM   Modules accepted: Orders

## 2023-07-10 ENCOUNTER — Other Ambulatory Visit: Payer: Self-pay | Admitting: *Deleted

## 2023-07-10 ENCOUNTER — Other Ambulatory Visit (HOSPITAL_BASED_OUTPATIENT_CLINIC_OR_DEPARTMENT_OTHER): Payer: Self-pay

## 2023-07-10 MED ORDER — TOPIRAMATE 50 MG PO TABS
50.0000 mg | ORAL_TABLET | Freq: Every day | ORAL | 3 refills | Status: DC
  Filled 2023-07-10 – 2023-07-24 (×2): qty 90, 90d supply, fill #0

## 2023-07-10 NOTE — Telephone Encounter (Signed)
Pharmacist called from [facility]. Per pt is wanting to get topiramate 50mg  take 1 daily #90 3 refills. (Not BRAND).  Redid rx.

## 2023-07-17 ENCOUNTER — Other Ambulatory Visit (HOSPITAL_BASED_OUTPATIENT_CLINIC_OR_DEPARTMENT_OTHER): Payer: Self-pay

## 2023-07-20 ENCOUNTER — Other Ambulatory Visit (HOSPITAL_BASED_OUTPATIENT_CLINIC_OR_DEPARTMENT_OTHER): Payer: Self-pay

## 2023-07-24 ENCOUNTER — Telehealth: Payer: Self-pay | Admitting: Neurology

## 2023-07-24 ENCOUNTER — Other Ambulatory Visit (HOSPITAL_BASED_OUTPATIENT_CLINIC_OR_DEPARTMENT_OTHER): Payer: Self-pay

## 2023-07-24 MED ORDER — NURTEC 75 MG PO TBDP
75.0000 mg | ORAL_TABLET | Freq: Every day | ORAL | 11 refills | Status: DC | PRN
  Filled 2023-07-24: qty 16, 30d supply, fill #0

## 2023-07-24 NOTE — Addendum Note (Signed)
Addended by: Bertram Savin on: 07/24/2023 04:49 PM   Modules accepted: Orders

## 2023-07-24 NOTE — Telephone Encounter (Signed)
Submitted a new auth via [employer] portal, previous auth expired 07/21/23. Pt has an upcoming appointment on 08/22/23. Berkley Harvey is pending, reference # [id number].

## 2023-07-25 ENCOUNTER — Other Ambulatory Visit (HOSPITAL_BASED_OUTPATIENT_CLINIC_OR_DEPARTMENT_OTHER): Payer: Self-pay

## 2023-07-25 NOTE — Telephone Encounter (Signed)
Auth was approved, pt will continue to be buy/bill.  Auth#: [id number] (07/24/23-01/20/24)

## 2023-08-22 ENCOUNTER — Ambulatory Visit: Payer: 59 | Admitting: Neurology

## 2023-08-22 NOTE — Progress Notes (Deleted)
 Consent Form 05/16/2023: Doing great, She has > 70% improvement in migraine frequency and severity 02/24/2023: Have recommended MRI of the brain in the past. She would like to go forward with it due to concerning symptoms including: MRI brain due to concerning symptoms of abnormal white matter changes to follow for progression and blurry vision - to look for vasculidities, demyelination(multiple sclerosis) or other brain - MS protocol. stable with migraines still doing very well. She has > 50% improvement in migraine frequency and severity  No orders of the defined types were placed in this encounter.    Botulism Toxin Injection For Chronic Migraine 11/20/2022: Stable 08/23/2022: Stable still doing very well. She has > 50% improvement in migraine frequency and severity. Now with botox  she has 4 migraine days a month and < 8 total headache days a month.   05/23/2022: Stable still doing very well. She has > 50% improvement in migraine frequency and severity. Now with botox  she has 4 migraine days a month and < 8 total headache days a month.     719/2023: going great, stable, >> 50% decrease in freq and severity of migraines and headaches.   4/5/2023BETHA Raisin McCue 08/23/2021: doing great as far migraines go but she continues to have cervical myofascial pain, musculoskletal neck pain, has tried dry needling, PT, muscle relaxers. Refer to specialist.. MRI cervical spine 2018 was normal. Personally reviewed images with patient (additional 10 minutes on personal review on CD in our office if no improvement recommend repeat MRI cervical spine worsening pain, decreased ROM, crepitus) MRI c-spine 2018: :  Metal artifact slightly obscures the upper anterior spine. On sagittal images, the spine is imaged from above the cervicomedullary junction to T2.   The spinal cord is of normal caliber and signal.   There is some straightening of the cervical curvature. No spondylolisthesis is noted..   The vertebral  bodies have normal signal.  The discs and interspaces were further evaluated on axial views from C2 to T1.   The discs were normal at each of the cervical levels. There was no spondylosis. The neural foramina are widely patent at every level and there is no nerve root compression.After the infusion of contrast, a normal enhancement pattern was observed IMPRESSION:  This is a normal MRI of the cervical spine with and without contrast.  No orders of the defined types were placed in this encounter.  I spent 20 minutes of face-to-face and non-face-to-face time with patient on the  No diagnosis found.    diagnosis.  This included previsit chart review, lab review, study review, order entry, electronic health record documentation, patient education on the different diagnostic and therapeutic options, counseling and coordination of care, risks and benefits of management, compliance, or risk factor reduction. This does not include time spent on botox .    05/27/2021: Stable, still excellent response, > 80% improvement with freg of migraines   Reviewed orally with patient, additionally signature is on file:  Botulism toxin has been approved by the Federal drug administration for treatment of chronic migraine. Botulism toxin does not cure chronic migraine and it may not be effective in some patients.  The administration of botulism toxin is accomplished by injecting a small amount of toxin into the muscles of the neck and head. Dosage must be titrated for each individual. Any benefits resulting from botulism toxin tend to wear off after 3 months with a repeat injection required if benefit is to be maintained. Injections are usually done every 3-4  months with maximum effect peak achieved by about 2 or 3 weeks. Botulism toxin is expensive and you should be sure of what costs you will incur resulting from the injection.  The side effects of botulism toxin use for chronic migraine may include:   -Transient,  and usually mild, facial weakness with facial injections  -Transient, and usually mild, head or neck weakness with head/neck injections  -Reduction or loss of forehead facial animation due to forehead muscle weakness  -Eyelid drooping  -Dry eye  -Pain at the site of injection or bruising at the site of injection  -Double vision  -Potential unknown long term risks  Contraindications: You should not have Botox  if you are pregnant, nursing, allergic to albumin, have an infection, skin condition, or muscle weakness at the site of the injection, or have myasthenia gravis, Lambert-Eaton syndrome, or ALS.  It is also possible that as with any injection, there may be an allergic reaction or no effect from the medication. Reduced effectiveness after repeated injections is sometimes seen and rarely infection at the injection site may occur. All care will be taken to prevent these side effects. If therapy is given over a long time, atrophy and wasting in the muscle injected may occur. Occasionally the patient's become refractory to treatment because they develop antibodies to the toxin. In this event, therapy needs to be modified.  I have read the above information and consent to the administration of botulism toxin.    BOTOX  PROCEDURE NOTE FOR MIGRAINE HEADACHE    Contraindications and precautions discussed with patient(above). Aseptic procedure was observed and patient tolerated procedure. Procedure performed by Dr. Andree Epp  The condition has existed for more than 6 months, and pt does not have a diagnosis of ALS, Myasthenia Gravis or Lambert-Eaton Syndrome.  Risks and benefits of injections discussed and pt agrees to proceed with the procedure.  Written consent obtained  These injections are medically necessary. Pt  receives good benefits from these injections. These injections do not cause sedations or hallucinations which the oral therapies may cause.  Description of procedure:  The patient  was placed in a sitting position. The standard protocol was used for Botox  as follows, with 5 units of Botox  injected at each site:   -Procerus muscle, midline injection  -Corrugator muscle, bilateral injection  -Frontalis muscle, bilateral injection, with 2 sites each side, medial injection was performed in the upper one third of the frontalis muscle, in the region vertical from the medial inferior edge of the superior orbital rim. The lateral injection was again in the upper one third of the forehead vertically above the lateral limbus of the cornea, 1.5 cm lateral to the medial injection site.  - Levator Scapulae: 5 units bilaterally  -Temporalis muscle injection, 5 sites, bilaterally. The first injection was 3 cm above the tragus of the ear, second injection site was 1.5 cm to 3 cm up from the first injection site in line with the tragus of the ear. The third injection site was 1.5-3 cm forward between the first 2 injection sites. The fourth injection site was 1.5 cm posterior to the second injection site. 5th site laterally in the temporalis  muscleat the level of the outer canthus.  - Patient feels her clenching is a trigger for headaches. +5 units masseter bilaterally   - Patient feels the migraines are centered around the eyes +5 units bilaterally at the outer canthus in the orbicularis occuli  -Occipitalis muscle injection, 3 sites, bilaterally. The first injection  was done one half way between the occipital protuberance and the tip of the mastoid process behind the ear. The second injection site was done lateral and superior to the first, 1 fingerbreadth from the first injection. The third injection site was 1 fingerbreadth superiorly and medially from the first injection site.  -Cervical paraspinal muscle injection, 2 sites, bilateral knee first injection site was 1 cm from the midline of the cervical spine, 3 cm inferior to the lower border of the occipital protuberance. The second  injection site was 1.5 cm superiorly and laterally to the first injection site.  -Trapezius muscle injection was performed at 3 sites, bilaterally. The first injection site was in the upper trapezius muscle halfway between the inflection point of the neck, and the acromion. The second injection site was one half way between the acromion and the first injection site. The third injection was done between the first injection site and the inflection point of the neck.   Will return for repeat injection in 3 months.   155 unit sof Botox  was used, 45U Botox  not injected was wasted. The patient tolerated the procedure well, there were no complications of the above procedure.

## 2023-08-26 ENCOUNTER — Encounter: Payer: Self-pay | Admitting: Neurology

## 2023-08-30 NOTE — Progress Notes (Deleted)
  [name] [address] [place] [address] Phone: [phone] Subjective:    I'm seeing this patient by the request  of:  Eustaquio Boyden, MD  CC: Back and neck pain follow-up  [id number]  Michelle Rhodes is a 43 y.o. female coming in with complaint of back and neck pain. OMT on 03/28/2023. Patient states   Medications patient has been prescribed:   Taking:         Reviewed prior external information including notes and imaging from previsou exam, outside providers and external EMR if available.   As well as notes that were available from care everywhere and other healthcare systems.  Past medical history, social, surgical and family history all reviewed in electronic medical record.  No pertanent information unless stated regarding to the chief complaint.   Past Medical History:  Diagnosis Date   Anxiety    Asthma    as a child   Migraine     No Known Allergies   Review of Systems:  No headache, visual changes, nausea, vomiting, diarrhea, constipation, dizziness, abdominal pain, skin rash, fevers, chills, night sweats, weight loss, swollen lymph nodes, body aches, joint swelling, chest pain, shortness of breath, mood changes. POSITIVE muscle aches  Objective  There were no vitals taken for this visit.   General: No apparent distress alert and oriented x3 mood and affect normal, dressed appropriately.  HEENT: Pupils equal, extraocular movements intact  Respiratory: Patient's speak in full sentences and does not appear short of breath  Cardiovascular: No lower extremity edema, non tender, no erythema  Gait MSK:  Back   Osteopathic findings  C2 flexed rotated and side bent right C6 flexed rotated and side bent left T3 extended rotated and side bent right inhaled rib T9 extended rotated and side bent left L2 flexed rotated and side bent right Sacrum right on right       Assessment and Plan:  No  problem-specific Assessment & Plan notes found for this encounter.    Nonallopathic problems  Decision today to treat with OMT was based on Physical Exam  After verbal consent patient was treated with HVLA, ME, FPR techniques in cervical, rib, thoracic, lumbar, and sacral  areas  Patient tolerated the procedure well with improvement in symptoms  Patient given exercises, stretches and lifestyle modifications  See medications in patient instructions if given  Patient will follow up in 4-8 weeks    The above documentation has been reviewed and is accurate and complete Judi Saa, DO          Note: This dictation was prepared with Dragon dictation along with smaller phrase technology. Any transcriptional errors that result from this process are unintentional.

## 2023-09-02 ENCOUNTER — Ambulatory Visit (INDEPENDENT_AMBULATORY_CARE_PROVIDER_SITE_OTHER): Payer: 59 | Admitting: Neurology

## 2023-09-02 DIAGNOSIS — G43711 Chronic migraine without aura, intractable, with status migrainosus: Secondary | ICD-10-CM

## 2023-09-02 MED ORDER — ONABOTULINUMTOXINA 200 UNITS IJ SOLR
155.0000 [IU] | Freq: Once | INTRAMUSCULAR | Status: AC
Start: 2023-09-02 — End: 2023-09-02
  Administered 2023-09-02: 155 [IU] via INTRAMUSCULAR

## 2023-09-02 NOTE — Progress Notes (Signed)
Consent Form 05/16/2023: Doing great, She has > 70% improvement in migraine frequency and severity 02/24/2023: Have recommended MRI of the brain in the past. She would like to go forward with it due to concerning symptoms including: MRI brain due to concerning symptoms of abnormal white matter changes to follow for progression and blurry vision - to look for vasculidities, demyelination(multiple sclerosis) or other brain - MS protocol. stable with migraines still doing very well. She has > 50% improvement in migraine frequency and severity  No orders of the defined types were placed in this encounter.    Botulism Toxin Injection For Chronic Migraine 11/20/2022: Stable 08/23/2022: Stable still doing very well. She has > 50% improvement in migraine frequency and severity. Now with botox she has 4 migraine days a month and < 8 total headache days a month.   05/23/2022: Stable still doing very well. She has > 50% improvement in migraine frequency and severity. Now with botox she has 4 migraine days a month and < 8 total headache days a month.     719/2023: going great, stable, >> 50% decrease in freq and severity of migraines and headaches.   4/5/2023[name] 08/23/2021: doing great as far migraines go but she continues to have cervical myofascial pain, musculoskletal neck pain, has tried dry needling, PT, muscle relaxers. Refer to specialist.. MRI cervical spine 2018 was normal. Personally reviewed images with patient (additional 10 minutes on personal review on CD in our office if no improvement recommend repeat MRI cervical spine worsening pain, decreased ROM, crepitus) MRI c-spine 2018: :  Metal artifact slightly obscures the upper anterior spine. On sagittal images, the spine is imaged from above the cervicomedullary junction to T2.   The spinal cord is of normal caliber and signal.   There is some straightening of the cervical curvature. No spondylolisthesis is noted..   The vertebral  bodies have normal signal.  The discs and interspaces were further evaluated on axial views from C2 to T1.   The discs were normal at each of the cervical levels. There was no spondylosis. The neural foramina are widely patent at every level and there is no nerve root compression.After the infusion of contrast, a normal enhancement pattern was observed IMPRESSION:  This is a normal MRI of the cervical spine with and without contrast.  No orders of the defined types were placed in this encounter.  I spent 20 minutes of face-to-face and non-face-to-face time with patient on the  1. Chronic migraine without aura, with intractable migraine, so stated, with status migrainosus      diagnosis.  This included previsit chart review, lab review, study review, order entry, electronic health record documentation, patient education on the different diagnostic and therapeutic options, counseling and coordination of care, risks and benefits of management, compliance, or risk factor reduction. This does not include time spent on botox.    05/27/2021: Stable, still excellent response, > 80% improvement with freg of migraines   Reviewed orally with patient, additionally signature is on file:  Botulism toxin has been approved by the Federal drug administration for treatment of chronic migraine. Botulism toxin does not cure chronic migraine and it may not be effective in some patients.  The administration of botulism toxin is accomplished by injecting a small amount of toxin into the muscles of the neck and head. Dosage must be titrated for each individual. Any benefits resulting from botulism toxin tend to wear off after 3 months with a repeat injection required  if benefit is to be maintained. Injections are usually done every 3-4 months with maximum effect peak achieved by about 2 or 3 weeks. Botulism toxin is expensive and you should be sure of what costs you will incur resulting from the injection.  The side  effects of botulism toxin use for chronic migraine may include:   -Transient, and usually mild, facial weakness with facial injections  -Transient, and usually mild, head or neck weakness with head/neck injections  -Reduction or loss of forehead facial animation due to forehead muscle weakness  -Eyelid drooping  -Dry eye  -Pain at the site of injection or bruising at the site of injection  -Double vision  -Potential unknown long term risks  Contraindications: You should not have Botox if you are pregnant, nursing, allergic to albumin, have an infection, skin condition, or muscle weakness at the site of the injection, or have myasthenia gravis, Lambert-Eaton syndrome, or ALS.  It is also possible that as with any injection, there may be an allergic reaction or no effect from the medication. Reduced effectiveness after repeated injections is sometimes seen and rarely infection at the injection site may occur. All care will be taken to prevent these side effects. If therapy is given over a long time, atrophy and wasting in the muscle injected may occur. Occasionally the patient's become refractory to treatment because they develop antibodies to the toxin. In this event, therapy needs to be modified.  I have read the above information and consent to the administration of botulism toxin.    BOTOX PROCEDURE NOTE FOR MIGRAINE HEADACHE    Contraindications and precautions discussed with patient(above). Aseptic procedure was observed and patient tolerated procedure. Procedure performed by Dr. Artemio Aly  The condition has existed for more than 6 months, and pt does not have a diagnosis of ALS, Myasthenia Gravis or Lambert-Eaton Syndrome.  Risks and benefits of injections discussed and pt agrees to proceed with the procedure.  Written consent obtained  These injections are medically necessary. Pt  receives good benefits from these injections. These injections do not cause sedations or hallucinations  which the oral therapies may cause.  Description of procedure:  The patient was placed in a sitting position. The standard protocol was used for Botox as follows, with 5 units of Botox injected at each site:   -Procerus muscle, midline injection  -Corrugator muscle, bilateral injection  -Frontalis muscle, bilateral injection, with 2 sites each side, medial injection was performed in the upper one third of the frontalis muscle, in the region vertical from the medial inferior edge of the superior orbital rim. The lateral injection was again in the upper one third of the forehead vertically above the lateral limbus of the cornea, 1.5 cm lateral to the medial injection site.  - Levator Scapulae: 5 units bilaterally  -Temporalis muscle injection, 5 sites, bilaterally. The first injection was 3 cm above the tragus of the ear, second injection site was 1.5 cm to 3 cm up from the first injection site in line with the tragus of the ear. The third injection site was 1.5-3 cm forward between the first 2 injection sites. The fourth injection site was 1.5 cm posterior to the second injection site. 5th site laterally in the temporalis  muscleat the level of the outer canthus.  - Patient feels her clenching is a trigger for headaches. +5 units masseter bilaterally   - Patient feels the migraines are centered around the eyes +5 units bilaterally at the outer canthus in the  orbicularis occuli  -Occipitalis muscle injection, 3 sites, bilaterally. The first injection was done one half way between the occipital protuberance and the tip of the mastoid process behind the ear. The second injection site was done lateral and superior to the first, 1 fingerbreadth from the first injection. The third injection site was 1 fingerbreadth superiorly and medially from the first injection site.  -Cervical paraspinal muscle injection, 2 sites, bilateral knee first injection site was 1 cm from the midline of the cervical spine,  3 cm inferior to the lower border of the occipital protuberance. The second injection site was 1.5 cm superiorly and laterally to the first injection site.  -Trapezius muscle injection was performed at 3 sites, bilaterally. The first injection site was in the upper trapezius muscle halfway between the inflection point of the neck, and the acromion. The second injection site was one half way between the acromion and the first injection site. The third injection was done between the first injection site and the inflection point of the neck.   Will return for repeat injection in 3 months.   155 unit sof Botox was used, 45U Botox not injected was wasted. The patient tolerated the procedure well, there were no complications of the above procedure.

## 2023-09-02 NOTE — Progress Notes (Signed)
Botox- 200 units x 1 vial Lot: [id number] Expiration: 10/2025 NDC: 1610-9604-54  Bacteriostatic 0.9% Sodium Chloride- 4 mL  Lot: [id number] Expiration: 11/12/2023 NDC: 1478-2956-21  Dx: H08.657 B/B Witnessed by Clemencia Course RN

## 2023-09-03 ENCOUNTER — Ambulatory Visit: Payer: 59 | Admitting: Family Medicine

## 2023-09-17 ENCOUNTER — Ambulatory Visit: Payer: 59 | Admitting: Obstetrics

## 2023-09-23 ENCOUNTER — Ambulatory Visit: Payer: 59 | Admitting: Neurology

## 2023-09-23 ENCOUNTER — Encounter: Payer: Self-pay | Admitting: Neurology

## 2023-09-24 ENCOUNTER — Ambulatory Visit (INDEPENDENT_AMBULATORY_CARE_PROVIDER_SITE_OTHER): Payer: 59 | Admitting: Obstetrics

## 2023-09-24 ENCOUNTER — Other Ambulatory Visit (HOSPITAL_COMMUNITY)
Admission: RE | Admit: 2023-09-24 | Discharge: 2023-09-24 | Disposition: A | Discharge status: Home / Self Care | Payer: 59 | Source: Ambulatory Visit | Attending: Obstetrics | Admitting: Obstetrics

## 2023-09-24 ENCOUNTER — Other Ambulatory Visit: Payer: Self-pay | Admitting: Neurology

## 2023-09-24 ENCOUNTER — Encounter: Payer: Self-pay | Admitting: Obstetrics

## 2023-09-24 VITALS — BP 105/67 | HR 69 | Ht 64.0 in | Wt 140.0 lb

## 2023-09-24 DIAGNOSIS — Z124 Encounter for screening for malignant neoplasm of cervix: Secondary | ICD-10-CM

## 2023-09-24 DIAGNOSIS — Z114 Encounter for screening for human immunodeficiency virus [HIV]: Secondary | ICD-10-CM

## 2023-09-24 DIAGNOSIS — Z01419 Encounter for gynecological examination (general) (routine) without abnormal findings: Secondary | ICD-10-CM

## 2023-09-24 DIAGNOSIS — Z1231 Encounter for screening mammogram for malignant neoplasm of breast: Secondary | ICD-10-CM

## 2023-09-24 DIAGNOSIS — Z131 Encounter for screening for diabetes mellitus: Secondary | ICD-10-CM | POA: Diagnosis not present

## 2023-09-24 DIAGNOSIS — Z13220 Encounter for screening for lipoid disorders: Secondary | ICD-10-CM

## 2023-09-24 MED ORDER — TOPIRAMATE 100 MG PO TABS: 100.0000 mg | ORAL_TABLET | Freq: Every day | ORAL | 3 refills | Status: DC

## 2023-09-24 NOTE — Patient Instructions (Signed)
Preventive Care 16-42 Years Old, Female  Preventive care refers to lifestyle choices and visits with your health care provider that can promote health and wellness. Preventive care visits are also called wellness exams.  What can I expect for my preventive care visit?  Counseling  Your health care provider may ask you questions about your:  Medical history, including:  Past medical problems.  Family medical history.  Pregnancy history.  Current health, including:  Menstrual cycle.  Method of birth control.  Emotional well-being.  Home life and relationship well-being.  Sexual activity and sexual health.  Lifestyle, including:  Alcohol, nicotine or tobacco, and drug use.  Access to firearms.  Diet, exercise, and sleep habits.  Work and work Astronomer.  Sunscreen use.  Safety issues such as seatbelt and bike helmet use.  Physical exam  Your health care provider will check your:  Height and weight. These may be used to calculate your BMI (body mass index). BMI is a measurement that tells if you are at a healthy weight.  Waist circumference. This measures the distance around your waistline. This measurement also tells if you are at a healthy weight and may help predict your risk of certain diseases, such as type 2 diabetes and high blood pressure.  Heart rate and blood pressure.  Body temperature.  Skin for abnormal spots.  What immunizations do I need?    Vaccines are usually given at various ages, according to a schedule. Your health care provider will recommend vaccines for you based on your age, medical history, and lifestyle or other factors, such as travel or where you work.  What tests do I need?  Screening  Your health care provider may recommend screening tests for certain conditions. This may include:  Lipid and cholesterol levels.  Diabetes screening. This is done by checking your blood sugar (glucose) after you have not eaten for a while (fasting).  Pelvic exam and Pap test.  Hepatitis B test.  Hepatitis C  test.  HIV (human immunodeficiency virus) test.  STI (sexually transmitted infection) testing, if you are at risk.  Lung cancer screening.  Colorectal cancer screening.  Mammogram. Talk with your health care provider about when you should start having regular mammograms. This may depend on whether you have a family history of breast cancer.  BRCA-related cancer screening. This may be done if you have a family history of breast, ovarian, tubal, or peritoneal cancers.  Bone density scan. This is done to screen for osteoporosis.  Talk with your health care provider about your test results, treatment options, and if necessary, the need for more tests.  Follow these instructions at home:  Eating and drinking    Eat a diet that includes fresh fruits and vegetables, whole grains, lean protein, and low-fat dairy products.  Take vitamin and mineral supplements as recommended by your health care provider.  Do not drink alcohol if:  Your health care provider tells you not to drink.  You are pregnant, may be pregnant, or are planning to become pregnant.  If you drink alcohol:  Limit how much you have to 0-1 drink a day.  Know how much alcohol is in your drink. In the [place] one drink equals one 12 oz bottle of beer (355 mL), one 5 oz glass of wine (148 mL), or one 1 oz glass of hard liquor (44 mL).  Lifestyle  Brush your teeth every morning and night with fluoride toothpaste. Floss one time each day.  Exercise for at least  30 minutes 5 or more days each week.  Do not use any products that contain nicotine or tobacco. These products include cigarettes, chewing tobacco, and vaping devices, such as e-cigarettes. If you need help quitting, ask your health care provider.  Do not use drugs.  If you are sexually active, practice safe sex. Use a condom or other form of protection to prevent STIs.  If you do not wish to become pregnant, use a form of birth control. If you plan to become pregnant, see your health care provider for a  prepregnancy visit.  Take aspirin only as told by your health care provider. Make sure that you understand how much to take and what form to take. Work with your health care provider to find out whether it is safe and beneficial for you to take aspirin daily.  Find healthy ways to manage stress, such as:  Meditation, yoga, or listening to music.  Journaling.  Talking to a trusted person.  Spending time with friends and family.  Minimize exposure to UV radiation to reduce your risk of skin cancer.  Safety  Always wear your seat belt while driving or riding in a vehicle.  Do not drive:  If you have been drinking alcohol. Do not ride with someone who has been drinking.  When you are tired or distracted.  While texting.  If you have been using any mind-altering substances or drugs.  Wear a helmet and other protective equipment during sports activities.  If you have firearms in your house, make sure you follow all gun safety procedures.  Seek help if you have been physically or sexually abused.  What's next?  Visit your health care provider once a year for an annual wellness visit.  Ask your health care provider how often you should have your eyes and teeth checked.  Stay up to date on all vaccines.  This information is not intended to replace advice given to you by your health care provider. Make sure you discuss any questions you have with your health care provider.  Document Revised: 01/25/2021 Document Reviewed: 01/25/2021  Elsevier Patient Education  2024 [employer].

## 2023-09-24 NOTE — Progress Notes (Signed)
GYNECOLOGY: ANNUAL EXAM   Subjective:    PCP: Eustaquio Boyden, MD GLORIANN RIEDE is a 43 y.o. female [phone] who presents for annual wellness visit. Requesting pap be updated today.   Well Woman Visit:  GYN HISTORY:  No LMP recorded. Patient has had an ablation.     Menstrual History: OB History     Gravida  7   Para  5   Term  1   Preterm      AB  1   Living  1      SAB  1   IAB  0   Ectopic      Multiple      Live Births  1           Menarche age: 60 No LMP recorded. Patient has had an ablation.    Urinary incontinence? yes  Sexually active: no Number of sexual partners: 0 Gender of sexual Partners: male Social History   Substance and Sexual Activity  Sexual Activity Yes   Birth control/protection: Surgical   Contraceptive methods: bilateral tubal ligation Dyspareunia? no STI history: no STI/HIV testing or immunizations needed? No.   Health Maintenance: -Last pap: approximate date 09/09/19 and was normal NILM & HPV negative --> Any abnormals: no -Last mammogram: ordered --> Any abnormals? N/a -Last colon cancer screen: n/a / Type: n/a -Last DEXA scan: n/a [facility] of Breast / Colon / Cervical cancer: yes colon  -Vaccines:  Immunization History  Administered Date(s) Administered   Influenza,inj,Quad PF,6+ Mos 09/02/2019   PFIZER(Purple Top)SARS-COV-2 Vaccination 10/09/2019, 10/14/2019, 11/13/2019, 10/11/2020   Pfizer Covid-19 Vaccine Bivalent Booster 25yrs & up 03/13/2021   Tdap 12/27/2017   Last Tdap: utd / Flu: utd / COVID: utd / Gardasil: declined  / Shingles (50+): n/a / PCV20: n/a -Hep C screen: completed -Last lipid / glucose screening: today  > Exercise: housecleaning, running/ jogging, walking, weightlifting, and yard work, moderately active > Dietary Supplements: Folate: No;  Calcium: yes}; Vitamin D: Yes > Body mass index is 24.03 kg/m.  > Recent dental visit Yes.   > Seat Belt Use: Yes.   > Texting and driving?  No. > Guns in the house Yes.   > Recreational or other drug use: denied.   Social History   Tobacco Use   Smoking status: Former    Current packs/day: 0.00    Average packs/day: 0.3 packs/day for 10.0 years (2.5 ttl pk-yrs)    Types: Cigarettes    Start date: 11/12/2006    Quit date: 11/11/2016    Years since quitting: 6.8   Smokeless tobacco: Never  Substance Use Topics   Alcohol use: No    Comment: socially   Occupation: cma    Lives with: son    PHQ-2 Score: In last two weeks, how often have you felt: Little interest or pleasure in doing things: Not at all (0) Feeling down, depressed or hopeless: Not at all (0) Score: 0  GAD-2 Over the last 2 weeks, how often have you been bothered by the following problems? Feeling nervous, anxious or on edge: Not at all (0) Not being able to stop or control worrying: Not at all (0)} Score: 0 _________________________________________________________  Current Outpatient Medications  Medication Sig Dispense Refill   botulinum toxin Type A (BOTOX) 100 units SOLR injection PROVIDER TO INJECT 155 UNITS INTRAMUSCULARLY INTO HEAD AND NECK EVERY 3 MONTHS. DISCARD REMAINDER. 2 each 0   cyclobenzaprine (FLEXERIL) 10 MG tablet Take 1 tablet (10 mg  total) by mouth at bedtime. 90 tablet 0   LORazepam (ATIVAN) 0.5 MG tablet Take 1 tablet (0.5 mg total) by mouth 2 (two) times daily as needed for anxiety. 30 tablet 0   Rimegepant Sulfate (NURTEC) 75 MG TBDP Take 1 tablet (75 mg total) by mouth daily as needed (migraine). 16 tablet 11   topiramate (TOPAMAX) 50 MG tablet Take 1 tablet (50 mg total) by mouth daily. 90 tablet 3   Current Facility-Administered Medications  Medication Dose Route Frequency Provider Last Rate Last Admin   botulinum toxin Type A (BOTOX) injection 155 Units  155 Units Intramuscular Once Anson Fret, MD       No Known Allergies  Past Medical History:  Diagnosis Date   Anxiety    Asthma    as a child   Migraine     Past Surgical History:  Procedure Laterality Date   BREAST BIOPSY Left 2012   x 2    BREAST BIOPSY Left 2018   BREAST REDUCTION SURGERY     ENDOMETRIAL ABLATION N/A 01/06/2015   Procedure: ENDOMETRIAL ABLATION;  Surgeon: Grant Bing, MD;  Location: [facility] ORS;  Service: Gynecology;  Laterality: N/A;   LAPAROSCOPIC BILATERAL SALPINGO OOPHERECTOMY Bilateral 01/06/2015   LAPAROSCOPIC BILATERAL TUBAL LIGATION with FILSIE CLIPS, novasure ablation, hysteroscopy for AUB [place] Bing, MD)   REDUCTION MAMMAPLASTY Bilateral 2020    Review Of Systems  Constitutional: Denied constitutional symptoms, night sweats, recent illness, fatigue, fever, insomnia and weight loss.  Eyes: Denied eye symptoms, eye pain, photophobia, vision change and visual disturbance.  Ears/Nose/Throat/Neck: Denied ear, nose, throat or neck symptoms, hearing loss, nasal discharge, sinus congestion and sore throat.  Cardiovascular: Denied cardiovascular symptoms, arrhythmia, chest pain/pressure, edema, exercise intolerance, orthopnea and palpitations.  Respiratory: Denied pulmonary symptoms, asthma, pleuritic pain, productive sputum, cough, dyspnea and wheezing.  Gastrointestinal: Denied, gastro-esophageal reflux, melena, nausea and vomiting.  Genitourinary: Denied genitourinary symptoms including symptomatic vaginal discharge, pelvic relaxation issues, and urinary complaints.  Musculoskeletal: Denied musculoskeletal symptoms, stiffness, swelling, muscle weakness and myalgia.  Dermatologic: Denied dermatology symptoms, rash and scar.  Neurologic: Denied neurology symptoms, dizziness, headache, neck pain and syncope.  Psychiatric: Denied psychiatric symptoms, anxiety and depression.  Endocrine: Denied endocrine symptoms including hot flashes and night sweats.      Objective:    BP 105/67   Pulse 69   Ht 5\' 4"  (1.626 m)   Wt 140 lb (63.5 kg)   BMI 24.03 kg/m   Constitutional: Well-developed, well-nourished  female in no acute distress Neurological: Alert and oriented to person, place, and time Psychiatric: Mood and affect appropriate Skin: No rashes or lesions Neck: Supple without masses. Trachea is midline.Thyroid is normal size without masses Lymphatics: No cervical, axillary, supraclavicular, or inguinal adenopathy noted Respiratory: Clear to auscultation bilaterally. Good air movement with normal work of breathing. Cardiovascular: Regular rate and rhythm. Extremities grossly normal, nontender with no edema; pulses regular Gastrointestinal: Soft, nontender, nondistended. No masses or hernias appreciated. No hepatosplenomegaly. No fluid wave. No rebound or guarding. Breast Exam: normal appearance, no masses or tenderness, Inspection negative, No nipple retraction or dimpling, No nipple discharge or bleeding, No axillary or supraclavicular adenopathy, Normal to palpation without dominant masses Genitourinary:         External Genitalia: Normal female genitalia    Vagina: Normal mucosa, no lesions.    Cervix: No lesions, normal size and consistency; no cervical motion tenderness; non-friable; Pap obtained.    Uterus: Normal size and contour; smooth, mobile, NT. Adnexae: Non-palpable  and non-tender Perineum/Anus: No lesions Rectal: deferred    Assessment/Plan:    SEMIYAH NEWGENT is a 42 y.o. female [id number] with normal well-woman gynecologic exam.  -Screenings:  Pap: done with cotesting today at pt's request Mammogram: ordered Labs: A1C, Lipid GAD/PHQ-2 = 0 -Contraception: s/p BTL -Vaccines: UTD -Healthy lifestyle modifications discussed: multivitamin, diet, exercise, sunscreen, tobacco and alcohol use. Emphasized importance of regular physical activity.  -Calcium and Vit D recommendation reviewed.  -All questions answered to patient's satisfaction.  -RTC 1 yr for annual, sooner prn.   Return in about 1 year (around 09/23/2024) for annual.    Julieanne Manson, DO [facility]  [facility]

## 2023-09-25 ENCOUNTER — Encounter: Payer: Self-pay | Admitting: Obstetrics

## 2023-09-25 ENCOUNTER — Other Ambulatory Visit (HOSPITAL_BASED_OUTPATIENT_CLINIC_OR_DEPARTMENT_OTHER): Payer: Self-pay

## 2023-09-25 LAB — LIPID PANEL
Chol/HDL Ratio: 4.1 {ratio} (ref 0.0–4.4)
Cholesterol, Total: 233 mg/dL — ABNORMAL HIGH (ref 100–199)
HDL: 57 mg/dL (ref >39)
LDL Chol Calc (NIH): 166 mg/dL — ABNORMAL HIGH (ref 0–99)
Triglycerides: 57 mg/dL (ref 0–149)
VLDL Cholesterol Cal: 10 mg/dL (ref 5–40)

## 2023-09-25 LAB — HIV ANTIBODY (ROUTINE TESTING W REFLEX): HIV Screen 4th Generation wRfx: NONREACTIVE

## 2023-09-25 LAB — HEMOGLOBIN A1C
Est. average glucose Bld gHb Est-mCnc: 88 mg/dL
Hgb A1c MFr Bld: 4.7 % — ABNORMAL LOW (ref 4.8–5.6)

## 2023-09-25 MED ORDER — TOPIRAMATE 100 MG PO TABS
100.0000 mg | ORAL_TABLET | Freq: Every day | ORAL | 3 refills | Status: DC
  Filled 2023-09-25: qty 90, 90d supply, fill #0

## 2023-09-25 NOTE — Progress Notes (Signed)
Pt asks for Topamax to go to [facility] instead of [facility]. Rx sent to [facility] @ [facility]. I called [facility] and canceled Topamax there.

## 2023-09-25 NOTE — Progress Notes (Unsigned)
[name] [address] [place] [address] Phone: [phone] Subjective:   Bruce Donath, am serving as a scribe for Dr. Antoine Primas.  I'm seeing this patient by the request  of:  Eustaquio Boyden, MD  CC: Neck pain, headache follow-up  BJY:[id number]  ALEANNA MENGE is a 43 y.o. female coming in with complaint of back and neck pain. OMT on 03/28/2023. Patient states that she is getting cupping for L side of C spine which she finds helpful. Feels tight and seems to be using more BC powders.   Medications patient has been prescribed:   Taking:         Reviewed prior external information including notes and imaging from previsou exam, outside providers and external EMR if available.   As well as notes that were available from care everywhere and other healthcare systems.  Past medical history, social, surgical and family history all reviewed in electronic medical record.  No pertanent information unless stated regarding to the chief complaint.   Past Medical History:  Diagnosis Date   Anxiety    Asthma    as a child   Migraine     No Known Allergies   Review of Systems:  No headache, visual changes, nausea, vomiting, diarrhea, constipation, dizziness, abdominal pain, skin rash, fevers, chills, night sweats, weight loss, swollen lymph nodes, body aches, joint swelling, chest pain, shortness of breath, mood changes. POSITIVE muscle aches  Objective  Blood pressure 118/72, pulse 73, height 5\' 4"  (1.626 m), weight 142 lb (64.4 kg), SpO2 98%.   General: No apparent distress alert and oriented x3 mood and affect normal, dressed appropriately.  HEENT: Pupils equal, extraocular movements intact  Respiratory: Patient's speak in full sentences and does not appear short of breath  Cardiovascular: No lower extremity edema, non tender, no erythema  Neck does have some loss lordosis noted.  Some tenderness to palpation in the paraspinal  musculature.  Patient does have tightness noted in the parascapular area as well as right greater than left.  Osteopathic findings  C2 flexed rotated and side bent right C6 flexed rotated and side bent left T3 extended rotated and side bent right inhaled rib T5 extended rotated and side bent right L2 flexed rotated and side bent right Sacrum right on right       Assessment and Plan:  Cervicogenic headache Cervicogenic.  Discussed which activities to do and which ones to avoid.  Increase activity slowly.  Discussed icing regimen.  Screening responds well to osteopathic intubation.  Discussed different medications could be beneficial.  Discussed Celebrex 200 mg up to twice a day to see if this would be any better.  Follow-up again in 6 to 8 weeks    Nonallopathic problems  Decision today to treat with OMT was based on Physical Exam  After verbal consent patient was treated with HVLA, ME, FPR techniques in cervical, rib, thoracic, lumbar, and sacral  areas  Patient tolerated the procedure well with improvement in symptoms  Patient given exercises, stretches and lifestyle modifications  See medications in patient instructions if given  Patient will follow up in 4-8 weeks     The above documentation has been reviewed and is accurate and complete Judi Saa, DO         Note: This dictation was prepared with Dragon dictation along with smaller phrase technology. Any transcriptional errors that result from this process are unintentional.

## 2023-09-25 NOTE — Addendum Note (Signed)
Addended by: Bertram Savin on: 09/25/2023 07:56 AM   Modules accepted: Orders

## 2023-09-27 LAB — CYTOLOGY - PAP
Adequacy: ABSENT
Chlamydia: NEGATIVE
Comment: NEGATIVE
Comment: NEGATIVE
Comment: NORMAL
Diagnosis: NEGATIVE
Diagnosis: REACTIVE
High risk HPV: NEGATIVE
Neisseria Gonorrhea: NEGATIVE

## 2023-10-01 ENCOUNTER — Telehealth: Payer: Self-pay | Admitting: Family Medicine

## 2023-10-01 ENCOUNTER — Ambulatory Visit (INDEPENDENT_AMBULATORY_CARE_PROVIDER_SITE_OTHER): Payer: 59 | Admitting: Family Medicine

## 2023-10-01 ENCOUNTER — Encounter: Payer: Self-pay | Admitting: Family Medicine

## 2023-10-01 ENCOUNTER — Other Ambulatory Visit (HOSPITAL_BASED_OUTPATIENT_CLINIC_OR_DEPARTMENT_OTHER): Payer: Self-pay

## 2023-10-01 VITALS — BP 118/72 | HR 73 | Ht 64.0 in | Wt 142.0 lb

## 2023-10-01 DIAGNOSIS — G4486 Cervicogenic headache: Secondary | ICD-10-CM | POA: Diagnosis not present

## 2023-10-01 DIAGNOSIS — M9903 Segmental and somatic dysfunction of lumbar region: Secondary | ICD-10-CM | POA: Diagnosis not present

## 2023-10-01 DIAGNOSIS — M9902 Segmental and somatic dysfunction of thoracic region: Secondary | ICD-10-CM | POA: Diagnosis not present

## 2023-10-01 DIAGNOSIS — M9908 Segmental and somatic dysfunction of rib cage: Secondary | ICD-10-CM | POA: Diagnosis not present

## 2023-10-01 DIAGNOSIS — M9904 Segmental and somatic dysfunction of sacral region: Secondary | ICD-10-CM

## 2023-10-01 DIAGNOSIS — M9901 Segmental and somatic dysfunction of cervical region: Secondary | ICD-10-CM | POA: Diagnosis not present

## 2023-10-01 MED ORDER — CELECOXIB 200 MG PO CAPS
200.0000 mg | ORAL_CAPSULE | Freq: Two times a day (BID) | ORAL | 1 refills | Status: DC
  Filled 2023-10-01: qty 60, 30d supply, fill #0

## 2023-10-01 NOTE — Telephone Encounter (Signed)
Copied from CRM [id number]. Topic: General - Other >> Oct 01, 2023  2:34 PM Adele Barthel wrote: Reason for CRM: Patient is calling in about her daughter being scheduled with Dr. Sharen Hones even though he is not accepting new patients. She has spoken with him through MyChart and he confirmed her daughter could be added to his schedule. Requested message be sent to provider to confirm his approval.   Patient's daughter's name is   Kirby Funk 07/23/2003  CB# [phone] (M)

## 2023-10-01 NOTE — Assessment & Plan Note (Signed)
Cervicogenic.  Discussed which activities to do and which ones to avoid.  Increase activity slowly.  Discussed icing regimen.  Screening responds well to osteopathic intubation.  Discussed different medications could be beneficial.  Discussed Celebrex 200 mg up to twice a day to see if this would be any better.  Follow-up again in 6 to 8 weeks

## 2023-10-01 NOTE — Patient Instructions (Signed)
Celebrex 200mg  2x a day as needed See me in 4-6 weeks

## 2023-10-02 ENCOUNTER — Encounter: Payer: Self-pay | Admitting: Obstetrics

## 2023-10-02 NOTE — Telephone Encounter (Addendum)
Please schedule appt for patient's daughter

## 2023-10-02 NOTE — Telephone Encounter (Signed)
 Ok to schedule.

## 2023-10-03 NOTE — Telephone Encounter (Signed)
Spoke to pt's mom, got pt's info to schedule appt. Will contact pt

## 2023-10-07 ENCOUNTER — Other Ambulatory Visit (HOSPITAL_BASED_OUTPATIENT_CLINIC_OR_DEPARTMENT_OTHER): Payer: Self-pay

## 2023-10-11 ENCOUNTER — Other Ambulatory Visit (HOSPITAL_BASED_OUTPATIENT_CLINIC_OR_DEPARTMENT_OTHER): Payer: Self-pay

## 2023-10-30 ENCOUNTER — Ambulatory Visit: Payer: 59 | Admitting: Family Medicine

## 2023-11-26 ENCOUNTER — Ambulatory Visit (INDEPENDENT_AMBULATORY_CARE_PROVIDER_SITE_OTHER): Payer: 59 | Admitting: Neurology

## 2023-11-26 ENCOUNTER — Other Ambulatory Visit (HOSPITAL_BASED_OUTPATIENT_CLINIC_OR_DEPARTMENT_OTHER): Payer: Self-pay

## 2023-11-26 DIAGNOSIS — G43711 Chronic migraine without aura, intractable, with status migrainosus: Secondary | ICD-10-CM | POA: Diagnosis not present

## 2023-11-26 DIAGNOSIS — G43009 Migraine without aura, not intractable, without status migrainosus: Secondary | ICD-10-CM

## 2023-11-26 MED ORDER — ONABOTULINUMTOXINA 100 UNITS IJ SOLR
100.0000 [IU] | Freq: Once | INTRAMUSCULAR | Status: AC
Start: 2023-11-26 — End: 2023-11-26
  Administered 2023-11-26: 155 [IU] via INTRAMUSCULAR

## 2023-11-26 MED ORDER — NURTEC 75 MG PO TBDP
75.0000 mg | ORAL_TABLET | Freq: Every day | ORAL | 11 refills | Status: DC | PRN
  Filled 2023-11-26: qty 16, 28d supply, fill #0
  Filled 2024-01-27 – 2024-02-25 (×2): qty 16, 28d supply, fill #1
  Filled 2024-07-15: qty 16, 28d supply, fill #2
  Filled 2024-08-02 – 2024-08-07 (×2): qty 16, 28d supply, fill #3

## 2023-11-26 MED ORDER — TOPIRAMATE 100 MG PO TABS
100.0000 mg | ORAL_TABLET | Freq: Every day | ORAL | 3 refills | Status: DC
Start: 2023-11-26 — End: 2024-06-09
  Filled 2023-11-26 – 2024-01-27 (×3): qty 90, 90d supply, fill #0

## 2023-11-26 MED ORDER — ONABOTULINUMTOXINA 100 UNITS IJ SOLR
100.0000 [IU] | Freq: Once | INTRAMUSCULAR | Status: DC
Start: 2023-11-26 — End: 2023-11-26

## 2023-11-26 NOTE — Progress Notes (Signed)
 Botox- 100 units x 2 vial Lot: [id number] Expiration: 12/2025 NDC: 9509-3267-12  Bacteriostatic 0.9% Sodium Chloride- 30mL total Lot: [id number] Expiration: 06/13/2024 NDC: 8338-2505-39  Dx: J67.341 B/B Witnessed by Lissa Riding, RN

## 2023-11-26 NOTE — Progress Notes (Signed)
 Consent Form 11/26/2023: 4 migraine days a month and < 6 total headache days a month continue nurtec, start topiramate again bc summer is her worst time.Failed imitrex, maxalt   05/16/2023: Doing great, She has > 70% improvement in migraine frequency and severity 02/24/2023: Have recommended MRI of the brain in the past. She would like to go forward with it due to concerning symptoms including: MRI brain due to concerning symptoms of abnormal white matter changes to follow for progression and blurry vision - to look for vasculidities, demyelination(multiple sclerosis) or other brain - MS protocol. stable with migraines still doing very well. She has > 50% improvement in migraine frequency and severity     Botulism Toxin Injection For Chronic Migraine 11/20/2022: Stable 08/23/2022: Stable still doing very well. She has > 50% improvement in migraine frequency and severity. Now with botox she has 4 migraine days a month and < 8 total headache days a month.   05/23/2022: Stable still doing very well. She has > 50% improvement in migraine frequency and severity. Now with botox she has 4 migraine days a month and < 8 total headache days a month.     719/2023: going great, stable, >> 50% decrease in freq and severity of migraines and headaches.   4/5/2023[name] 08/23/2021: doing great as far migraines go but she continues to have cervical myofascial pain, musculoskletal neck pain, has tried dry needling, PT, muscle relaxers. Refer to specialist.. MRI cervical spine 2018 was normal. Personally reviewed images with patient (additional 10 minutes on personal review on CD in our office if no improvement recommend repeat MRI cervical spine worsening pain, decreased ROM, crepitus) MRI c-spine 2018: :  Metal artifact slightly obscures the upper anterior spine. On sagittal images, the spine is imaged from above the cervicomedullary junction to T2.   The spinal cord is of normal caliber and signal.    There is some straightening of the cervical curvature. No spondylolisthesis is noted..   The vertebral bodies have normal signal.  The discs and interspaces were further evaluated on axial views from C2 to T1.   The discs were normal at each of the cervical levels. There was no spondylosis. The neural foramina are widely patent at every level and there is no nerve root compression.After the infusion of contrast, a normal enhancement pattern was observed IMPRESSION:  This is a normal MRI of the cervical spine with and without contrast.  No orders of the defined types were placed in this encounter.  I spent 20 minutes of face-to-face and non-face-to-face time with patient on the  1. Chronic migraine without aura, with intractable migraine, so stated, with status migrainosus   2. Migraine without aura and without status migrainosus, not intractable      diagnosis.  This included previsit chart review, lab review, study review, order entry, electronic health record documentation, patient education on the different diagnostic and therapeutic options, counseling and coordination of care, risks and benefits of management, compliance, or risk factor reduction. This does not include time spent on botox.    05/27/2021: Stable, still excellent response, > 80% improvement with freg of migraines   Reviewed orally with patient, additionally signature is on file:  Botulism toxin has been approved by the Federal drug administration for treatment of chronic migraine. Botulism toxin does not cure chronic migraine and it may not be effective in some patients.  The administration of botulism toxin is accomplished by injecting a small amount of toxin into the muscles  of the neck and head. Dosage must be titrated for each individual. Any benefits resulting from botulism toxin tend to wear off after 3 months with a repeat injection required if benefit is to be maintained. Injections are usually done every 3-4 months  with maximum effect peak achieved by about 2 or 3 weeks. Botulism toxin is expensive and you should be sure of what costs you will incur resulting from the injection.  The side effects of botulism toxin use for chronic migraine may include:   -Transient, and usually mild, facial weakness with facial injections  -Transient, and usually mild, head or neck weakness with head/neck injections  -Reduction or loss of forehead facial animation due to forehead muscle weakness  -Eyelid drooping  -Dry eye  -Pain at the site of injection or bruising at the site of injection  -Double vision  -Potential unknown long term risks  Contraindications: You should not have Botox if you are pregnant, nursing, allergic to albumin, have an infection, skin condition, or muscle weakness at the site of the injection, or have myasthenia gravis, Lambert-Eaton syndrome, or ALS.  It is also possible that as with any injection, there may be an allergic reaction or no effect from the medication. Reduced effectiveness after repeated injections is sometimes seen and rarely infection at the injection site may occur. All care will be taken to prevent these side effects. If therapy is given over a long time, atrophy and wasting in the muscle injected may occur. Occasionally the patient's become refractory to treatment because they develop antibodies to the toxin. In this event, therapy needs to be modified.  I have read the above information and consent to the administration of botulism toxin.    BOTOX PROCEDURE NOTE FOR MIGRAINE HEADACHE    Contraindications and precautions discussed with patient(above). Aseptic procedure was observed and patient tolerated procedure. Procedure performed by Dr. Criselda Dolly  The condition has existed for more than 6 months, and pt does not have a diagnosis of ALS, Myasthenia Gravis or Lambert-Eaton Syndrome.  Risks and benefits of injections discussed and pt agrees to proceed with the procedure.   Written consent obtained  These injections are medically necessary. Pt  receives good benefits from these injections. These injections do not cause sedations or hallucinations which the oral therapies may cause.  Description of procedure:  The patient was placed in a sitting position. The standard protocol was used for Botox as follows, with 5 units of Botox injected at each site:   -Procerus muscle, midline injection  -Corrugator muscle, bilateral injection  -Frontalis muscle, bilateral injection, with 2 sites each side, medial injection was performed in the upper one third of the frontalis muscle, in the region vertical from the medial inferior edge of the superior orbital rim. The lateral injection was again in the upper one third of the forehead vertically above the lateral limbus of the cornea, 1.5 cm lateral to the medial injection site.  - Levator Scapulae: 5 units bilaterally  -Temporalis muscle injection, 5 sites, bilaterally. The first injection was 3 cm above the tragus of the ear, second injection site was 1.5 cm to 3 cm up from the first injection site in line with the tragus of the ear. The third injection site was 1.5-3 cm forward between the first 2 injection sites. The fourth injection site was 1.5 cm posterior to the second injection site. 5th site laterally in the temporalis  muscleat the level of the outer canthus.  - Patient feels her clenching  is a trigger for headaches. +5 units masseter bilaterally   - Patient feels the migraines are centered around the eyes +5 units bilaterally at the outer canthus in the orbicularis occuli  -Occipitalis muscle injection, 3 sites, bilaterally. The first injection was done one half way between the occipital protuberance and the tip of the mastoid process behind the ear. The second injection site was done lateral and superior to the first, 1 fingerbreadth from the first injection. The third injection site was 1 fingerbreadth superiorly  and medially from the first injection site.  -Cervical paraspinal muscle injection, 2 sites, bilateral knee first injection site was 1 cm from the midline of the cervical spine, 3 cm inferior to the lower border of the occipital protuberance. The second injection site was 1.5 cm superiorly and laterally to the first injection site.  -Trapezius muscle injection was performed at 3 sites, bilaterally. The first injection site was in the upper trapezius muscle halfway between the inflection point of the neck, and the acromion. The second injection site was one half way between the acromion and the first injection site. The third injection was done between the first injection site and the inflection point of the neck.   Will return for repeat injection in 3 months.   155 unit sof Botox was used, 45U Botox not injected was wasted. The patient tolerated the procedure well, there were no complications of the above procedure.

## 2023-12-10 ENCOUNTER — Other Ambulatory Visit (HOSPITAL_BASED_OUTPATIENT_CLINIC_OR_DEPARTMENT_OTHER): Payer: Self-pay

## 2023-12-10 ENCOUNTER — Ambulatory Visit: Admitting: Family Medicine

## 2023-12-10 ENCOUNTER — Telehealth (INDEPENDENT_AMBULATORY_CARE_PROVIDER_SITE_OTHER): Admitting: Family Medicine

## 2023-12-10 ENCOUNTER — Encounter: Payer: Self-pay | Admitting: Family Medicine

## 2023-12-10 VITALS — Temp 99.5°F | Ht 64.0 in | Wt 142.0 lb

## 2023-12-10 DIAGNOSIS — J069 Acute upper respiratory infection, unspecified: Secondary | ICD-10-CM | POA: Diagnosis not present

## 2023-12-10 MED ORDER — PREDNISONE 10 MG PO TABS
ORAL_TABLET | ORAL | 0 refills | Status: AC
Start: 2023-12-10 — End: 2023-12-16
  Filled 2023-12-10: qty 21, 6d supply, fill #0

## 2023-12-10 NOTE — Progress Notes (Signed)
 Virtual Visit via Video Note  I connected with Michelle Rhodes on 12/10/23 at 11:57 AM by a video enabled telemedicine application and verified that I am speaking with the correct person using two identifiers.   Patient Location: Home Provider Location: office - [place].    I discussed the limitations, risks, security and privacy concerns of performing an evaluation and management service by telephone and the availability of in person appointments. I also discussed with the patient that there may be a patient responsible charge related to this service. The patient expressed understanding and agreed to proceed, consent obtained  Chief Complaint  Patient presents with   Fever   Fatigue   Nasal Congestion   Sore Throat    History of Present Illness: Michelle Rhodes is a 43 y.o. female with acute visit.   Patient started with chills, fever on Sunday while riding back from the beach.  Having low grade fever (101 oral) Fatigue Nasal Congestion Headache Sore Throat Lymph nodes swollen  Cough at night- dry  Denies ear pain/drainage, CP, SHOB, nausea, or vomiting.   Been exposed to covid on Easter Sunday by family member.   Tested yesterday at another doctors office for covid, flu, and RSV-negative.   Took another at home covid test today-negative.   She has took Ibuprofen , Tylenol , and Mucinex.   Patient Active Problem List   Diagnosis Date Noted   Flushing 04/19/2023   Health maintenance examination 04/19/2023   Positive ANA (antinuclear antibody) 04/19/2023   Chronic insomnia 04/19/2023   GAD (generalized anxiety disorder) 01/22/2023   Trigger point of shoulder region 04/06/2022   Cervicogenic headache 08/28/2021   Somatic dysfunction of spine, cervical 08/28/2021   Mixed urge and stress incontinence 11/01/2020   White matter abnormality on MRI of brain 01/14/2015   CTS (carpal tunnel syndrome) 01/13/2015   Chronic migraine without aura, with intractable migraine, so  stated, with status migrainosus 01/13/2015   Past Medical History:  Diagnosis Date   Anxiety    Asthma    as a child   Migraine    Past Surgical History:  Procedure Laterality Date   BREAST BIOPSY Left 2012   x 2    BREAST BIOPSY Left 2018   BREAST REDUCTION SURGERY     ENDOMETRIAL ABLATION N/A 01/06/2015   Procedure: ENDOMETRIAL ABLATION;  Surgeon: Raynell Caller, MD;  Location: [facility] ORS;  Service: Gynecology;  Laterality: N/A;   LAPAROSCOPIC BILATERAL SALPINGO OOPHERECTOMY Bilateral 01/06/2015   LAPAROSCOPIC BILATERAL TUBAL LIGATION with FILSIE CLIPS, novasure ablation, hysteroscopy for AUB Raynell Caller, MD)   REDUCTION MAMMAPLASTY Bilateral 2020   No Known Allergies Prior to Admission medications   Medication Sig Start Date End Date Taking? Authorizing Provider  botulinum toxin Type A  (BOTOX ) 100 units SOLR injection PROVIDER TO INJECT 155 UNITS INTRAMUSCULARLY INTO HEAD AND NECK EVERY 3 MONTHS. DISCARD REMAINDER. 10/07/19  Yes Glory Larsen, MD  cyclobenzaprine  (FLEXERIL ) 10 MG tablet Take 1 tablet (10 mg total) by mouth at bedtime. 05/20/23  Yes Dohmeier, Raoul Byes, MD  Rimegepant Sulfate  (NURTEC) 75 MG TBDP Take 1 tablet (75 mg total) by mouth daily as needed (migraine) 11/26/23  Yes Glory Larsen, MD  topiramate  (TOPAMAX ) 100 MG tablet Take 1 tablet (100 mg total) by mouth at bedtime. 11/26/23  Yes Glory Larsen, MD   Social History   Socioeconomic History   Marital status: Single    Spouse name: Not on file   Number of children: 1   Years of  education: 12   Highest education level: Associate degree: academic program  Occupational History   Not on file  Tobacco Use   Smoking status: Former    Current packs/day: 0.00    Average packs/day: 0.3 packs/day for 10.0 years (2.5 ttl pk-yrs)    Types: Cigarettes    Start date: 11/12/2006    Quit date: 11/11/2016    Years since quitting: 7.0   Smokeless tobacco: Never  Vaping Use   Vaping status: Every Day  Substance  and Sexual Activity   Alcohol use: No    Comment: socially   Drug use: No   Sexual activity: Yes    Birth control/protection: Surgical  Other Topics Concern   Not on file  Social History Narrative   Right handed   Lives at home with son, daughter and longterm partner.   Caffeine use: tea occasionally with dinner   Occ: [employer] C[employer] and [employer] on weekends    Activity: goes to gym 3d/wk   Diet: good water, fruits/vegetables daily    Social Drivers of Corporate investment banker Strain: Low Risk  (01/14/2023)   Overall Financial Resource Strain (CARDIA)    Difficulty of Paying Living Expenses: Not very hard  Food Insecurity: No Food Insecurity (01/14/2023)   Hunger Vital Sign    Worried About Running Out of Food in the Last Year: Never true    Ran Out of Food in the Last Year: Never true  Transportation Needs: No Transportation Needs (01/14/2023)   PRAPARE - Administrator, Civil Service (Medical): No    Lack of Transportation (Non-Medical): No  Physical Activity: Sufficiently Active (01/14/2023)   Exercise Vital Sign    Days of Exercise per Week: 5 days    Minutes of Exercise per Session: 60 min  Stress: Stress Concern Present (01/14/2023)   [employer] of Occupational Health - Occupational Stress Questionnaire    Feeling of Stress : Very much  Social Connections: Socially Integrated (01/14/2023)   Social Connection and Isolation Panel [NHANES]    Frequency of Communication with Friends and Family: More than three times a week    Frequency of Social Gatherings with Friends and Family: Never    Attends Religious Services: More than 4 times per year    Active Member of [employer] or Organizations: Yes    Attends Engineer, structural: More than 4 times per year    Marital Status: Living with partner  Intimate Partner Violence: Not on file    Observations/Objective: Today's Vitals   12/10/23 1136  Temp: 99.5 F (37.5 C)  TempSrc: Oral  Weight: 142 lb  (64.4 kg)  Height: 5\' 4"  (1.626 m)  Unable to obtain vital signs due to no access to equipment.  Physical Exam Vitals reviewed.  Constitutional:      General: She is not in acute distress.    Appearance: Normal appearance. She is not ill-appearing (Mild), toxic-appearing or diaphoretic.  HENT:     Head: Normocephalic and atraumatic.  Eyes:     General:        Right eye: No discharge.        Left eye: No discharge.     Conjunctiva/sclera: Conjunctivae normal.  Pulmonary:     Effort: Pulmonary effort is normal. No respiratory distress.     Breath sounds: Normal breath sounds.  Musculoskeletal:        General: Normal range of motion.  Skin:    General: Skin is warm and  dry.  Neurological:     General: No focal deficit present.     Mental Status: She is alert and oriented to person, place, and time. Mental status is at baseline.  Psychiatric:        Mood and Affect: Mood normal.        Behavior: Behavior normal.        Thought Content: Thought content normal.        Judgment: Judgment normal.    Assessment and Plan: Upper respiratory tract infection, unspecified type -     predniSONE ; Take 6 tablets (60 mg total) by mouth daily with breakfast for 1 day, THEN 5 tablets (50 mg total) daily with breakfast for 1 day, THEN 4 tablets (40 mg total) daily with breakfast for 1 day, THEN 3 tablets (30 mg total) daily with breakfast for 1 day, THEN 2 tablets (20 mg total) daily with breakfast for 1 day, THEN 1 tablet (10 mg total) daily with breakfast for 1 day.  Dispense: 21 tablet; Refill: 0  -Prescribed Prednisone  10mg , 6 day taper to help with symptoms.  -Rest, hydrate.  -Continue OTC medications that she has been taking.  -Work note excuse.   Follow Up Instructions: Follow up if not improved.    I discussed the assessment and treatment plan with the patient. The patient was provided an opportunity to ask questions and all were answered. The patient agreed with the plan and  demonstrated an understanding of the instructions.   The patient was advised to call back or seek an in-person evaluation if the symptoms worsen or if the condition fails to improve as anticipated.  Marcea Rojek, NP

## 2023-12-24 ENCOUNTER — Other Ambulatory Visit (HOSPITAL_BASED_OUTPATIENT_CLINIC_OR_DEPARTMENT_OTHER): Payer: Self-pay

## 2024-01-03 ENCOUNTER — Other Ambulatory Visit (HOSPITAL_BASED_OUTPATIENT_CLINIC_OR_DEPARTMENT_OTHER): Payer: Self-pay

## 2024-01-08 ENCOUNTER — Ambulatory Visit: Admitting: Family Medicine

## 2024-01-09 NOTE — Progress Notes (Deleted)
    GYNECOLOGY PROGRESS NOTE  Subjective:    Patient ID: MALAYZIA LAFORTE, female    DOB: 09-04-1980, 43 y.o.   MRN: [id number]  HPI  Patient is a 43 y.o. G11P1011 female who presents for STD Screening.  The following portions of the patient's history were reviewed and updated as appropriate: allergies, current medications, past family history, past medical history, past social history, past surgical history, and problem list.  Review of Systems Pertinent items are noted in HPI.   Objective:   There were no vitals taken for this visit. There is no height or weight on file to calculate BMI. General appearance: alert, cooperative, and no distress Abdomen: {abdominal exam:16834} Pelvic: {pelvic exam:16852::"cervix normal in appearance","external genitalia normal","no adnexal masses or tenderness","no cervical motion tenderness","rectovaginal septum normal","uterus normal size, shape, and consistency","vagina normal without discharge"} Extremities: {extremity exam:5109} Neurologic: {neuro exam:17854}   Assessment:   1. Screen for STD (sexually transmitted disease)      Plan:   1. Screen for STD (sexually transmitted disease) (Primary) ***       Donato Fu, CNM [facility]

## 2024-01-10 ENCOUNTER — Ambulatory Visit: Admitting: Certified Nurse Midwife

## 2024-01-10 ENCOUNTER — Encounter: Payer: Self-pay | Admitting: Family Medicine

## 2024-01-10 DIAGNOSIS — Z113 Encounter for screening for infections with a predominantly sexual mode of transmission: Secondary | ICD-10-CM

## 2024-01-13 ENCOUNTER — Other Ambulatory Visit

## 2024-01-13 DIAGNOSIS — Z113 Encounter for screening for infections with a predominantly sexual mode of transmission: Secondary | ICD-10-CM

## 2024-01-14 LAB — C. TRACHOMATIS/N. GONORRHOEAE RNA
C. trachomatis RNA, TMA: NOT DETECTED
N. gonorrhoeae RNA, TMA: NOT DETECTED

## 2024-01-14 LAB — HEPATITIS C ANTIBODY: Hepatitis C Ab: NONREACTIVE

## 2024-01-14 LAB — RPR: RPR Ser Ql: NONREACTIVE

## 2024-01-14 LAB — HIV ANTIBODY (ROUTINE TESTING W REFLEX): HIV 1&2 Ab, 4th Generation: NONREACTIVE

## 2024-01-15 ENCOUNTER — Ambulatory Visit: Payer: Self-pay | Admitting: Family Medicine

## 2024-01-22 NOTE — Telephone Encounter (Signed)
 Submitted auth renewal request to [employer], status is pending with reference # [id number].

## 2024-01-23 ENCOUNTER — Telehealth: Payer: Self-pay

## 2024-01-23 NOTE — Telephone Encounter (Signed)
 Auth#: [id number] (01/22/24-01/20/25)

## 2024-01-23 NOTE — Telephone Encounter (Signed)
 Received form from [employer] for FMLA. Sent mychart message to patient regarding form fee.

## 2024-01-27 ENCOUNTER — Other Ambulatory Visit (HOSPITAL_BASED_OUTPATIENT_CLINIC_OR_DEPARTMENT_OTHER): Payer: Self-pay

## 2024-01-27 ENCOUNTER — Encounter: Payer: Self-pay | Admitting: Family Medicine

## 2024-01-27 ENCOUNTER — Ambulatory Visit (INDEPENDENT_AMBULATORY_CARE_PROVIDER_SITE_OTHER): Admitting: Family Medicine

## 2024-01-27 VITALS — BP 110/60 | HR 77 | Temp 97.7°F | Wt 136.0 lb

## 2024-01-27 DIAGNOSIS — R233 Spontaneous ecchymoses: Secondary | ICD-10-CM

## 2024-01-27 DIAGNOSIS — Z0289 Encounter for other administrative examinations: Secondary | ICD-10-CM

## 2024-01-27 LAB — CBC WITH DIFFERENTIAL/PLATELET
Basophils Absolute: 0.1 10*3/uL (ref 0.0–0.1)
Basophils Relative: 1.5 % (ref 0.0–3.0)
Eosinophils Absolute: 0 10*3/uL (ref 0.0–0.7)
Eosinophils Relative: 1 % (ref 0.0–5.0)
HCT: 39.4 % (ref 36.0–46.0)
Hemoglobin: 13.5 g/dL (ref 12.0–15.0)
Lymphocytes Relative: 26.3 % (ref 12.0–46.0)
Lymphs Abs: 1.1 10*3/uL (ref 0.7–4.0)
MCHC: 34.1 g/dL (ref 30.0–36.0)
MCV: 97.4 fl (ref 78.0–100.0)
Monocytes Absolute: 0.3 10*3/uL (ref 0.1–1.0)
Monocytes Relative: 8.3 % (ref 3.0–12.0)
Neutro Abs: 2.7 10*3/uL (ref 1.4–7.7)
Neutrophils Relative %: 62.9 % (ref 43.0–77.0)
Platelets: 344 10*3/uL (ref 150.0–400.0)
RBC: 4.05 Mil/uL (ref 3.87–5.11)
RDW: 13.2 % (ref 11.5–15.5)
WBC: 4.2 10*3/uL (ref 4.0–10.5)

## 2024-01-27 LAB — TSH: TSH: 1.64 u[IU]/mL (ref 0.35–5.50)

## 2024-01-27 LAB — BASIC METABOLIC PANEL WITH GFR
BUN: 12 mg/dL (ref 6–23)
CO2: 27 meq/L (ref 19–32)
Calcium: 9.7 mg/dL (ref 8.4–10.5)
Chloride: 103 meq/L (ref 96–112)
Creatinine, Ser: 0.7 mg/dL (ref 0.40–1.20)
GFR: 107.33 mL/min (ref >60.00)
Glucose, Bld: 85 mg/dL (ref 70–99)
Potassium: 4.2 meq/L (ref 3.5–5.1)
Sodium: 137 meq/L (ref 135–145)

## 2024-01-27 LAB — HEPATIC FUNCTION PANEL
ALT: 9 U/L (ref 0–35)
AST: 15 U/L (ref 0–37)
Albumin: 4.5 g/dL (ref 3.5–5.2)
Alkaline Phosphatase: 32 U/L — ABNORMAL LOW (ref 39–117)
Bilirubin, Direct: 0.1 mg/dL (ref 0.0–0.3)
Total Bilirubin: 0.3 mg/dL (ref 0.2–1.2)
Total Protein: 6.8 g/dL (ref 6.0–8.3)

## 2024-01-27 LAB — PROTIME-INR
INR: 1.2 ratio — ABNORMAL HIGH (ref 0.8–1.0)
Prothrombin Time: 12.2 s (ref 9.6–13.1)

## 2024-01-27 NOTE — Progress Notes (Signed)
   Subjective:    Patient ID: Michelle Rhodes, female    DOB: [birth year], 43 y.o.   MRN: [id number]  HPI Bruising- pt reports bruising on inner thighs starting 3 weeks ago.  Today noticed a painful bruise on R forearm that was not there yesterday.  Thigh bruising was not painful.  Last week had migraine that started Tuesday afternoon.  Started vomiting overnight.  Friday got a shot for HA.  Pt has been taking quite a bit of BC's due to HA's.  No unexplained fevers.  Some recent night sweats.  No unexplained weight loss.   Review of Systems For ROS see HPI     Objective:   Physical Exam Vitals reviewed.  Constitutional:      General: She is not in acute distress.    Appearance: Normal appearance. She is not ill-appearing.  HENT:     Head: Normocephalic and atraumatic.   Eyes:     Extraocular Movements: Extraocular movements intact.     Conjunctiva/sclera: Conjunctivae normal.    Cardiovascular:     Rate and Rhythm: Normal rate and regular rhythm.  Pulmonary:     Effort: Pulmonary effort is normal. No respiratory distress.   Musculoskeletal:     Cervical back: Neck supple.  Lymphadenopathy:     Head:     Right side of head: No submental, submandibular, tonsillar, preauricular, posterior auricular or occipital adenopathy.     Left side of head: No submental, submandibular, tonsillar, preauricular, posterior auricular or occipital adenopathy.     Cervical: No cervical adenopathy.     Upper Body:     Right upper body: No supraclavicular or epitrochlear adenopathy.     Left upper body: No supraclavicular or epitrochlear adenopathy.   Skin:    General: Skin is warm and dry.     Findings: Bruising (oval bruise on R inner forearm) present.   Neurological:     General: No focal deficit present.     Mental Status: She is alert and oriented to person, place, and time.   Psychiatric:        Mood and Affect: Mood normal.        Behavior: Behavior normal.        Thought Content:  Thought content normal.           Assessment & Plan:  Abnormal bruising- new.  Suspect this may be related to her increased use of BC powder recently but given her increased bruising, recent increase in HA's, and occasional night sweats will get labs to r/o underlying cause of bleeding.  Pt expressed understanding and is in agreement w/ plan.

## 2024-01-28 ENCOUNTER — Ambulatory Visit: Payer: Self-pay | Admitting: Family Medicine

## 2024-01-28 LAB — IRON,TIBC AND FERRITIN PANEL
%SAT: 36 % (ref 16–45)
Ferritin: 30 ng/mL (ref 16–232)
Iron: 104 ug/dL (ref 40–190)
TIBC: 292 ug/dL (ref 250–450)

## 2024-01-30 NOTE — Telephone Encounter (Signed)
 FMLA form completed for chronic migraines.  Last seen 11-2023 for botox . Seen since 2016.  To Dr Tresia Fruit for signature after review.

## 2024-02-04 NOTE — Telephone Encounter (Signed)
 Signed back to Medical Records.

## 2024-02-07 ENCOUNTER — Other Ambulatory Visit (HOSPITAL_BASED_OUTPATIENT_CLINIC_OR_DEPARTMENT_OTHER): Payer: Self-pay

## 2024-02-10 ENCOUNTER — Encounter: Payer: Self-pay | Admitting: Family Medicine

## 2024-02-12 ENCOUNTER — Other Ambulatory Visit (HOSPITAL_BASED_OUTPATIENT_CLINIC_OR_DEPARTMENT_OTHER): Payer: Self-pay

## 2024-02-12 MED ORDER — SCOPOLAMINE 1 MG/3DAYS TD PT72
1.0000 | MEDICATED_PATCH | TRANSDERMAL | 0 refills | Status: DC
  Filled 2024-02-12: qty 4, 12d supply, fill #0

## 2024-02-12 MED ORDER — LORAZEPAM 0.5 MG PO TABS
0.5000 mg | ORAL_TABLET | Freq: Two times a day (BID) | ORAL | 0 refills | Status: DC | PRN
  Filled 2024-02-12: qty 10, 5d supply, fill #0

## 2024-02-13 ENCOUNTER — Other Ambulatory Visit (HOSPITAL_BASED_OUTPATIENT_CLINIC_OR_DEPARTMENT_OTHER): Payer: Self-pay

## 2024-02-13 MED ORDER — ONDANSETRON HCL 4 MG PO TABS
4.0000 mg | ORAL_TABLET | Freq: Three times a day (TID) | ORAL | 0 refills | Status: DC | PRN
  Filled 2024-02-13: qty 15, 5d supply, fill #0

## 2024-02-13 MED ORDER — ALPRAZOLAM 0.5 MG PO TABS
0.2500 mg | ORAL_TABLET | Freq: Two times a day (BID) | ORAL | 0 refills | Status: DC | PRN
  Filled 2024-02-13: qty 10, 5d supply, fill #0

## 2024-02-13 NOTE — Addendum Note (Signed)
 Addended by: RILLA BALLER on: 02/13/2024 12:31 PM   Modules accepted: Orders

## 2024-02-18 ENCOUNTER — Ambulatory Visit (INDEPENDENT_AMBULATORY_CARE_PROVIDER_SITE_OTHER): Admitting: Neurology

## 2024-02-18 ENCOUNTER — Other Ambulatory Visit (HOSPITAL_BASED_OUTPATIENT_CLINIC_OR_DEPARTMENT_OTHER): Payer: Self-pay

## 2024-02-18 VITALS — BP 101/60 | HR 67 | Ht 63.0 in | Wt 138.0 lb

## 2024-02-18 DIAGNOSIS — G43711 Chronic migraine without aura, intractable, with status migrainosus: Secondary | ICD-10-CM

## 2024-02-18 MED ORDER — ONABOTULINUMTOXINA 200 UNITS IJ SOLR
155.0000 [IU] | Freq: Once | INTRAMUSCULAR | Status: AC
  Administered 2024-02-18: 155 [IU] via INTRAMUSCULAR

## 2024-02-18 MED ORDER — SUMATRIPTAN SUCCINATE 6 MG/0.5ML ~~LOC~~ SOLN
6.0000 mg | SUBCUTANEOUS | 11 refills | Status: AC | PRN
  Filled 2024-02-18: qty 5, 30d supply, fill #0
  Filled 2024-06-10: qty 5, 30d supply, fill #1

## 2024-02-18 NOTE — Progress Notes (Signed)
 Botox - 200 units x 1 vial Lot: [id number] Expiration: 05/2026 NDC: 9976-6078-97  Bacteriostatic 0.9% Sodium Chloride - 4 mL  Lot: [id number] Expiration: 06/12/2025 NDC: 9590-8033-97  Dx: H56.288 B/B Witnessed by Nena GRADE RN

## 2024-02-18 NOTE — Progress Notes (Signed)
 Consent Form 02/18/2024: Doing great, She has > 70% improvement in migraine frequency and severity. 4 migraine days a month and < 6 total headache days a month continue nurtec, start topiramate  again bc summer is her worst time.Failed imitrex , maxalt . Patient feels that her migraines cause her jaw to ache in her jaw aching also can cause her migraines to worsen it is a trigger for migraines included 10 units in each masseter to see if that helps with migraine severity.  11/26/2023: Doing great, She has > 70% improvement in migraine frequency and severity. 4 migraine days a month and < 6 total headache days a month continue nurtec, start topiramate  again bc summer is her worst time.Failed imitrex , maxalt    05/16/2023: Doing great, She has > 70% improvement in migraine frequency and severity 02/24/2023: Have recommended MRI of the brain in the past. She would like to go forward with it due to concerning symptoms including: MRI brain due to concerning symptoms of abnormal white matter changes to follow for progression and blurry vision - to look for vasculidities, demyelination(multiple sclerosis) or other brain - MS protocol. stable with migraines still doing very well. She has > 50% improvement in migraine frequency and severity     Botulism Toxin Injection For Chronic Migraine 11/20/2022: Stable 08/23/2022: Stable still doing very well. She has > 50% improvement in migraine frequency and severity. Now with botox  she has 4 migraine days a month and < 8 total headache days a month.   05/23/2022: Stable still doing very well. She has > 50% improvement in migraine frequency and severity. Now with botox  she has 4 migraine days a month and < 8 total headache days a month.     719/2023: going great, stable, >> 50% decrease in freq and severity of migraines and headaches.   4/5/2023[name] 08/23/2021: doing great as far migraines go but she continues to have cervical myofascial pain, musculoskletal  neck pain, has tried dry needling, PT, muscle relaxers. Refer to specialist.. MRI cervical spine 2018 was normal. Personally reviewed images with patient (additional 10 minutes on personal review on CD in our office if no improvement recommend repeat MRI cervical spine worsening pain, decreased ROM, crepitus) MRI c-spine 2018: :  Metal artifact slightly obscures the upper anterior spine. On sagittal images, the spine is imaged from above the cervicomedullary junction to T2.   The spinal cord is of normal caliber and signal.   There is some straightening of the cervical curvature. No spondylolisthesis is noted..   The vertebral bodies have normal signal.  The discs and interspaces were further evaluated on axial views from C2 to T1.   The discs were normal at each of the cervical levels. There was no spondylosis. The neural foramina are widely patent at every level and there is no nerve root compression.After the infusion of contrast, a normal enhancement pattern was observed IMPRESSION:  This is a normal MRI of the cervical spine with and without contrast.  No orders of the defined types were placed in this encounter.  I spent 20 minutes of face-to-face and non-face-to-face time with patient on the  1. Chronic migraine without aura, with intractable migraine, so stated, with status migrainosus      diagnosis.  This included previsit chart review, lab review, study review, order entry, electronic health record documentation, patient education on the different diagnostic and therapeutic options, counseling and coordination of care, risks and benefits of management, compliance, or risk factor reduction. This does not  include time spent on botox .    05/27/2021: Stable, still excellent response, > 80% improvement with freg of migraines   Reviewed orally with patient, additionally signature is on file:  Botulism toxin has been approved by the Federal drug administration for treatment of chronic migraine.  Botulism toxin does not cure chronic migraine and it may not be effective in some patients.  The administration of botulism toxin is accomplished by injecting a small amount of toxin into the muscles of the neck and head. Dosage must be titrated for each individual. Any benefits resulting from botulism toxin tend to wear off after 3 months with a repeat injection required if benefit is to be maintained. Injections are usually done every 3-4 months with maximum effect peak achieved by about 2 or 3 weeks. Botulism toxin is expensive and you should be sure of what costs you will incur resulting from the injection.  The side effects of botulism toxin use for chronic migraine may include:   -Transient, and usually mild, facial weakness with facial injections  -Transient, and usually mild, head or neck weakness with head/neck injections  -Reduction or loss of forehead facial animation due to forehead muscle weakness  -Eyelid drooping  -Dry eye  -Pain at the site of injection or bruising at the site of injection  -Double vision  -Potential unknown long term risks  Contraindications: You should not have Botox  if you are pregnant, nursing, allergic to albumin, have an infection, skin condition, or muscle weakness at the site of the injection, or have myasthenia gravis, Lambert-Eaton syndrome, or ALS.  It is also possible that as with any injection, there may be an allergic reaction or no effect from the medication. Reduced effectiveness after repeated injections is sometimes seen and rarely infection at the injection site may occur. All care will be taken to prevent these side effects. If therapy is given over a long time, atrophy and wasting in the muscle injected may occur. Occasionally the patient's become refractory to treatment because they develop antibodies to the toxin. In this event, therapy needs to be modified.  I have read the above information and consent to the administration of botulism  toxin.    BOTOX  PROCEDURE NOTE FOR MIGRAINE HEADACHE    Contraindications and precautions discussed with patient(above). Aseptic procedure was observed and patient tolerated procedure. Procedure performed by Dr. Andree Epp  The condition has existed for more than 6 months, and pt does not have a diagnosis of ALS, Myasthenia Gravis or Lambert-Eaton Syndrome.  Risks and benefits of injections discussed and pt agrees to proceed with the procedure.  Written consent obtained  These injections are medically necessary. Pt  receives good benefits from these injections. These injections do not cause sedations or hallucinations which the oral therapies may cause.  Description of procedure:  The patient was placed in a sitting position. The standard protocol was used for Botox  as follows, with 5 units of Botox  injected at each site:   -Procerus muscle, midline injection  -Corrugator muscle, bilateral injection  -Frontalis muscle, bilateral injection, with 2 sites each side, medial injection was performed in the upper one third of the frontalis muscle, in the region vertical from the medial inferior edge of the superior orbital rim. The lateral injection was again in the upper one third of the forehead vertically above the lateral limbus of the cornea, 1.5 cm lateral to the medial injection site.  - Levator Scapulae: 5 units bilaterally  -Temporalis muscle injection, 5 sites, bilaterally. The  first injection was 3 cm above the tragus of the ear, second injection site was 1.5 cm to 3 cm up from the first injection site in line with the tragus of the ear. The third injection site was 1.5-3 cm forward between the first 2 injection sites. The fourth injection site was 1.5 cm posterior to the second injection site. 5th site laterally in the temporalis  muscleat the level of the outer canthus.  - Patient feels her clenching is a trigger for headaches. +10 units masseter bilaterally   - Patient feels the  migraines are centered around the eyes +5 units bilaterally at the outer canthus in the orbicularis occuli  -Occipitalis muscle injection, 3 sites, bilaterally. The first injection was done one half way between the occipital protuberance and the tip of the mastoid process behind the ear. The second injection site was done lateral and superior to the first, 1 fingerbreadth from the first injection. The third injection site was 1 fingerbreadth superiorly and medially from the first injection site.  -Cervical paraspinal muscle injection, 2 sites, bilateral knee first injection site was 1 cm from the midline of the cervical spine, 3 cm inferior to the lower border of the occipital protuberance. The second injection site was 1.5 cm superiorly and laterally to the first injection site.  -Trapezius muscle injection was performed at 3 sites, bilaterally. The first injection site was in the upper trapezius muscle halfway between the inflection point of the neck, and the acromion. The second injection site was one half way between the acromion and the first injection site. The third injection was done between the first injection site and the inflection point of the neck.   Will return for repeat injection in 3 months.   175 unit sof Botox  was used, 35U Botox  not injected was wasted. The patient tolerated the procedure well, there were no complications of the above procedure.

## 2024-02-25 ENCOUNTER — Other Ambulatory Visit (HOSPITAL_BASED_OUTPATIENT_CLINIC_OR_DEPARTMENT_OTHER): Payer: Self-pay

## 2024-04-07 DIAGNOSIS — H52221 Regular astigmatism, right eye: Secondary | ICD-10-CM | POA: Diagnosis not present

## 2024-04-07 DIAGNOSIS — H5203 Hypermetropia, bilateral: Secondary | ICD-10-CM | POA: Diagnosis not present

## 2024-04-07 DIAGNOSIS — H524 Presbyopia: Secondary | ICD-10-CM | POA: Diagnosis not present

## 2024-04-17 ENCOUNTER — Encounter: Payer: Self-pay | Admitting: Neurology

## 2024-04-20 NOTE — Telephone Encounter (Signed)
 Received the FMLA updated form, and dates of her days out due to migraines.  See pts mychart message concerning this as well below.  Hi, I am writing to let you know the past 2 months my migraines have increased. I have missed more work than usual due to the migraines. Typically, this is my normal increase with heat and pressure. Every summer:( [employer] has reached out due to the numbers of days I've missed and state they will be sending you a form to sign off on. [facility] is asking for your approval. They did state this will need to be done in 7 day time frame. I understand that you have more days per policy. Just reaching out to make you aware . Please let me know if I need to do anything on my end.

## 2024-04-21 ENCOUNTER — Encounter: Payer: Self-pay | Admitting: Family Medicine

## 2024-04-21 NOTE — Telephone Encounter (Signed)
 Patient has not been seen in office in over a year. Has CPE On 9/19. Ok to order labs or let patient know we can do at visit?

## 2024-04-23 ENCOUNTER — Telehealth: Payer: Self-pay | Admitting: Neurology

## 2024-04-23 NOTE — Telephone Encounter (Signed)
 Submitted auth request via CMM to transfer her to [facility], status is pending. Key: [id number]

## 2024-04-29 NOTE — Progress Notes (Unsigned)
 [facility] [address] [place] [address] Phone: [phone] Subjective:   Michelle Rhodes, am serving as a scribe for Dr. Arthea Claudene.  I'm seeing this patient by the request  of:  Michelle Baller, MD  CC: Back and neck pain  YEP:Dlagzrupcz  Michelle Rhodes is a 43 y.o. female coming in with complaint of back and neck pain. OMT on 10/01/2023. Patient states that her neck has been painful and she is having more headaches.   Medications patient has been prescribed:   Taking:       Reviewed prior external information including notes and imaging from previsou exam, outside providers and external EMR if available.   As well as notes that were available from care everywhere and other healthcare systems.  Past medical history, social, surgical and family history all reviewed in electronic medical record.  No pertanent information unless stated regarding to the chief complaint.   Past Medical History:  Diagnosis Date   Anxiety    Asthma    as a child   Migraine     No Known Allergies   Review of Systems:  No headache, visual changes, nausea, vomiting, diarrhea, constipation, dizziness, abdominal pain, skin rash, fevers, chills, night sweats, weight loss, swollen lymph nodes, body aches, joint swelling, chest pain, shortness of breath, mood changes. POSITIVE muscle aches  Objective  Blood pressure 108/72, pulse 70, Rhodes 5' 4 (1.626 m), weight 134 lb (60.8 kg), SpO2 97%.   General: No apparent distress alert and oriented x3 mood and affect normal, dressed appropriately.  HEENT: Pupils equal, extraocular movements intact  Respiratory: Patient's speak in full sentences and does not appear short of breath  Cardiovascular: No lower extremity edema, non tender, no erythema  Gait relatively normal gait MSK:  Back back does have some loss of lordosis noted.  Some tenderness to palpation in the paraspinal musculature.  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  Hoping that some of the cervicogenic aspect could also benefit and decrease some of her symptoms.  Discussed different medications including the gabapentin  again.  Discussed potential trigger points in the shoulder region which patient has done well with.  Patient wants to see how the manipulation does and will start with the home exercises.  Follow-up again in 6 to 8 weeks    Nonallopathic problems  Decision today to treat with OMT was based on Physical Exam  After verbal consent patient was treated with HVLA, ME, FPR techniques in cervical, rib, thoracic, lumbar, and sacral  areas  Patient tolerated the procedure well with improvement in symptoms  Patient given exercises, stretches and lifestyle modifications  See medications in patient instructions if given  Patient will follow up in 4-8 weeks    The above documentation has been reviewed and is accurate and complete Prather Failla M Summit Borchardt, DO          Note: This dictation was prepared with Dragon dictation along with smaller phrase technology. Any transcriptional errors that result from this process are unintentional.

## 2024-05-01 ENCOUNTER — Encounter: Payer: Self-pay | Admitting: Family Medicine

## 2024-05-01 ENCOUNTER — Ambulatory Visit (INDEPENDENT_AMBULATORY_CARE_PROVIDER_SITE_OTHER): Payer: 59 | Admitting: Family Medicine

## 2024-05-01 ENCOUNTER — Other Ambulatory Visit (HOSPITAL_BASED_OUTPATIENT_CLINIC_OR_DEPARTMENT_OTHER): Payer: Self-pay

## 2024-05-01 ENCOUNTER — Ambulatory Visit (INDEPENDENT_AMBULATORY_CARE_PROVIDER_SITE_OTHER): Admitting: Family Medicine

## 2024-05-01 ENCOUNTER — Telehealth: Payer: Self-pay | Admitting: Family Medicine

## 2024-05-01 VITALS — BP 108/72 | HR 70 | Ht 64.0 in | Wt 134.0 lb

## 2024-05-01 VITALS — BP 98/78 | HR 67 | Temp 98.1°F | Ht 64.0 in | Wt 132.5 lb

## 2024-05-01 DIAGNOSIS — R768 Other specified abnormal immunological findings in serum: Secondary | ICD-10-CM | POA: Diagnosis not present

## 2024-05-01 DIAGNOSIS — G43711 Chronic migraine without aura, intractable, with status migrainosus: Secondary | ICD-10-CM

## 2024-05-01 DIAGNOSIS — M9908 Segmental and somatic dysfunction of rib cage: Secondary | ICD-10-CM | POA: Diagnosis not present

## 2024-05-01 DIAGNOSIS — G4486 Cervicogenic headache: Secondary | ICD-10-CM | POA: Diagnosis not present

## 2024-05-01 DIAGNOSIS — R5383 Other fatigue: Secondary | ICD-10-CM

## 2024-05-01 DIAGNOSIS — E785 Hyperlipidemia, unspecified: Secondary | ICD-10-CM | POA: Diagnosis not present

## 2024-05-01 DIAGNOSIS — Z111 Encounter for screening for respiratory tuberculosis: Secondary | ICD-10-CM

## 2024-05-01 DIAGNOSIS — Z0000 Encounter for general adult medical examination without abnormal findings: Secondary | ICD-10-CM | POA: Diagnosis not present

## 2024-05-01 DIAGNOSIS — M9904 Segmental and somatic dysfunction of sacral region: Secondary | ICD-10-CM

## 2024-05-01 DIAGNOSIS — M9901 Segmental and somatic dysfunction of cervical region: Secondary | ICD-10-CM

## 2024-05-01 DIAGNOSIS — M9902 Segmental and somatic dysfunction of thoracic region: Secondary | ICD-10-CM

## 2024-05-01 DIAGNOSIS — R5382 Chronic fatigue, unspecified: Secondary | ICD-10-CM

## 2024-05-01 DIAGNOSIS — M9903 Segmental and somatic dysfunction of lumbar region: Secondary | ICD-10-CM | POA: Diagnosis not present

## 2024-05-01 LAB — LIPID PANEL
Cholesterol: 186 mg/dL (ref 0–200)
HDL: 69.8 mg/dL (ref >39.00)
LDL Cholesterol: 105 mg/dL — ABNORMAL HIGH (ref 0–99)
NonHDL: 115.82
Total CHOL/HDL Ratio: 3
Triglycerides: 56 mg/dL (ref 0.0–149.0)
VLDL: 11.2 mg/dL (ref 0.0–40.0)

## 2024-05-01 LAB — COMPREHENSIVE METABOLIC PANEL WITH GFR
ALT: 10 U/L (ref 0–35)
AST: 14 U/L (ref 0–37)
Albumin: 4.5 g/dL (ref 3.5–5.2)
Alkaline Phosphatase: 31 U/L — ABNORMAL LOW (ref 39–117)
BUN: 11 mg/dL (ref 6–23)
CO2: 25 meq/L (ref 19–32)
Calcium: 9.3 mg/dL (ref 8.4–10.5)
Chloride: 105 meq/L (ref 96–112)
Creatinine, Ser: 0.76 mg/dL (ref 0.40–1.20)
GFR: 97.07 mL/min (ref >60.00)
Glucose, Bld: 81 mg/dL (ref 70–99)
Potassium: 3.6 meq/L (ref 3.5–5.1)
Sodium: 138 meq/L (ref 135–145)
Total Bilirubin: 0.4 mg/dL (ref 0.2–1.2)
Total Protein: 7 g/dL (ref 6.0–8.3)

## 2024-05-01 LAB — VITAMIN D 25 HYDROXY (VIT D DEFICIENCY, FRACTURES): VITD: 46.1 ng/mL (ref 30.00–100.00)

## 2024-05-01 LAB — VITAMIN B12: Vitamin B-12: 365 pg/mL (ref 211–911)

## 2024-05-01 MED ORDER — ONDANSETRON HCL 4 MG PO TABS
4.0000 mg | ORAL_TABLET | Freq: Three times a day (TID) | ORAL | 0 refills | Status: DC | PRN
  Filled 2024-05-01: qty 20, 7d supply, fill #0

## 2024-05-01 NOTE — Assessment & Plan Note (Signed)
Appreciate neurology care.  

## 2024-05-01 NOTE — Telephone Encounter (Signed)
 Left message to return call to our office.

## 2024-05-01 NOTE — Patient Instructions (Addendum)
 Labs today  Good to see you today  Start citrucel fiber supplement regularly. May try miralax as well as needed. Update us  with effect.  Return as needed or in 1 year for next physical

## 2024-05-01 NOTE — Assessment & Plan Note (Signed)
 Chronic problem with worsening symptoms, has missed a significant amount of work secondary to patient's migraines.  Hoping that some of the cervicogenic aspect could also benefit and decrease some of her symptoms.  Discussed different medications including the gabapentin  again.  Discussed potential trigger points in the shoulder region which patient has done well with.  Patient wants to see how the manipulation does and will start with the home exercises.  Follow-up again in 6 to 8 weeks

## 2024-05-01 NOTE — Assessment & Plan Note (Addendum)
 Update labs. She attributes recently elevated readings to being on carnivore diet - now back to regular healthy diet.  The 10-year ASCVD risk score (Arnett DK, et al., 2019) is: 0.5%   Values used to calculate the score:     Age: 43 years     Clincally relevant sex: Female     Is Non-Hispanic African American: No     Diabetic: No     Tobacco smoker: No     Systolic Blood Pressure: 98 mmHg     Is BP treated: No     HDL Cholesterol: 57 mg/dL     Total Cholesterol: 233 mg/dL

## 2024-05-01 NOTE — Telephone Encounter (Signed)
 Yes ok to do - future labs ordered.  Remember labs expire after 1 year.

## 2024-05-01 NOTE — Progress Notes (Signed)
 Ph: [phone] Fax: [phone]   Patient ID: Michelle Rhodes, female    DOB: [birth year], 43 y.o.   MRN: [id number]  This visit was conducted in person.  BP 98/78   Pulse 67   Temp 98.1 F (36.7 C) (Oral)   Ht 5' 4 (1.626 m)   Wt 132 lb 8 oz (60.1 kg)   SpO2 100%   BMI 22.74 kg/m   BP Readings from Last 3 Encounters:  05/01/24 98/78  02/18/24 101/60  01/27/24 110/60    CC: CPE Subjective:   HPI: Michelle Rhodes is a 43 y.o. female presenting on 05/01/2024 for Annual Exam   Reviewed records from surgery 2016 - she had BTL for AUB, hysteroscopy, and novasure ablation. Ovaries remain. I am unable to change surgical history to remove oophorectomy.    Chronic migraines since age 43 yo followed [facility] Dr Ines - botox , topamax  100mg  nightly (does for several months at a time due to weight loss and side effects), nurtec, flexeril . Summer months are the worst.   Chronic sleep maintenance insomnia - 4 hours average. Has bedtime routine.  No benefit with melatonin or benadryl .   Mother with stroke x2 passed away remotely in her 62s, no known immediate family with brain aneurysms.   Notes increased fatigue.  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   Preventative: Colon cancer screening - not due Well woman exam with [facility] Dr Estil Mangle last seen 09/2023 [facility] - pending repeat  DEXA scan - not due  Lung cancer screening - not due  Flu shot - yearly at work  COVID shot - x2 Tdap 2009, 12/2017 Pneumonia shot - not due Shingrix - not due Advanced directive discussion -  Seat belt use discussed Sunscreen use discussed. No changing moles on skin.  Sleep - averaging 4 hours/night  Non smoker  Alcohol - seldom Dentist - q3 mo  Eye exam - yearly ([facility])  Bowel - notes some constipation. Good water and fiber in diet. Can go weeks without BM. Uses ex-lax prn infrequently. Stool softeners  ineffective. Linzess was helpful.    Right handed Lives at home with son, daughter and longterm partner. Caffeine use: tea occasionally with dinner Occ: [employer] [facility] and [employer] [facility] on weekends  Activity: goes to gym 3d/wk Diet: good water, fruits/vegetables daily      Relevant past medical, surgical, family and social history reviewed and updated as indicated. Interim medical history since our last visit reviewed. Allergies and medications reviewed and updated. Outpatient Medications Prior to Visit  Medication Sig Dispense Refill   ALPRAZolam  (XANAX ) 0.5 MG tablet Take 0.5-1 tablets (0.25-0.5 mg total) by mouth 2 (two) times daily as needed for anxiety (air travel). 10 tablet 0   botulinum toxin Type A  (BOTOX ) 100 units SOLR injection PROVIDER TO INJECT 155 UNITS INTRAMUSCULARLY INTO HEAD AND NECK EVERY 3 MONTHS. DISCARD REMAINDER. 2 each 0   cyclobenzaprine  (FLEXERIL ) 10 MG tablet Take 1 tablet (10 mg total) by mouth at bedtime. 90 tablet 0   Rimegepant Sulfate  (NURTEC) 75 MG TBDP Take 1 tablet (75 mg total) by mouth daily as needed (migraine) 16 tablet 11   SUMAtriptan  (IMITREX ) 6 MG/0.5ML SOLN injection Inject 0.5 mLs (6 mg total) into the skin every 2 (two) hours as needed for migraine or headache. Take one dose at headache onset, can take additional dose 2hrs later if needed. No  more then 2 injections in 24hrs 5 mL 11   topiramate  (TOPAMAX ) 100 MG tablet Take 1 tablet (100 mg total) by mouth at bedtime. 90 tablet 3   ondansetron  (ZOFRAN ) 4 MG tablet Take 1 tablet (4 mg total) by mouth every 8 (eight) hours as needed for nausea or vomiting. 15 tablet 0   scopolamine  (TRANSDERM-SCOP) 1 MG/3DAYS Place 1 patch (1.5 mg total) onto the skin every 3 (three) days. (Patient not taking: Reported on 05/01/2024) 4 patch 0   Facility-Administered Medications Prior to Visit  Medication Dose Route Frequency Provider Last Rate Last Admin   botulinum toxin Type A  (BOTOX ) injection 155 Units   155 Units Intramuscular Once Ines Onetha NOVAK, MD         Per HPI unless specifically indicated in ROS section below Review of Systems  Constitutional:  Negative for activity change, appetite change, chills, fatigue, fever and unexpected weight change.  HENT:  Negative for hearing loss.   Eyes:  Negative for visual disturbance.  Respiratory:  Negative for cough, chest tightness, shortness of breath and wheezing.   Cardiovascular:  Positive for palpitations (occ). Negative for chest pain and leg swelling.  Gastrointestinal:  Positive for abdominal pain (gas related) and constipation. Negative for abdominal distention, blood in stool, diarrhea, nausea and vomiting.  Genitourinary:  Negative for difficulty urinating and hematuria.  Musculoskeletal:  Negative for arthralgias, myalgias and neck pain.  Skin:  Negative for rash.  Neurological:  Positive for headaches (migraines). Negative for dizziness, seizures and syncope.  Hematological:  Negative for adenopathy. Does not bruise/bleed easily.  Psychiatric/Behavioral:  Negative for dysphoric mood. The patient is not nervous/anxious.     Objective:  BP 98/78   Pulse 67   Temp 98.1 F (36.7 C) (Oral)   Ht 5' 4 (1.626 m)   Wt 132 lb 8 oz (60.1 kg)   SpO2 100%   BMI 22.74 kg/m   Wt Readings from Last 3 Encounters:  05/01/24 132 lb 8 oz (60.1 kg)  02/18/24 138 lb (62.6 kg)  01/27/24 136 lb (61.7 kg)      Physical Exam Vitals and nursing note reviewed.  Constitutional:      Appearance: Normal appearance. She is not ill-appearing.  HENT:     Head: Normocephalic and atraumatic.     Right Ear: Tympanic membrane, ear canal and external ear normal. There is no impacted cerumen.     Left Ear: Tympanic membrane, ear canal and external ear normal. There is no impacted cerumen.     Mouth/Throat:     Mouth: Mucous membranes are moist.     Pharynx: Oropharynx is clear. No oropharyngeal exudate or posterior oropharyngeal erythema.  Eyes:      General:        Right eye: No discharge.        Left eye: No discharge.     Extraocular Movements: Extraocular movements intact.     Conjunctiva/sclera: Conjunctivae normal.     Pupils: Pupils are equal, round, and reactive to light.  Neck:     Thyroid : No thyroid  mass or thyromegaly.  Cardiovascular:     Rate and Rhythm: Normal rate and regular rhythm.     Pulses: Normal pulses.     Heart sounds: Normal heart sounds. No murmur heard. Pulmonary:     Effort: Pulmonary effort is normal. No respiratory distress.     Breath sounds: Normal breath sounds. No wheezing, rhonchi or rales.  Abdominal:     General: Bowel sounds are  normal. There is no distension.     Palpations: Abdomen is soft. There is no mass.     Tenderness: There is no abdominal tenderness. There is no guarding or rebound.     Hernia: No hernia is present.  Musculoskeletal:     Cervical back: Normal range of motion and neck supple. No rigidity.     Right lower leg: No edema.     Left lower leg: No edema.  Lymphadenopathy:     Cervical: No cervical adenopathy.  Skin:    General: Skin is warm and dry.     Findings: No rash.  Neurological:     General: No focal deficit present.     Mental Status: She is alert. Mental status is at baseline.  Psychiatric:        Mood and Affect: Mood normal.        Behavior: Behavior normal.       Results for orders placed or performed in visit on 01/27/24  CBC with Differential/Platelet   Collection Time: 01/27/24 10:20 AM  Result Value Ref Range   WBC 4.2 4.0 - 10.5 K/uL   RBC 4.05 3.87 - 5.11 Mil/uL   Hemoglobin 13.5 12.0 - 15.0 g/dL   HCT 60.5 63.9 - 53.9 %   MCV 97.4 78.0 - 100.0 fl   MCHC 34.1 30.0 - 36.0 g/dL   RDW 86.7 88.4 - 84.4 %   Platelets 344.0 150.0 - 400.0 K/uL   Neutrophils Relative % 62.9 43.0 - 77.0 %   Lymphocytes Relative 26.3 12.0 - 46.0 %   Monocytes Relative 8.3 3.0 - 12.0 %   Eosinophils Relative 1.0 0.0 - 5.0 %   Basophils Relative 1.5 0.0 - 3.0 %    Neutro Abs 2.7 1.4 - 7.7 K/uL   Lymphs Abs 1.1 0.7 - 4.0 K/uL   Monocytes Absolute 0.3 0.1 - 1.0 K/uL   Eosinophils Absolute 0.0 0.0 - 0.7 K/uL   Basophils Absolute 0.1 0.0 - 0.1 K/uL  Basic metabolic panel with GFR   Collection Time: 01/27/24 10:20 AM  Result Value Ref Range   Sodium 137 135 - 145 mEq/L   Potassium 4.2 3.5 - 5.1 mEq/L   Chloride 103 96 - 112 mEq/L   CO2 27 19 - 32 mEq/L   Glucose, Bld 85 70 - 99 mg/dL   BUN 12 6 - 23 mg/dL   Creatinine, Ser 9.29 0.40 - 1.20 mg/dL   GFR 892.66 >39.99 mL/min   Calcium 9.7 8.4 - 10.5 mg/dL  Hepatic function panel   Collection Time: 01/27/24 10:20 AM  Result Value Ref Range   Total Bilirubin 0.3 0.2 - 1.2 mg/dL   Bilirubin, Direct 0.1 0.0 - 0.3 mg/dL   Alkaline Phosphatase 32 (L) 39 - 117 U/L   AST 15 0 - 37 U/L   ALT 9 0 - 35 U/L   Total Protein 6.8 6.0 - 8.3 g/dL   Albumin 4.5 3.5 - 5.2 g/dL  TSH   Collection Time: 01/27/24 10:20 AM  Result Value Ref Range   TSH 1.64 0.35 - 5.50 uIU/mL  Iron, TIBC and Ferritin Panel   Collection Time: 01/27/24 10:20 AM  Result Value Ref Range   Iron 104 40 - 190 mcg/dL   TIBC 707 749 - 549 mcg/dL (calc)   %SAT 36 16 - 45 % (calc)   Ferritin 30 16 - 232 ng/mL  Protime-INR   Collection Time: 01/27/24 10:20 AM  Result Value Ref Range   INR  1.2 (H) 0.8 - 1.0 ratio   Prothrombin Time 12.2 9.6 - 13.1 sec    Assessment & Plan:   Problem List Items Addressed This Visit     Health maintenance examination - Primary (Chronic)   Preventative protocols reviewed and updated unless pt declined. Discussed healthy diet and lifestyle.       Chronic migraine without aura, with intractable migraine, so stated, with status migrainosus   Appreciate neurology care.       Positive ANA (antinuclear antibody)   Update levels. Previously thought false positive      Relevant Orders   ANA   Dyslipidemia   Update labs. She attributes recently elevated readings to being on carnivore diet - now  back to regular healthy diet.  The 10-year ASCVD risk score (Arnett DK, et al., 2019) is: 0.5%   Values used to calculate the score:     Age: 40 years     Clincally relevant sex: Female     Is Non-Hispanic African American: No     Diabetic: No     Tobacco smoker: No     Systolic Blood Pressure: 98 mmHg     Is BP treated: No     HDL Cholesterol: 57 mg/dL     Total Cholesterol: 233 mg/dL       Relevant Orders   Lipid panel   Comprehensive metabolic panel with GFR   Other Visit Diagnoses       Fatigue, unspecified type       Relevant Orders   VITAMIN D  25 Hydroxy (Vit-D Deficiency, Fractures)   Vitamin B12     Screening for tuberculosis       Relevant Orders   QuantiFERON-TB Gold Plus        Meds ordered this encounter  Medications   ondansetron  (ZOFRAN ) 4 MG tablet    Sig: Take 1 tablet (4 mg total) by mouth every 8 (eight) hours as needed for nausea or vomiting.    Dispense:  20 tablet    Refill:  0    Orders Placed This Encounter  Procedures   QuantiFERON-TB Gold Plus   Lipid panel   Comprehensive metabolic panel with GFR   VITAMIN D  25 Hydroxy (Vit-D Deficiency, Fractures)   Vitamin B12   ANA    Patient Instructions  Labs today  Good to see you today  Start citrucel fiber supplement regularly. May try miralax as well as needed. Update us  with effect.  Return as needed or in 1 year for next physical   Follow up plan: Return in about 1 year (around 05/01/2025), or if symptoms worsen or fail to improve, for annual exam, prior fasting for blood work.  Anton Blas, MD

## 2024-05-01 NOTE — Telephone Encounter (Signed)
 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

## 2024-05-01 NOTE — Assessment & Plan Note (Signed)
 Preventative protocols reviewed and updated unless pt declined. Discussed healthy diet and lifestyle.

## 2024-05-01 NOTE — Assessment & Plan Note (Addendum)
 Update levels. Previously thought false positive

## 2024-05-03 LAB — ANTI-NUCLEAR AB-TITER (ANA TITER): ANA Titer 1: 1:80 {titer} — ABNORMAL HIGH

## 2024-05-03 LAB — ANA: Anti Nuclear Antibody (ANA): POSITIVE — AB

## 2024-05-04 ENCOUNTER — Other Ambulatory Visit: Payer: Self-pay | Admitting: Pharmacy Technician

## 2024-05-04 ENCOUNTER — Other Ambulatory Visit: Payer: Self-pay

## 2024-05-04 MED ORDER — BOTOX 100 UNITS IJ SOLR
INTRAMUSCULAR | 3 refills | Status: AC
  Filled 2024-05-04: qty 2, fill #0
  Filled 2024-05-08: qty 2, 84d supply, fill #0
  Filled 2024-08-05: qty 2, 84d supply, fill #1

## 2024-05-04 NOTE — Telephone Encounter (Signed)
 Done , sent to [facility].

## 2024-05-04 NOTE — Addendum Note (Signed)
 Addended by: NEYSA NENA RAMAN on: 05/04/2024 02:33 PM   Modules accepted: Orders

## 2024-05-04 NOTE — Telephone Encounter (Signed)
 [facility] shara: [id number] exp. 04/27/24-10/24/24   She is being switched from [facility] to [facility] please send her botox  rx to [facility], thanks!

## 2024-05-05 ENCOUNTER — Other Ambulatory Visit: Payer: Self-pay

## 2024-05-05 ENCOUNTER — Ambulatory Visit: Admitting: Family Medicine

## 2024-05-05 ENCOUNTER — Other Ambulatory Visit (HOSPITAL_COMMUNITY): Payer: Self-pay

## 2024-05-05 ENCOUNTER — Ambulatory Visit: Payer: Self-pay | Admitting: Family Medicine

## 2024-05-05 LAB — QUANTIFERON-TB GOLD PLUS
Mitogen-NIL: >10 [IU]/mL
NIL: 0.02 [IU]/mL
QuantiFERON-TB Gold Plus: NEGATIVE
TB1-NIL: 0 [IU]/mL
TB2-NIL: 0.01 [IU]/mL

## 2024-05-05 NOTE — Telephone Encounter (Signed)
 Called patient all labs have been done. She didn't have any questions at this time.

## 2024-05-06 ENCOUNTER — Other Ambulatory Visit (HOSPITAL_COMMUNITY): Payer: Self-pay

## 2024-05-08 ENCOUNTER — Other Ambulatory Visit: Payer: Self-pay

## 2024-05-08 ENCOUNTER — Other Ambulatory Visit (HOSPITAL_COMMUNITY): Payer: Self-pay

## 2024-05-08 NOTE — Progress Notes (Signed)
 Specialty Pharmacy Initial Fill Coordination Note  Michelle Rhodes is a 43 y.o. female contacted today regarding initial fill of specialty medication(s) OnabotulinumtoxinA  (Botox )   Patient requested Courier to Provider Office   Delivery date: 05/13/24   Verified address: [address], [place], [place] [address], [facility]   Medication will be filled on 05/12/2024.   Patient is aware of 0 copayment.

## 2024-05-11 ENCOUNTER — Other Ambulatory Visit (HOSPITAL_BASED_OUTPATIENT_CLINIC_OR_DEPARTMENT_OTHER): Payer: Self-pay

## 2024-05-12 ENCOUNTER — Other Ambulatory Visit: Payer: Self-pay

## 2024-05-13 NOTE — Telephone Encounter (Signed)
 I called pt and cancelled her upcoming Botox  with Dr. Ines. She states she has seen Amy in the past for her Botox  and is comfortable seeing her again. Rescheduled appt with Amy for 10/9 @ 8:00 am.

## 2024-05-18 ENCOUNTER — Telehealth: Payer: Self-pay | Admitting: Pharmacist

## 2024-05-18 NOTE — Telephone Encounter (Signed)
 Pharmacy Patient Advocate Encounter   Received notification from CoverMyMeds that prior authorization for Nurtec 75MG  dispersible tablets is required/requested.   Insurance verification completed.   The patient is insured through [facility].   Per test claim: PA required; PA submitted to above mentioned insurance via Latent Key/confirmation #/EOC  [facility] Status is pending

## 2024-05-18 NOTE — Telephone Encounter (Signed)
 Pharmacy Patient Advocate Encounter  Received notification from [facility] that Prior Authorization for NURTEC 75 MG PO TBDP has been APPROVED from 05/18/2024 to 05/17/2025   PA #/Case ID/Reference #: [id number]

## 2024-05-19 ENCOUNTER — Ambulatory Visit: Admitting: Neurology

## 2024-05-19 ENCOUNTER — Encounter: Payer: Self-pay | Admitting: Family Medicine

## 2024-05-20 NOTE — Telephone Encounter (Signed)
 Printed form.   Done.  Dr. Margaret signed.

## 2024-05-20 NOTE — Progress Notes (Unsigned)
 05/21/24 ALL: Michelle Rhodes presents for Botox . She continues topiramate  100mg  daily and Nurtec or sumatriptan  injections as needed. She request to omit trapezius injections and add masseter injections.     Consent Form Botulism Toxin Injection For Chronic Migraine    Reviewed orally with patient, additionally signature is on file:  Botulism toxin has been approved by the Federal drug administration for treatment of chronic migraine. Botulism toxin does not cure chronic migraine and it may not be effective in some patients.  The administration of botulism toxin is accomplished by injecting a small amount of toxin into the muscles of the neck and head. Dosage must be titrated for each individual. Any benefits resulting from botulism toxin tend to wear off after 3 months with a repeat injection required if benefit is to be maintained. Injections are usually done every 3-4 months with maximum effect peak achieved by about 2 or 3 weeks. Botulism toxin is expensive and you should be sure of what costs you will incur resulting from the injection.  The side effects of botulism toxin use for chronic migraine may include:   -Transient, and usually mild, facial weakness with facial injections  -Transient, and usually mild, head or neck weakness with head/neck injections  -Reduction or loss of forehead facial animation due to forehead muscle weakness  -Eyelid drooping  -Dry eye  -Pain at the site of injection or bruising at the site of injection  -Double vision  -Potential unknown long term risks   Contraindications: You should not have Botox  if you are pregnant, nursing, allergic to albumin, have an infection, skin condition, or muscle weakness at the site of the injection, or have myasthenia gravis, Lambert-Eaton syndrome, or ALS.  It is also possible that as with any injection, there may be an allergic reaction or no effect from the medication. Reduced effectiveness after repeated injections is  sometimes seen and rarely infection at the injection site may occur. All care will be taken to prevent these side effects. If therapy is given over a long time, atrophy and wasting in the muscle injected may occur. Occasionally the patient's become refractory to treatment because they develop antibodies to the toxin. In this event, therapy needs to be modified.  I have read the above information and consent to the administration of botulism toxin.    BOTOX  PROCEDURE NOTE FOR MIGRAINE HEADACHE  Contraindications and precautions discussed with patient(above). Aseptic procedure was observed and patient tolerated procedure. Procedure performed by Greig Forbes, FNP-C.   The condition has existed for more than 6 months, and pt does not have a diagnosis of ALS, Myasthenia Gravis or Lambert-Eaton Syndrome.  Risks and benefits of injections discussed and pt agrees to proceed with the procedure.  Written consent obtained  These injections are medically necessary. Pt  receives good benefits from these injections. These injections do not cause sedations or hallucinations which the oral therapies may cause.   Description of procedure:  The patient was placed in a sitting position. The standard protocol was used for Botox  as follows, with 5 units of Botox  injected at each site:  -Procerus muscle, midline injection  -Corrugator muscle, bilateral injection  -Frontalis muscle, bilateral injection, with 2 sites each side, medial injection was performed in the upper one third of the frontalis muscle, in the region vertical from the medial inferior edge of the superior orbital rim. The lateral injection was again in the upper one third of the forehead vertically above the lateral limbus of the cornea, 1.5  cm lateral to the medial injection site.  -Temporalis muscle injection, 4 sites, bilaterally. The first injection was 3 cm above the tragus of the ear, second injection site was 1.5 cm to 3 cm up from the first  injection site in line with the tragus of the ear. The third injection site was 1.5-3 cm forward between the first 2 injection sites. The fourth injection site was 1.5 cm posterior to the second injection site. 5th site laterally in the temporalis  muscleat the level of the outer canthus.  -Occipitalis muscle injection, 3 sites, bilaterally. The first injection was done one half way between the occipital protuberance and the tip of the mastoid process behind the ear. The second injection site was done lateral and superior to the first, 1 fingerbreadth from the first injection. The third injection site was 1 fingerbreadth superiorly and medially from the first injection site.  -Cervical paraspinal muscle injection, 2 sites, bilaterally. The first injection site was 1 cm from the midline of the cervical spine, 3 cm inferior to the lower border of the occipital protuberance. The second injection site was 1.5 cm superiorly and laterally to the first injection site.  -masseter muscle injections bilaterally    Will return for repeat injection in 3 months.   A total of 200 units of Botox  was prepared, 135 units of Botox  was injected as documented above, any Botox  not injected was wasted. The patient tolerated the procedure well, there were no complications of the above procedure.

## 2024-05-21 ENCOUNTER — Ambulatory Visit (INDEPENDENT_AMBULATORY_CARE_PROVIDER_SITE_OTHER): Admitting: Family Medicine

## 2024-05-21 ENCOUNTER — Encounter: Payer: Self-pay | Admitting: Family Medicine

## 2024-05-21 DIAGNOSIS — G43711 Chronic migraine without aura, intractable, with status migrainosus: Secondary | ICD-10-CM | POA: Diagnosis not present

## 2024-05-21 MED ORDER — ONABOTULINUMTOXINA 200 UNITS IJ SOLR
155.0000 [IU] | Freq: Once | INTRAMUSCULAR | Status: AC
  Administered 2024-05-21: 135 [IU] via INTRAMUSCULAR

## 2024-05-21 NOTE — Progress Notes (Signed)
 Botox - 100 units x 1 vial Lot: [id number] Expiration: 05/2026 NDC: 9976-8854-98   Botox - 100 units x 1 vial Lot: [id number] Expiration: 06/2026 NDC: 9976-8854-98  Bacteriostatic 0.9% Sodium Chloride - 4mL total Lot: [id number] Expiration: 06/12/2025 NDC: 9590-8033-97  Dx: H56.288 S/P  Witnessed By: Maurilio Molt, RN

## 2024-06-01 NOTE — Progress Notes (Deleted)
  [facility] [address] [place] [address] Phone: [phone] Subjective:    I'm seeing this patient by the request  of:  Rilla Baller, MD  CC:   YEP:Dlagzrupcz  Michelle Rhodes is a 43 y.o. female coming in with complaint of back and neck pain. OMT on 05/01/2024. Patient states   Medications patient has been prescribed:   Taking:         Reviewed prior external information including notes and imaging from previsou exam, outside providers and external EMR if available.   As well as notes that were available from care everywhere and other healthcare systems.  Past medical history, social, surgical and family history all reviewed in electronic medical record.  No pertanent information unless stated regarding to the chief complaint.   Past Medical History:  Diagnosis Date   Anxiety    Asthma    as a child   Migraine     No Known Allergies   Review of Systems:  No headache, visual changes, nausea, vomiting, diarrhea, constipation, dizziness, abdominal pain, skin rash, fevers, chills, night sweats, weight loss, swollen lymph nodes, body aches, joint swelling, chest pain, shortness of breath, mood changes. POSITIVE muscle aches  Objective  There were no vitals taken for this visit.   General: No apparent distress alert and oriented x3 mood and affect normal, dressed appropriately.  HEENT: Pupils equal, extraocular movements intact  Respiratory: Patient's speak in full sentences and does not appear short of breath  Cardiovascular: No lower extremity edema, non tender, no erythema  Gait MSK:  Back   Osteopathic findings  C2 flexed rotated and side bent right C6 flexed rotated and side bent left T3 extended rotated and side bent right inhaled rib T9 extended rotated and side bent left L2 flexed rotated and side bent right Sacrum right on right       Assessment and Plan:  No problem-specific Assessment & Plan notes  found for this encounter.    Nonallopathic problems  Decision today to treat with OMT was based on Physical Exam  After verbal consent patient was treated with HVLA, ME, FPR techniques in cervical, rib, thoracic, lumbar, and sacral  areas  Patient tolerated the procedure well with improvement in symptoms  Patient given exercises, stretches and lifestyle modifications  See medications in patient instructions if given  Patient will follow up in 4-8 weeks             Note: This dictation was prepared with Dragon dictation along with smaller phrase technology. Any transcriptional errors that result from this process are unintentional.

## 2024-06-02 ENCOUNTER — Ambulatory Visit: Admitting: Family Medicine

## 2024-06-08 ENCOUNTER — Other Ambulatory Visit (HOSPITAL_BASED_OUTPATIENT_CLINIC_OR_DEPARTMENT_OTHER): Payer: Self-pay

## 2024-06-08 ENCOUNTER — Ambulatory Visit: Admitting: Family Medicine

## 2024-06-08 ENCOUNTER — Encounter: Payer: Self-pay | Admitting: Family Medicine

## 2024-06-08 ENCOUNTER — Ambulatory Visit: Attending: Family Medicine

## 2024-06-08 VITALS — BP 100/60 | HR 64 | Temp 98.7°F | Wt 126.6 lb

## 2024-06-08 DIAGNOSIS — R000 Tachycardia, unspecified: Secondary | ICD-10-CM

## 2024-06-08 DIAGNOSIS — R112 Nausea with vomiting, unspecified: Secondary | ICD-10-CM | POA: Diagnosis not present

## 2024-06-08 DIAGNOSIS — R0789 Other chest pain: Secondary | ICD-10-CM

## 2024-06-08 MED ORDER — PREDNISONE 10 MG PO TABS
ORAL_TABLET | ORAL | 0 refills | Status: DC
  Filled 2024-06-08: qty 18, 9d supply, fill #0

## 2024-06-08 MED ORDER — ONDANSETRON HCL 4 MG PO TABS
4.0000 mg | ORAL_TABLET | Freq: Three times a day (TID) | ORAL | 0 refills | Status: DC | PRN
  Filled 2024-06-08: qty 20, 7d supply, fill #0

## 2024-06-08 MED ORDER — OMEPRAZOLE 40 MG PO CPDR
40.0000 mg | DELAYED_RELEASE_CAPSULE | Freq: Every day | ORAL | 3 refills | Status: AC
  Filled 2024-06-08: qty 30, 30d supply, fill #0
  Filled 2024-08-02: qty 30, 30d supply, fill #1

## 2024-06-08 NOTE — Progress Notes (Unsigned)
   Subjective:    Patient ID: Michelle Rhodes, female    DOB: March 18, 1981, 43 y.o.   MRN: [id number]  HPI Chest pain- L side of sternum, present for 'weeks'.  On Thursday had an episode of rapid HR that lasted for ~1 hr.  HR was in the 160s.  Denies SOB.  Some light headedness.  Did have a migraine at this time.  At times will have flushing that precedes the elevated HR- but this has not been recent.  Vomiting- pt reports she is vomiting 'all the time' w/ her recent migraines.  Sees Neuro for migraines.  Taking a lot of BC's to help w/ HA's.   Review of Systems For ROS see HPI     Objective:   Physical Exam Vitals reviewed.  Constitutional:      General: She is not in acute distress.    Appearance: She is well-developed.  HENT:     Head: Normocephalic and atraumatic.  Eyes:     Conjunctiva/sclera: Conjunctivae normal.     Pupils: Pupils are equal, round, and reactive to light.  Neck:     Thyroid : No thyromegaly.  Cardiovascular:     Rate and Rhythm: Normal rate and regular rhythm.     Heart sounds: Normal heart sounds. No murmur heard. Pulmonary:     Effort: Pulmonary effort is normal. No respiratory distress.     Breath sounds: Normal breath sounds.  Chest:     Chest wall: Tenderness (just L of sternum at chondrocostal jxn) present.  Abdominal:     General: There is no distension.     Palpations: Abdomen is soft.     Tenderness: There is no abdominal tenderness.  Musculoskeletal:     Cervical back: Normal range of motion and neck supple.  Lymphadenopathy:     Cervical: No cervical adenopathy.  Skin:    General: Skin is warm and dry.  Neurological:     Mental Status: She is alert and oriented to person, place, and time.  Psychiatric:        Behavior: Behavior normal.           Assessment & Plan:  Chest pain- new.  Pt has TTP over L sternocostal jxn.  This is likely due to the amount of vomiting and dry heaving she has been doing.  EKG WNL.  Denies SOB.  Will  start Prednisone  for pain.  Due to episode of tachycardia will get Zio monitor.  Reviewed supportive care and red flags that should prompt return.  Pt expressed understanding and is in agreement w/ plan.   N/V- new to provider, ongoing for pt.  Occurs in conjunction w/ migraines.  She has been taking quite a bit of BC's due to migraine pain.  Suspect gastritis.  Zofran  prn.  Omeprazole 40mg  daily.  Reviewed supportive care and red flags that should prompt return.  Pt expressed understanding and is in agreement w/ plan.   Tachycardia- pt reports HR was in the 160s for at least an hour.  Denies SOB.  Not sure if this was related to her migraine or independent.  Will get Zio monitor.

## 2024-06-08 NOTE — Patient Instructions (Signed)
 Follow up as needed or as scheduled START the Prednisone  as directed- 3 pills at the same time x3 days, then 2 pills at the same time x3 days, then 1 pill daily.  Take w/ food  ADD Omeprazole daily for acid and nausea Take the Zofran  as needed Apply and wear the monitor as directed Make sure you are eating and drinking regularly Call with any questions or concerns Hang in there!!

## 2024-06-09 ENCOUNTER — Telehealth: Payer: Self-pay | Admitting: Family Medicine

## 2024-06-09 ENCOUNTER — Encounter: Payer: Self-pay | Admitting: Family Medicine

## 2024-06-09 ENCOUNTER — Telehealth: Admitting: Family Medicine

## 2024-06-09 ENCOUNTER — Other Ambulatory Visit (HOSPITAL_BASED_OUTPATIENT_CLINIC_OR_DEPARTMENT_OTHER): Payer: Self-pay

## 2024-06-09 DIAGNOSIS — G43711 Chronic migraine without aura, intractable, with status migrainosus: Secondary | ICD-10-CM | POA: Diagnosis not present

## 2024-06-09 MED ORDER — TOPIRAMATE 50 MG PO TABS
50.0000 mg | ORAL_TABLET | Freq: Every day | ORAL | 3 refills | Status: AC
  Filled 2024-06-09: qty 90, 90d supply, fill #0
  Filled 2024-08-02: qty 90, 90d supply, fill #1

## 2024-06-09 NOTE — Patient Instructions (Signed)
 Below is our plan:  We will continue Botox . Restart topiramate  50mg  daily and try to take consistently. Continue Nurtec and sumatriptan  injections as needed and try to wean use of BC powders. Please call the office to discuss balance with our billing department and I will ask [name] to send you information on the Botox  saving program.   Please make sure you are staying well hydrated. I recommend 50-60 ounces daily. Well balanced diet and regular exercise encouraged. Consistent sleep schedule with 6-8 hours recommended.   Please continue follow up with care team as directed.   Follow up with me in 1 year   You may receive a survey regarding today's visit. I encourage you to leave honest feed back as I do use this information to improve patient care. Thank you for seeing me today!   GENERAL HEADACHE INFORMATION:   Natural supplements: Magnesium  Oxide or Magnesium  Glycinate 500 mg at bed (up to 800 mg daily) Coenzyme Q10 300 mg in AM Vitamin B2- 200 mg twice a day   Add 1 supplement at a time since even natural supplements can have undesirable side effects. You can sometimes buy supplements cheaper (especially Coenzyme Q10) at www.webmailguide.co.za or at [facility].  Migraine with aura: There is increased risk for stroke in women with migraine with aura and a contraindication for the combined contraceptive pill for use by women who have migraine with aura. The risk for women with migraine without aura is lower. However other risk factors like smoking are far more likely to increase stroke risk than migraine. There is a recommendation for no smoking and for the use of OCPs without estrogen such as progestogen only pills particularly for women with migraine with aura.SABRA People who have migraine headaches with auras may be 3 times more likely to have a stroke caused by a blood clot, compared to migraine patients who don't see auras. Women who take hormone-replacement therapy may be 30 percent more likely to  suffer a clot-based stroke than women not taking medication containing estrogen. Other risk factors like smoking and high blood pressure may be  much more important.    Vitamins and herbs that show potential:   Magnesium : Magnesium  (250 mg twice a day or 500 mg at bed) has a relaxant effect on smooth muscles such as blood vessels. Individuals suffering from frequent or daily headache usually have low magnesium  levels which can be increase with daily supplementation of 400-750 mg. Three trials found 40-90% average headache reduction  when used as a preventative. Magnesium  may help with headaches are aura, the best evidence for magnesium  is for migraine with aura is its thought to stop the cortical spreading depression we believe is the pathophysiology of migraine aura.Magnesium  also demonstrated the benefit in menstrually related migraine.  Magnesium  is part of the messenger system in the serotonin cascade and it is a good muscle relaxant.  It is also useful for constipation which can be a side effect of other medications used to treat migraine. Good sources include nuts, whole grains, and tomatoes. Side Effects: loose stool/diarrhea  Riboflavin (vitamin B 2) 200 mg twice a day. This vitamin assists nerve cells in the production of ATP a principal energy storing molecule.  It is necessary for many chemical reactions in the body.  There have been at least 3 clinical trials of riboflavin using 400 mg per day all of which suggested that migraine frequency can be decreased.  All 3 trials showed significant improvement in over half of migraine sufferers.  The supplement is found in bread, cereal, milk, meat, and poultry.  Most Americans get more riboflavin than the recommended daily allowance, however riboflavin deficiency is not necessary for the supplements to help prevent headache. Side effects: energizing, green urine   Coenzyme Q10: This is present in almost all cells in the body and is critical component for  the conversion of energy.  Recent studies have shown that a nutritional supplement of CoQ10 can reduce the frequency of migraine attacks by improving the energy production of cells as with riboflavin.  Doses of 150 mg twice a day have been shown to be effective.   Melatonin: Increasing evidence shows correlation between melatonin secretion and headache conditions.  Melatonin supplementation has decreased headache intensity and duration.  It is widely used as a sleep aid.  Sleep is natures way of dealing with migraine.  A dose of 3 mg is recommended to start for headaches including cluster headache. Higher doses up to 15 mg has been reviewed for use in Cluster headache and have been used. The rationale behind using melatonin for cluster is that many theories regarding the cause of Cluster headache center around the disruption of the normal circadian rhythm in the brain.  This helps restore the normal circadian rhythm.   HEADACHE DIET: Foods and beverages which may trigger migraine Note that only 20% of headache patients are food sensitive. You will know if you are food sensitive if you get a headache consistently 20 minutes to 2 hours after eating a certain food. Only cut out a food if it causes headaches, otherwise you might remove foods you enjoy! What matters most for diet is to eat a well balanced healthy diet full of vegetables and low fat protein, and to not miss meals.   Chocolate, other sweets ALL cheeses except cottage and cream cheese Dairy products, yogurt, sour cream, ice cream Liver Meat extracts (Bovril, Marmite, meat tenderizers) Meats or fish which have undergone aging, fermenting, pickling or smoking. These include: Hotdogs,salami,Lox,sausage, mortadellas,smoked salmon, pepperoni, Pickled herring Pods of broad bean (English beans, Chinese pea pods, Italian (fava) beans, lima and navy beans Ripe avocado, ripe banana Yeast extracts or active yeast preparations such as Brewer's or  Fleishman's (commercial bakes goods are permitted) Tomato based foods, pizza (lasagna, etc.)   MSG (monosodium glutamate) is disguised as many things; look for these common aliases: Monopotassium glutamate Autolysed yeast Hydrolysed protein Sodium caseinate "flavorings" "all natural preservatives Nutrasweet   Avoid all other foods that convincingly provoke headaches.   Resources: The Dizzy Bluford Aid Your Headache Diet, migrainestrong.com  https://zamora-andrews.com/   Caffeine and Migraine For patients that have migraine, caffeine intake more than 3 days per week can lead to dependency and increased migraine frequency. I would recommend cutting back on your caffeine intake as best you can. The recommended amount of caffeine is 200-300 mg daily, although migraine patients may experience dependency at even lower doses. While you may notice an increase in headache temporarily, cutting back will be helpful for headaches in the long run. For more information on caffeine and migraine, visit: https://americanmigrainefoundation.org/resource-library/caffeine-and-migraine/   Headache Prevention Strategies:   1. Maintain a headache diary; learn to identify and avoid triggers.  - This can be a simple note where you log when you had a headache, associated symptoms, and medications used - There are several smartphone apps developed to help track migraines: Migraine Buddy, Migraine Monitor, Curelator N1-Headache App   Common triggers include: Emotional triggers: Emotional/Upset family or friends Emotional/Upset occupation Business reversal/success  Anticipation anxiety Crisis-serious Post-crisis periodNew job/position   Physical triggers: Vacation Day Weekend Strenuous Exercise High Altitude Location New Move Menstrual Day Physical Illness Oversleep/Not enough sleep Weather changes Light: Photophobia or light sesnitivity treatment involves  a balance between desensitization and reduction in overly strong input. Use dark polarized glasses outside, but not inside. Avoid bright or fluorescent light, but do not dim environment to the point that going into a normally lit room hurts. Consider FL-41 tint lenses, which reduce the most irritating wavelengths without blocking too much light.  These can be obtained at axonoptics.com or theraspecs.com Foods: see list above.   2. Limit use of acute treatments (over-the-counter medications, triptans, etc.) to no more than 2 days per week or 10 days per month to prevent medication overuse headache (rebound headache).     3. Follow a regular schedule (including weekends and holidays): Don't skip meals. Eat a balanced diet. 8 hours of sleep nightly. Minimize stress. Exercise 30 minutes per day. Being overweight is associated with a 5 times increased risk of chronic migraine. Keep well hydrated and drink 6-8 glasses of water per day.   4. Initiate non-pharmacologic measures at the earliest onset of your headache. Rest and quiet environment. Relax and reduce stress. Breathe2Relax is a free app that can instruct you on    some simple relaxtion and breathing techniques. Http://Dawnbuse.com is a    free website that provides teaching videos on relaxation.  Also, there are  many apps that   can be downloaded for "mindful" relaxation.  An app called YOGA NIDRA will help walk you through mindfulness. Another app called Calm can be downloaded to give you a structured mindfulness guide with daily reminders and skill development. Headspace for guided meditation Mindfulness Based Stress Reduction Online Course: www.palousemindfulness.com Cold compresses.   5. Don't wait!! Take the maximum allowable dosage of prescribed medication at the first sign of migraine.   6. Compliance:  Take prescribed medication regularly as directed and at the first sign of a migraine.   7. Communicate:  Call your physician when  problems arise, especially if your headaches change, increase in frequency/severity, or become associated with neurological symptoms (weakness, numbness, slurred speech, etc.). Proceed to emergency room if you experience new or worsening symptoms or symptoms do not resolve, if you have new neurologic symptoms or if headache is severe, or for any concerning symptom.   8. Headache/pain management therapies: Consider various complementary methods, including medication, behavioral therapy, psychological counselling, biofeedback, massage therapy, acupuncture, dry needling, and other modalities.  Such measures may reduce the need for medications. Counseling for pain management, where patients learn to function and ignore/minimize their pain, seems to work very well.   9. Recommend changing family's attention and focus away from patient's headaches. Instead, emphasize daily activities. If first question of day is 'How are your headaches/Do you have a headache today?', then patient will constantly think about headaches, thus making them worse. Goal is to re-direct attention away from headaches, toward daily activities and other distractions.   10. Helpful Websites: www.AmericanHeadacheSociety.org patenthood.ch www.headaches.org tightmarket.nl www.achenet.org

## 2024-06-09 NOTE — Telephone Encounter (Signed)
-----   Message from Michelle Rhodes sent at 06/09/2024  3:34 PM EDT ----- She told me that she has a balance and can't reschedule Botox  until paid. She said she talked to billing and they could not explain why she owed money?? I told her to call Angie next week but I was going to see if you could send her info for the savings program? TY!!!

## 2024-06-09 NOTE — Telephone Encounter (Signed)
 Spoke w/ Amy. She spoke with pt and scheduled VV at 245p with pt.

## 2024-06-09 NOTE — Progress Notes (Unsigned)
 EP to read.

## 2024-06-09 NOTE — Progress Notes (Signed)
 PATIENT: Michelle Rhodes DOB: 1980-08-18  REASON FOR VISIT: follow up HISTORY FROM: patient  Virtual Visit via MyChart video  I connected with Michelle Rhodes on 06/09/24 at  2:45 PM EDT via MyChart video and verified that I am speaking with the correct person using two identifiers.   I discussed the limitations, risks, security and privacy concerns of performing an evaluation and management service by Mychart video and the availability of in person appointments. I also discussed with the patient that there may be a patient responsible charge related to this service. The patient expressed understanding and agreed to proceed.   History of Present Illness:  06/09/24 ALL (MyChart): Michelle Rhodes is a 43 y.o. female here today for follow up for an acute migraine. She has exceeded FMLA days this month and requesting a work note for a missed day 10/24. She was seen by PCP yesterday. She was started on nausea meds for GI distress. She reported heart racing and feeling flushed. She is taking BC powders daily. Usually one daily. She is going through all 16 tablets each month. She uses sumatriptan  injections a couple times a month. She reports stopping topiramate  about 2 months. She reports episodic numbness and tingling. She tried a lower dose in the past and felt it helped with side effects. She is drinking about a gallon of water daily.    Observations/Objective:  Generalized: Well developed, in no acute distress  Mentation: Alert oriented to time, place, history taking. Follows all commands speech and language fluent   Assessment and Plan:  43 y.o. year old female  has a past medical history of Anxiety, Asthma, and Migraine. here with    ICD-10-CM   1. Chronic migraine without aura, with intractable migraine, so stated, with status migrainosus  G43.711 topiramate  (TOPAMAX ) 50 MG tablet     Michelle Rhodes reports having an increased number of migraines days this month. FMLA allows for 4-6  events lasting up to 24 hours. I have advised her to be consistent with topiramate  dosing. I will reduce dose to 50mg  every night to see if this helps with side effects. She will continue Nurtec and sumatriptan  as needed for abortive therapy. We discussed the importance of weaning BC powders. PCP discussed with her, yesterday, as well. Risks of rebound headaches and GI complications reviewed. Healthy lifestyle habits encouraged. She will contact billing to discuss balance so she may schedule next Botox . I will ask [name] to send her information regarding Botox  Savings Program. She will follow up with me in 1 year, sooner if needed.    No orders of the defined types were placed in this encounter.   Meds ordered this encounter  Medications   topiramate  (TOPAMAX ) 50 MG tablet    Sig: Take 1 tablet (50 mg total) by mouth daily.    Dispense:  90 tablet    Refill:  3    Supervising Provider:   YAN, YIJUN [phone]   I personally spent a total of 30 minutes in the care of the patient today including preparing to see the patient, getting/reviewing separately obtained history, performing a medically appropriate exam/evaluation, counseling and educating, placing orders, and documenting clinical information in the EHR.   Follow Up Instructions:  I discussed the assessment and treatment plan with the patient. The patient was provided an opportunity to ask questions and all were answered. The patient agreed with the plan and demonstrated an understanding of the instructions.   The patient was advised to call  back or seek an in-person evaluation if the symptoms worsen or if the condition fails to improve as anticipated.  I provided 30 minutes of face-to-face and non face-to-face time during this MyChart video encounter. Patient located at their place of residence. Provider is in the office.    [name], NP

## 2024-06-10 ENCOUNTER — Encounter: Payer: Self-pay | Admitting: Family Medicine

## 2024-06-10 ENCOUNTER — Other Ambulatory Visit: Payer: Self-pay | Admitting: Family Medicine

## 2024-06-10 ENCOUNTER — Other Ambulatory Visit: Payer: Self-pay

## 2024-06-10 NOTE — Telephone Encounter (Signed)
Work note has been sent through [employer]

## 2024-06-11 ENCOUNTER — Other Ambulatory Visit (HOSPITAL_BASED_OUTPATIENT_CLINIC_OR_DEPARTMENT_OTHER): Payer: Self-pay

## 2024-06-12 ENCOUNTER — Other Ambulatory Visit (INDEPENDENT_AMBULATORY_CARE_PROVIDER_SITE_OTHER)

## 2024-06-12 ENCOUNTER — Other Ambulatory Visit: Payer: Self-pay

## 2024-06-12 ENCOUNTER — Other Ambulatory Visit (HOSPITAL_BASED_OUTPATIENT_CLINIC_OR_DEPARTMENT_OTHER): Payer: Self-pay

## 2024-06-12 DIAGNOSIS — R233 Spontaneous ecchymoses: Secondary | ICD-10-CM

## 2024-06-12 LAB — VITAMIN B12: Vitamin B-12: >1500 pg/mL — ABNORMAL HIGH (ref 211–911)

## 2024-06-12 MED ORDER — ALPRAZOLAM 0.5 MG PO TABS
0.2500 mg | ORAL_TABLET | Freq: Two times a day (BID) | ORAL | 0 refills | Status: DC | PRN
  Filled 2024-06-12: qty 10, 5d supply, fill #0

## 2024-06-12 NOTE — Telephone Encounter (Signed)
 ERx Last filled 02/2024

## 2024-06-13 LAB — IRON,TIBC AND FERRITIN PANEL
%SAT: 49 % — ABNORMAL HIGH (ref 16–45)
Ferritin: 40 ng/mL (ref 16–232)
Iron: 149 ug/dL (ref 40–190)
TIBC: 306 ug/dL (ref 250–450)

## 2024-06-15 ENCOUNTER — Telehealth: Payer: Self-pay | Admitting: *Deleted

## 2024-06-15 NOTE — Telephone Encounter (Signed)
 Hello,  Our office received a ZIO Home Enrollment order from [facility] [address] the address is reflecting invalid in the USPS address verification system:   The address for this contact is not valid or could not be verified. USPS Details: [place] Input address matched to the ZIP+4 file. [place] address missing secondary number.   We need to verify address ] to process the order.   Patients Initials: T.H  Patient ID: [id number]   We have reached out to the patient and have not heard back. Please give us  a call to confirm the patient's address so we can proceed with shipping this order to the patient. If you need any additional information, please call [employer] Customer Service at [phone] and use reference number [id number].   Thank you,  Dyane Nab Customer Support  Contacted patient at listed phone number. Aqsa Sensabaugh, MRN [id number] requested monitor to be mailed to : [address] [place], [place]  [address]  Message sent to [facility] support and [email or web address]

## 2024-06-22 ENCOUNTER — Other Ambulatory Visit (HOSPITAL_BASED_OUTPATIENT_CLINIC_OR_DEPARTMENT_OTHER): Payer: Self-pay

## 2024-06-24 ENCOUNTER — Ambulatory Visit: Payer: Self-pay | Admitting: Family Medicine

## 2024-06-25 NOTE — Progress Notes (Signed)
Patient has viewed via Mychart 

## 2024-06-29 NOTE — Progress Notes (Deleted)
  [facility] [address] [place] [address] Phone: [phone] Subjective:    I'm seeing this patient by the request  of:  Rilla Baller, MD  CC:   YEP:Dlagzrupcz  Michelle Rhodes is a 43 y.o. female coming in with complaint of back and neck pain. OMT on 05/01/2024. Patient states   Medications patient has been prescribed:   Taking:         Reviewed prior external information including notes and imaging from previsou exam, outside providers and external EMR if available.   As well as notes that were available from care everywhere and other healthcare systems.  Past medical history, social, surgical and family history all reviewed in electronic medical record.  No pertanent information unless stated regarding to the chief complaint.   Past Medical History:  Diagnosis Date   Anxiety    Asthma    as a child   Migraine     No Known Allergies   Review of Systems:  No headache, visual changes, nausea, vomiting, diarrhea, constipation, dizziness, abdominal pain, skin rash, fevers, chills, night sweats, weight loss, swollen lymph nodes, body aches, joint swelling, chest pain, shortness of breath, mood changes. POSITIVE muscle aches  Objective  There were no vitals taken for this visit.   General: No apparent distress alert and oriented x3 mood and affect normal, dressed appropriately.  HEENT: Pupils equal, extraocular movements intact  Respiratory: Patient's speak in full sentences and does not appear short of breath  Cardiovascular: No lower extremity edema, non tender, no erythema  Gait MSK:  Back   Osteopathic findings  C2 flexed rotated and side bent right C6 flexed rotated and side bent left T3 extended rotated and side bent right inhaled rib T9 extended rotated and side bent left L2 flexed rotated and side bent right Sacrum right on right       Assessment and Plan:  No problem-specific Assessment & Plan notes  found for this encounter.    Nonallopathic problems  Decision today to treat with OMT was based on Physical Exam  After verbal consent patient was treated with HVLA, ME, FPR techniques in cervical, rib, thoracic, lumbar, and sacral  areas  Patient tolerated the procedure well with improvement in symptoms  Patient given exercises, stretches and lifestyle modifications  See medications in patient instructions if given  Patient will follow up in 4-8 weeks             Note: This dictation was prepared with Dragon dictation along with smaller phrase technology. Any transcriptional errors that result from this process are unintentional.

## 2024-06-30 ENCOUNTER — Ambulatory Visit: Admitting: Family Medicine

## 2024-07-03 ENCOUNTER — Encounter: Payer: Self-pay | Admitting: Family Medicine

## 2024-07-15 ENCOUNTER — Other Ambulatory Visit: Payer: Self-pay | Admitting: Neurology

## 2024-07-16 ENCOUNTER — Other Ambulatory Visit: Payer: Self-pay

## 2024-07-16 ENCOUNTER — Other Ambulatory Visit (HOSPITAL_COMMUNITY): Payer: Self-pay

## 2024-07-16 MED ORDER — CYCLOBENZAPRINE HCL 10 MG PO TABS
10.0000 mg | ORAL_TABLET | Freq: Every day | ORAL | 0 refills | Status: AC
  Filled 2024-07-16 – 2024-08-02 (×2): qty 90, 90d supply, fill #0

## 2024-07-27 ENCOUNTER — Other Ambulatory Visit (HOSPITAL_COMMUNITY): Payer: Self-pay

## 2024-08-02 ENCOUNTER — Encounter: Payer: Self-pay | Admitting: Family Medicine

## 2024-08-02 ENCOUNTER — Other Ambulatory Visit: Payer: Self-pay | Admitting: Family Medicine

## 2024-08-03 ENCOUNTER — Telehealth: Payer: Self-pay | Admitting: Family Medicine

## 2024-08-03 ENCOUNTER — Ambulatory Visit: Admitting: Family Medicine

## 2024-08-03 ENCOUNTER — Other Ambulatory Visit: Payer: Self-pay

## 2024-08-03 ENCOUNTER — Other Ambulatory Visit (HOSPITAL_COMMUNITY): Payer: Self-pay

## 2024-08-03 MED ORDER — ONDANSETRON HCL 4 MG PO TABS
4.0000 mg | ORAL_TABLET | Freq: Three times a day (TID) | ORAL | 0 refills | Status: AC | PRN
  Filled 2024-08-03: qty 20, 7d supply, fill #0

## 2024-08-03 NOTE — Telephone Encounter (Signed)
 Provider cancelled appointment due to patient has already been seen in 05/2024

## 2024-08-03 NOTE — Telephone Encounter (Signed)
 Okay to refill?

## 2024-08-03 NOTE — Telephone Encounter (Signed)
 Medication refill was sent to us . This was filled by Dr. Mahlon as a temp refill.

## 2024-08-04 ENCOUNTER — Other Ambulatory Visit: Payer: Self-pay

## 2024-08-05 ENCOUNTER — Other Ambulatory Visit: Payer: Self-pay

## 2024-08-05 ENCOUNTER — Other Ambulatory Visit (HOSPITAL_COMMUNITY): Payer: Self-pay

## 2024-08-05 NOTE — Progress Notes (Signed)
 Specialty Pharmacy Refill Coordination Note  Michelle Rhodes is a 43 y.o. female contacted today regarding refills of specialty medication(s) OnabotulinumtoxinA  (Botox )   Patient requested Courier to Provider Office   Delivery date: 08/11/24   Verified address: [address], [place], [place] [address], [facility]   Medication will be filled on: 08/10/24

## 2024-08-10 ENCOUNTER — Other Ambulatory Visit: Payer: Self-pay

## 2024-08-12 ENCOUNTER — Encounter: Payer: Self-pay | Admitting: Family Medicine

## 2024-08-12 ENCOUNTER — Other Ambulatory Visit (HOSPITAL_COMMUNITY): Payer: Self-pay

## 2024-08-12 ENCOUNTER — Other Ambulatory Visit (HOSPITAL_BASED_OUTPATIENT_CLINIC_OR_DEPARTMENT_OTHER): Payer: Self-pay

## 2024-08-12 DIAGNOSIS — G43009 Migraine without aura, not intractable, without status migrainosus: Secondary | ICD-10-CM

## 2024-08-12 MED ORDER — NURTEC 75 MG PO TBDP: 75.0000 mg | ORAL_TABLET | Freq: Every day | ORAL | 11 refills | Status: DC | PRN

## 2024-08-12 MED ORDER — ALPRAZOLAM 0.5 MG PO TABS
0.2500 mg | ORAL_TABLET | Freq: Two times a day (BID) | ORAL | 0 refills | Status: AC | PRN
  Filled 2024-08-12: qty 10, 5d supply, fill #0

## 2024-08-12 MED ORDER — NURTEC 75 MG PO TBDP
75.0000 mg | ORAL_TABLET | Freq: Every day | ORAL | 11 refills | Status: AC | PRN
  Filled 2024-08-12: qty 16, 16d supply, fill #0

## 2024-08-12 NOTE — Addendum Note (Signed)
 Addended by: ONEITA HOIST E on: 08/12/2024 12:43 PM   Modules accepted: Orders

## 2024-08-14 ENCOUNTER — Other Ambulatory Visit: Payer: Self-pay

## 2024-08-18 ENCOUNTER — Ambulatory Visit: Admitting: Family Medicine

## 2024-08-18 ENCOUNTER — Encounter: Payer: Self-pay | Admitting: Family Medicine

## 2024-08-18 DIAGNOSIS — G43009 Migraine without aura, not intractable, without status migrainosus: Secondary | ICD-10-CM

## 2024-08-18 DIAGNOSIS — G43E09 Chronic migraine with aura, not intractable, without status migrainosus: Secondary | ICD-10-CM | POA: Diagnosis not present

## 2024-08-18 MED ORDER — ONABOTULINUMTOXINA 200 UNITS IJ SOLR
155.0000 [IU] | Freq: Once | INTRAMUSCULAR | Status: AC
  Administered 2024-08-18: 125 [IU] via INTRAMUSCULAR

## 2024-08-18 NOTE — Progress Notes (Signed)
 Botox - 100 units x 1 vial Lot: [id number] Expiration: 2027/06 NDC: 9976-8854-98  Botox - 100 units x 1 vial Lot: [id number] Expiration: 2027/06 NDC: 9976-8854-98   Bacteriostatic 0.9% Sodium Chloride - 2 mL  Lot: [id number] Expiration: 2025/11/10 NDC: 9590-8033-97  Dx: G43.711 S/P  Witnessed by Buddy Dustman

## 2024-08-18 NOTE — Progress Notes (Signed)
 "   08/18/2024 ALL: Michelle Rhodes returns for Botox . She reports doing well. Migraines had worsened in 05/2024 but have seemed to improve over the past few weeks. She feels weather changes and stressors could have contributed. She continues with work with Dr Claudene and requests to omit trapezius injections.   05/21/2024 ALL: Michelle Rhodes presents for Botox . She continues topiramate  100mg  daily and Nurtec or sumatriptan  injections as needed. She request to omit trapezius injections and add masseter injections.     Consent Form Botulism Toxin Injection For Chronic Migraine    Reviewed orally with patient, additionally signature is on file:  Botulism toxin has been approved by the Federal drug administration for treatment of chronic migraine. Botulism toxin does not cure chronic migraine and it may not be effective in some patients.  The administration of botulism toxin is accomplished by injecting a small amount of toxin into the muscles of the neck and head. Dosage must be titrated for each individual. Any benefits resulting from botulism toxin tend to wear off after 3 months with a repeat injection required if benefit is to be maintained. Injections are usually done every 3-4 months with maximum effect peak achieved by about 2 or 3 weeks. Botulism toxin is expensive and you should be sure of what costs you will incur resulting from the injection.  The side effects of botulism toxin use for chronic migraine may include:   -Transient, and usually mild, facial weakness with facial injections  -Transient, and usually mild, head or neck weakness with head/neck injections  -Reduction or loss of forehead facial animation due to forehead muscle weakness  -Eyelid drooping  -Dry eye  -Pain at the site of injection or bruising at the site of injection  -Double vision  -Potential unknown long term risks   Contraindications: You should not have Botox  if you are pregnant, nursing, allergic to albumin, have an  infection, skin condition, or muscle weakness at the site of the injection, or have myasthenia gravis, Lambert-Eaton syndrome, or ALS.  It is also possible that as with any injection, there may be an allergic reaction or no effect from the medication. Reduced effectiveness after repeated injections is sometimes seen and rarely infection at the injection site may occur. All care will be taken to prevent these side effects. If therapy is given over a long time, atrophy and wasting in the muscle injected may occur. Occasionally the patient's become refractory to treatment because they develop antibodies to the toxin. In this event, therapy needs to be modified.  I have read the above information and consent to the administration of botulism toxin.    BOTOX  PROCEDURE NOTE FOR MIGRAINE HEADACHE  Contraindications and precautions discussed with patient(above). Aseptic procedure was observed and patient tolerated procedure. Procedure performed by Greig Forbes, FNP-C.   The condition has existed for more than 6 months, and pt does not have a diagnosis of ALS, Myasthenia Gravis or Lambert-Eaton Syndrome.  Risks and benefits of injections discussed and pt agrees to proceed with the procedure.  Written consent obtained  These injections are medically necessary. Pt  receives good benefits from these injections. These injections do not cause sedations or hallucinations which the oral therapies may cause.   Description of procedure:  The patient was placed in a sitting position. The standard protocol was used for Botox  as follows, with 5 units of Botox  injected at each site:  -Procerus muscle, midline injection  -Corrugator muscle, bilateral injection  -Frontalis muscle, bilateral injection, with 2 sites each  side, medial injection was performed in the upper one third of the frontalis muscle, in the region vertical from the medial inferior edge of the superior orbital rim. The lateral injection was again in  the upper one third of the forehead vertically above the lateral limbus of the cornea, 1.5 cm lateral to the medial injection site.  -Temporalis muscle injection, 4 sites, bilaterally. The first injection was 3 cm above the tragus of the ear, second injection site was 1.5 cm to 3 cm up from the first injection site in line with the tragus of the ear. The third injection site was 1.5-3 cm forward between the first 2 injection sites. The fourth injection site was 1.5 cm posterior to the second injection site. 5th site laterally in the temporalis  muscleat the level of the outer canthus.  -Occipitalis muscle injection, 3 sites, bilaterally. The first injection was done one half way between the occipital protuberance and the tip of the mastoid process behind the ear. The second injection site was done lateral and superior to the first, 1 fingerbreadth from the first injection. The third injection site was 1 fingerbreadth superiorly and medially from the first injection site.  -Cervical paraspinal muscle injection, 2 sites, bilaterally. The first injection site was 1 cm from the midline of the cervical spine, 3 cm inferior to the lower border of the occipital protuberance. The second injection site was 1.5 cm superiorly and laterally to the first injection site.    Will return for repeat injection in 3 months.   A total of 200 units of Botox  was prepared, 125 units of Botox  was injected as documented above, any Botox  not injected was wasted. The patient tolerated the procedure well, there were no complications of the above procedure.  "

## 2024-08-24 ENCOUNTER — Other Ambulatory Visit (HOSPITAL_BASED_OUTPATIENT_CLINIC_OR_DEPARTMENT_OTHER): Payer: Self-pay

## 2024-11-10 ENCOUNTER — Ambulatory Visit: Admitting: Family Medicine

## 2025-05-04 ENCOUNTER — Encounter: Admitting: Family Medicine
